# Patient Record
Sex: Female | Born: 1945 | Race: Black or African American | Hispanic: No | Smoking: Current every day smoker
Health system: Southern US, Community
[De-identification: ages and names within clinical notes are randomized; demographics above are authoritative.]

## PROBLEM LIST (undated history)

## (undated) DIAGNOSIS — I10 Essential (primary) hypertension: Secondary | ICD-10-CM

## (undated) DIAGNOSIS — E119 Type 2 diabetes mellitus without complications: Secondary | ICD-10-CM

## (undated) DIAGNOSIS — I358 Other nonrheumatic aortic valve disorders: Secondary | ICD-10-CM

## (undated) DIAGNOSIS — I517 Cardiomegaly: Secondary | ICD-10-CM

## (undated) DIAGNOSIS — N184 Chronic kidney disease, stage 4 (severe): Secondary | ICD-10-CM

## (undated) DIAGNOSIS — I5189 Other ill-defined heart diseases: Secondary | ICD-10-CM

## (undated) DIAGNOSIS — N189 Chronic kidney disease, unspecified: Secondary | ICD-10-CM

## (undated) HISTORY — DX: Other ill-defined heart diseases: I51.89

## (undated) HISTORY — DX: Chronic kidney disease, unspecified: N18.9

## (undated) HISTORY — PX: BREAST SURGERY: SHX581

## (undated) HISTORY — PX: APPENDECTOMY: SHX54

## (undated) HISTORY — DX: Essential (primary) hypertension: I10

## (undated) HISTORY — DX: Type 2 diabetes mellitus without complications: E11.9

## (undated) HISTORY — DX: Chronic kidney disease, stage 4 (severe): N18.4

## (undated) HISTORY — DX: Other nonrheumatic aortic valve disorders: I35.8

## (undated) HISTORY — DX: Cardiomegaly: I51.7

## (undated) HISTORY — PX: THYROID SURGERY: SHX805

## (undated) HISTORY — PX: CARPAL TUNNEL RELEASE: SHX101

## (undated) HISTORY — PX: REPLACEMENT TOTAL KNEE: SUR1224

---

## 2021-05-15 ENCOUNTER — Observation Stay (HOSPITAL_COMMUNITY)
Admission: EM | Admit: 2021-05-15 | Discharge: 2021-05-16 | Disposition: A | Discharge status: Home / Self Care | Payer: Medicare HMO | Attending: Emergency Medicine | Admitting: Emergency Medicine

## 2021-05-15 ENCOUNTER — Ambulatory Visit (INDEPENDENT_AMBULATORY_CARE_PROVIDER_SITE_OTHER): Payer: Medicare HMO | Admitting: Nurse Practitioner

## 2021-05-15 ENCOUNTER — Other Ambulatory Visit: Payer: Self-pay

## 2021-05-15 ENCOUNTER — Encounter: Payer: Self-pay | Admitting: Nurse Practitioner

## 2021-05-15 ENCOUNTER — Encounter (HOSPITAL_COMMUNITY): Payer: Self-pay | Admitting: *Deleted

## 2021-05-15 ENCOUNTER — Emergency Department (HOSPITAL_COMMUNITY): Payer: Medicare HMO

## 2021-05-15 DIAGNOSIS — R7989 Other specified abnormal findings of blood chemistry: Secondary | ICD-10-CM | POA: Diagnosis not present

## 2021-05-15 DIAGNOSIS — I10 Essential (primary) hypertension: Secondary | ICD-10-CM

## 2021-05-15 DIAGNOSIS — R2681 Unsteadiness on feet: Secondary | ICD-10-CM | POA: Insufficient documentation

## 2021-05-15 DIAGNOSIS — R519 Headache, unspecified: Secondary | ICD-10-CM | POA: Diagnosis not present

## 2021-05-15 DIAGNOSIS — E119 Type 2 diabetes mellitus without complications: Secondary | ICD-10-CM

## 2021-05-15 DIAGNOSIS — I169 Hypertensive crisis, unspecified: Secondary | ICD-10-CM | POA: Diagnosis not present

## 2021-05-15 DIAGNOSIS — I129 Hypertensive chronic kidney disease with stage 1 through stage 4 chronic kidney disease, or unspecified chronic kidney disease: Secondary | ICD-10-CM | POA: Insufficient documentation

## 2021-05-15 DIAGNOSIS — Z20822 Contact with and (suspected) exposure to covid-19: Secondary | ICD-10-CM | POA: Diagnosis not present

## 2021-05-15 DIAGNOSIS — Z720 Tobacco use: Secondary | ICD-10-CM | POA: Diagnosis present

## 2021-05-15 DIAGNOSIS — F17210 Nicotine dependence, cigarettes, uncomplicated: Secondary | ICD-10-CM | POA: Insufficient documentation

## 2021-05-15 DIAGNOSIS — E1122 Type 2 diabetes mellitus with diabetic chronic kidney disease: Secondary | ICD-10-CM | POA: Insufficient documentation

## 2021-05-15 DIAGNOSIS — F17200 Nicotine dependence, unspecified, uncomplicated: Secondary | ICD-10-CM | POA: Diagnosis present

## 2021-05-15 DIAGNOSIS — G9341 Metabolic encephalopathy: Secondary | ICD-10-CM | POA: Diagnosis present

## 2021-05-15 DIAGNOSIS — N1832 Chronic kidney disease, stage 3b: Secondary | ICD-10-CM | POA: Diagnosis present

## 2021-05-15 DIAGNOSIS — E010 Iodine-deficiency related diffuse (endemic) goiter: Secondary | ICD-10-CM | POA: Diagnosis present

## 2021-05-15 LAB — CBC WITH DIFFERENTIAL/PLATELET
Abs Immature Granulocytes: 0.01 10*3/uL (ref 0.00–0.07)
Basophils Absolute: 0 10*3/uL (ref 0.0–0.1)
Basophils Relative: 1 %
Eosinophils Absolute: 0.1 10*3/uL (ref 0.0–0.5)
Eosinophils Relative: 2 %
HCT: 44 % (ref 36.0–46.0)
Hemoglobin: 13 g/dL (ref 12.0–15.0)
Immature Granulocytes: 0 %
Lymphocytes Relative: 28 %
Lymphs Abs: 1.7 10*3/uL (ref 0.7–4.0)
MCH: 27.4 pg (ref 26.0–34.0)
MCHC: 29.5 g/dL — ABNORMAL LOW (ref 30.0–36.0)
MCV: 92.6 fL (ref 80.0–100.0)
Monocytes Absolute: 0.5 10*3/uL (ref 0.1–1.0)
Monocytes Relative: 8 %
Neutro Abs: 3.7 10*3/uL (ref 1.7–7.7)
Neutrophils Relative %: 61 %
Platelets: 215 10*3/uL (ref 150–400)
RBC: 4.75 MIL/uL (ref 3.87–5.11)
RDW: 13.8 % (ref 11.5–15.5)
WBC: 6 10*3/uL (ref 4.0–10.5)
nRBC: 0 % (ref 0.0–0.2)

## 2021-05-15 LAB — COMPREHENSIVE METABOLIC PANEL
ALT: 17 U/L (ref 0–44)
AST: 20 U/L (ref 15–41)
Albumin: 3.5 g/dL (ref 3.5–5.0)
Alkaline Phosphatase: 85 U/L (ref 38–126)
Anion gap: 6 (ref 5–15)
BUN: 20 mg/dL (ref 8–23)
CO2: 29 mmol/L (ref 22–32)
Calcium: 8.9 mg/dL (ref 8.9–10.3)
Chloride: 108 mmol/L (ref 98–111)
Creatinine, Ser: 1.65 mg/dL — ABNORMAL HIGH (ref 0.44–1.00)
GFR, Estimated: 32 mL/min — ABNORMAL LOW (ref >60)
Glucose, Bld: 112 mg/dL — ABNORMAL HIGH (ref 70–99)
Potassium: 4.3 mmol/L (ref 3.5–5.1)
Sodium: 143 mmol/L (ref 135–145)
Total Bilirubin: 0.1 mg/dL — ABNORMAL LOW (ref 0.3–1.2)
Total Protein: 6.7 g/dL (ref 6.5–8.1)

## 2021-05-15 MED ORDER — NICARDIPINE HCL IN NACL 20-0.86 MG/200ML-% IV SOLN
3.0000 mg/h | INTRAVENOUS | Status: DC
  Administered 2021-05-16: 5 mg/h via INTRAVENOUS
  Filled 2021-05-15: qty 200

## 2021-05-15 MED ORDER — HYDRALAZINE HCL 25 MG PO TABS
25.0000 mg | ORAL_TABLET | Freq: Once | ORAL | Status: AC
  Administered 2021-05-15: 25 mg via ORAL
  Filled 2021-05-15: qty 1

## 2021-05-15 MED ORDER — AMLODIPINE BESYLATE 5 MG PO TABS
5.0000 mg | ORAL_TABLET | Freq: Once | ORAL | Status: AC
  Administered 2021-05-15: 5 mg via ORAL
  Filled 2021-05-15: qty 1

## 2021-05-15 MED ORDER — CARVEDILOL 12.5 MG PO TABS: 12.5000 mg | ORAL_TABLET | Freq: Two times a day (BID) | ORAL | Status: DC

## 2021-05-15 MED ORDER — AMLODIPINE BESYLATE 5 MG PO TABS
10.0000 mg | ORAL_TABLET | Freq: Every day | ORAL | Status: DC
  Filled 2021-05-15: qty 2

## 2021-05-15 MED ORDER — LOSARTAN POTASSIUM 25 MG PO TABS: 50.0000 mg | ORAL_TABLET | Freq: Every day | ORAL | Status: DC

## 2021-05-15 MED ORDER — HYDRALAZINE HCL 25 MG PO TABS
50.0000 mg | ORAL_TABLET | Freq: Three times a day (TID) | ORAL | Status: DC
  Administered 2021-05-16: 50 mg via ORAL
  Filled 2021-05-15: qty 2

## 2021-05-15 MED ORDER — DIPHENHYDRAMINE HCL 50 MG/ML IJ SOLN
25.0000 mg | Freq: Once | INTRAMUSCULAR | Status: AC
  Administered 2021-05-16: 25 mg via INTRAVENOUS
  Filled 2021-05-15: qty 1

## 2021-05-15 MED ORDER — ACETAMINOPHEN 325 MG PO TABS
650.0000 mg | ORAL_TABLET | Freq: Four times a day (QID) | ORAL | Status: DC | PRN
  Administered 2021-05-15: 650 mg via ORAL
  Filled 2021-05-15: qty 2

## 2021-05-15 MED ORDER — METOCLOPRAMIDE HCL 5 MG/ML IJ SOLN
10.0000 mg | Freq: Once | INTRAMUSCULAR | Status: AC
  Administered 2021-05-16: 10 mg via INTRAVENOUS
  Filled 2021-05-15: qty 2

## 2021-05-15 MED ORDER — HYDRALAZINE HCL 20 MG/ML IJ SOLN
10.0000 mg | Freq: Once | INTRAMUSCULAR | Status: AC
  Administered 2021-05-15: 10 mg via INTRAVENOUS
  Filled 2021-05-15: qty 1

## 2021-05-15 NOTE — ED Triage Notes (Signed)
C/o headache , sent from PCP for evaluation of blood pressure. Patient has not ben taking medication and has just changed doctors

## 2021-05-15 NOTE — Progress Notes (Signed)
   New Patient Office Visit  Subjective:  Patient ID: Deborah Gomez, female    DOB: 30-Sep-1946  Age: 75 y.o. MRN: [id number]  CC:  Chief Complaint  Patient presents with   New Patient (Initial Visit)    Here to establish care, no complaints today, just needs refills.    HPI Deborah Gomez presents for new patient visit. Pt is having headaches and BP is significantly elevated.   Will complete new patient visit after her BP is stabilized. Recommend treatment at the Emergency Department today  No charge for the visit.    No past medical history on file.    No family history on file.  Social History   Socioeconomic History   Marital status: Widowed    Spouse name: Not on file   Number of children: Not on file   Years of education: Not on file   Highest education level: Not on file  Occupational History   Not on file  Tobacco Use   Smoking status: Every Day    Pack years: 0.00    Types: Cigarettes   Smokeless tobacco: Never  Substance and Sexual Activity   Alcohol use: Not on file   Drug use: Not on file   Sexual activity: Not on file  Other Topics Concern   Not on file  Social History Narrative   Not on file   Social Determinants of Health   Financial Resource Strain: Not on file  Food Insecurity: Not on file  Transportation Needs: Not on file  Physical Activity: Not on file  Stress: Not on file  Social Connections: Not on file  Intimate Partner Violence: Not on file    ROS Review of Systems  Objective:   Today's Vitals: BP (!) 232/92 (BP Location: Right Arm, Patient Position: Sitting, Cuff Size: Normal)   Pulse 85   Temp (!) 97.4 F (36.3 C) (Temporal)   Ht '5\' 4"'$  (1.626 m)   Wt 169 lb (76.7 kg)   SpO2 98%   BMI 29.01 kg/m   Physical Exam  Assessment & Plan:   Problem List Items Addressed This Visit   None   No outpatient encounter medications on file as of 05/15/2021.   No facility-administered encounter medications on file as of  05/15/2021.    Follow-up: No follow-ups on file.   Noreene Larsson, NP

## 2021-05-15 NOTE — ED Provider Notes (Signed)
[facility] Provider Note   CSN: [id number] Arrival date & time: 05/15/21  1437     History No chief complaint on file.   Deborah Gomez is a 75 y.o. female.  Pt reports she is not taking her blood pressure medication.  Pt reports she went to see an MD today.  Pt sent here for evaluation by primary care.  Pt complains of a headache.  Pt denies chest pain    Hypertension This is a new problem. The current episode started more than 1 week ago. The problem occurs constantly. Nothing aggravates the symptoms. Nothing relieves the symptoms. She has tried nothing for the symptoms. The treatment provided no relief.      Past Medical History:  Diagnosis Date  . Chronic kidney disease   . Diabetes mellitus without complication ([place])   . Hypertension     Patient Active Problem List   Diagnosis Date Noted  . HTN (hypertension), malignant 05/15/2021    Past Surgical History:  Procedure Laterality Date  . APPENDECTOMY    . REPLACEMENT TOTAL KNEE    . THYROID SURGERY       OB History   No obstetric history on file.     No family history on file.  Social History   Tobacco Use  . Smoking status: Every Day    Pack years: 0.00    Types: Cigarettes  . Smokeless tobacco: Never    Home Medications Prior to Admission medications   Not on File    Allergies    Patient has no allergy information on record.  Review of Systems   Review of Systems  All other systems reviewed and are negative.  Physical Exam Updated Vital Signs BP (!) 264/87 (BP Location: Right Arm)   Pulse 74   Temp 98.1 F (36.7 C)   Resp 16   Ht '5\' 4"'$  (1.626 m)   Wt 76.7 kg   SpO2 100%   BMI 29.01 kg/m   Physical Exam Vitals and nursing note reviewed.  Constitutional:      Appearance: She is well-developed.  HENT:     Head: Normocephalic.  Cardiovascular:     Rate and Rhythm: Normal rate and regular rhythm.  Pulmonary:     Effort: Pulmonary effort is normal.      Breath sounds: Normal breath sounds.  Abdominal:     General: There is no distension.  Musculoskeletal:        General: Normal range of motion.     Cervical back: Normal range of motion.  Neurological:     Mental Status: She is alert and oriented to person, place, and time.  Psychiatric:        Mood and Affect: Mood normal.    ED Results / Procedures / Treatments   Labs (all labs ordered are listed, but only abnormal results are displayed) Labs Reviewed  CBC WITH DIFFERENTIAL/PLATELET - Abnormal; Notable for the following components:      Result Value   MCHC 29.5 (*)    All other components within normal limits  COMPREHENSIVE METABOLIC PANEL - Abnormal; Notable for the following components:   Glucose, Bld 112 (*)    Creatinine, Ser 1.65 (*)    Total Bilirubin 0.1 (*)    GFR, Estimated 32 (*)    All other components within normal limits    EKG EKG Interpretation  Date/Time:  Monday May 15 2021 15:31:43 EDT Ventricular Rate:  62 PR Interval:  144 QRS Duration: 70 QT  Interval:  402 QTC Calculation: 408 R Axis:   -3 Text Interpretation: Normal sinus rhythm Cannot rule out Anterior infarct , age undetermined Abnormal ECG No previous ECGs available Confirmed by Fredia Sorrow [phone]) on 05/15/2021 5:46:20 PM  Radiology No results found.  Procedures Procedures   Medications Ordered in ED Medications  hydrALAZINE (APRESOLINE) tablet 25 mg (25 mg Oral Given 05/15/21 1710)    ED Course  I have reviewed the triage vital signs and the nursing notes.  Pertinent labs & imaging results that were available during my care of the patient were reviewed by me and considered in my medical decision making (see chart for details).    MDM Rules/Calculators/A&P                          MDM:   Final Clinical Impression(s) / ED Diagnoses Final diagnoses:  Hypertension, unspecified type  Acute nonintractable headache, unspecified headache type  Systolic essential hypertension     Rx / DC Orders ED Discharge Orders     None        Fransico Meadow, [place] 05/16/21 [id number]    Fredia Sorrow, MD 05/18/21 1600

## 2021-05-15 NOTE — Discharge Instructions (Addendum)
IMPORTANT INFORMATION: PAY CLOSE ATTENTION   PHYSICIAN DISCHARGE INSTRUCTIONS  Follow with Primary care provider  Noreene Larsson, NP  and other consultants as instructed by your Hospitalist Physician  [place] IF SYMPTOMS COME BACK, WORSEN OR NEW PROBLEM DEVELOPS   Please note: You were cared for by a hospitalist during your hospital stay. Every effort will be made to forward records to your primary care provider.  You can request that your primary care provider send for your hospital records if they have not received them.  Once you are discharged, your primary care physician will handle any further medical issues. Please note that NO REFILLS for any discharge medications will be authorized once you are discharged, as it is imperative that you return to your primary care physician (or establish a relationship with a primary care physician if you do not have one) for your post hospital discharge needs so that they can reassess your need for medications and monitor your lab values.  Please get a complete blood count and chemistry panel checked by your Primary MD at your next visit, and again as instructed by your Primary MD.  Get Medicines reviewed and adjusted: Please take all your medications with you for your next visit with your Primary MD  Laboratory/radiological data: Please request your Primary MD to go over all hospital tests and procedure/radiological results at the follow up, please ask your primary care provider to get all Hospital records sent to his/her office.  In some cases, they will be blood work, cultures and biopsy results pending at the time of your discharge. Please request that your primary care provider follow up on these results.  If you are diabetic, please bring your blood sugar readings with you to your follow up appointment with primary care.    Please call and make your follow up appointments as soon as possible.    Also Note  the following: If you experience worsening of your admission symptoms, develop shortness of breath, life threatening emergency, suicidal or homicidal thoughts you must seek medical attention immediately by calling 911 or calling your MD immediately  if symptoms less severe.  You must read complete instructions/literature along with all the possible adverse reactions/side effects for all the Medicines you take and that have been prescribed to you. Take any new Medicines after you have completely understood and accpet all the possible adverse reactions/side effects.   Do not drive when taking Pain medications or sleeping medications (Benzodiazepines)  Do not take more than prescribed Pain, Sleep and Anxiety Medications. It is not advisable to combine anxiety,sleep and pain medications without talking with your primary care practitioner  Special Instructions: If you have smoked or chewed Tobacco  in the last 2 yrs please stop smoking, stop any regular Alcohol  and or any Recreational drug use.  Wear Seat belts while driving.  Do not drive if taking any narcotic, mind altering or controlled substances or recreational drugs or alcohol.

## 2021-05-15 NOTE — ED Provider Notes (Signed)
Emergency Medicine Provider Triage Evaluation Note  Deborah Gomez , a 75 y.o. female  was evaluated in triage.  Pt complains of high blood pressure.  Pt has not take blood pressure medications recently.  Pt went to [facility] primary and was sent here due to high blood pressure.  Pt reports she moved here and did not have an MD to fill her medications   Review of Systems  Positive: headahe Negative: Chest pain  Physical Exam  BP (!) 257/84 (BP Location: Right Arm)   Pulse 67   Temp 98.1 F (36.7 C) (Oral)   Resp 20   Ht '5\' 4"'$  (1.626 m)   Wt 76.7 kg   SpO2 96%   BMI 29.01 kg/m  Gen:   Awake, no distress   Resp:  Normal effort  MSK:   Moves extremities without difficulty  Other:    Medical Decision Making  Medically screening exam initiated at 4:10 PM.  Appropriate orders placed.  Deborah Gomez was informed that the remainder of the evaluation will be completed by another provider, this initial triage assessment does not replace that evaluation, and the importance of remaining in the ED until their evaluation is complete.     Fransico Meadow, [place] 05/15/21 1612    Fredia Sorrow, MD 05/18/21 [phone]

## 2021-05-15 NOTE — Assessment & Plan Note (Signed)
BP Readings from Last 1 Encounters:  05/15/21 (!) 232/92   -recommend getting treatment at ED today for extreme HTN

## 2021-05-16 DIAGNOSIS — E010 Iodine-deficiency related diffuse (endemic) goiter: Secondary | ICD-10-CM | POA: Diagnosis present

## 2021-05-16 DIAGNOSIS — N1832 Chronic kidney disease, stage 3b: Secondary | ICD-10-CM | POA: Diagnosis present

## 2021-05-16 DIAGNOSIS — I169 Hypertensive crisis, unspecified: Principal | ICD-10-CM

## 2021-05-16 DIAGNOSIS — R7989 Other specified abnormal findings of blood chemistry: Secondary | ICD-10-CM | POA: Diagnosis present

## 2021-05-16 DIAGNOSIS — G9341 Metabolic encephalopathy: Secondary | ICD-10-CM | POA: Diagnosis present

## 2021-05-16 DIAGNOSIS — Z720 Tobacco use: Secondary | ICD-10-CM | POA: Diagnosis present

## 2021-05-16 DIAGNOSIS — F17200 Nicotine dependence, unspecified, uncomplicated: Secondary | ICD-10-CM | POA: Diagnosis present

## 2021-05-16 DIAGNOSIS — E119 Type 2 diabetes mellitus without complications: Secondary | ICD-10-CM

## 2021-05-16 LAB — TROPONIN I (HIGH SENSITIVITY): Troponin I (High Sensitivity): 11 ng/L (ref <18)

## 2021-05-16 LAB — SARS CORONAVIRUS 2 (TAT 6-24 HRS): SARS Coronavirus 2: NEGATIVE

## 2021-05-16 LAB — TSH: TSH: 1.895 u[IU]/mL (ref 0.350–4.500)

## 2021-05-16 MED ORDER — HYDRALAZINE HCL 20 MG/ML IJ SOLN: 5.0000 mg | Freq: Four times a day (QID) | INTRAMUSCULAR | Status: DC | PRN

## 2021-05-16 MED ORDER — AMLODIPINE BESYLATE 5 MG PO TABS: 5.0000 mg | ORAL_TABLET | Freq: Every day | ORAL | 1 refills | Status: DC

## 2021-05-16 MED ORDER — CARVEDILOL 3.125 MG PO TABS: 3.1250 mg | ORAL_TABLET | Freq: Two times a day (BID) | ORAL | 1 refills | Status: DC

## 2021-05-16 MED ORDER — ACETAMINOPHEN 650 MG RE SUPP: 650.0000 mg | Freq: Four times a day (QID) | RECTAL | Status: DC | PRN

## 2021-05-16 MED ORDER — CARVEDILOL 3.125 MG PO TABS
6.2500 mg | ORAL_TABLET | Freq: Two times a day (BID) | ORAL | Status: DC
  Administered 2021-05-16: 6.25 mg via ORAL
  Filled 2021-05-16: qty 2

## 2021-05-16 MED ORDER — ONDANSETRON HCL 4 MG/2ML IJ SOLN: 4.0000 mg | Freq: Four times a day (QID) | INTRAMUSCULAR | Status: DC | PRN

## 2021-05-16 MED ORDER — ENOXAPARIN SODIUM 30 MG/0.3ML IJ SOSY
30.0000 mg | PREFILLED_SYRINGE | INTRAMUSCULAR | Status: DC
  Administered 2021-05-16: 30 mg via SUBCUTANEOUS
  Filled 2021-05-16: qty 0.3

## 2021-05-16 MED ORDER — NICOTINE 14 MG/24HR TD PT24
14.0000 mg | MEDICATED_PATCH | Freq: Every day | TRANSDERMAL | Status: DC
  Filled 2021-05-16: qty 1

## 2021-05-16 MED ORDER — LOSARTAN POTASSIUM 50 MG PO TABS
50.0000 mg | ORAL_TABLET | Freq: Every day | ORAL | Status: DC
  Administered 2021-05-16: 50 mg via ORAL
  Filled 2021-05-16: qty 2

## 2021-05-16 MED ORDER — AMLODIPINE BESYLATE 5 MG PO TABS
5.0000 mg | ORAL_TABLET | Freq: Every day | ORAL | Status: DC
  Administered 2021-05-16: 5 mg via ORAL

## 2021-05-16 MED ORDER — HYDRALAZINE HCL 10 MG PO TABS
10.0000 mg | ORAL_TABLET | Freq: Three times a day (TID) | ORAL | 1 refills | Status: DC
Start: 2021-05-16 — End: 2021-07-20

## 2021-05-16 MED ORDER — HYDRALAZINE HCL 25 MG PO TABS: 25.0000 mg | ORAL_TABLET | Freq: Three times a day (TID) | ORAL | Status: DC

## 2021-05-16 MED ORDER — ONDANSETRON HCL 4 MG PO TABS: 4.0000 mg | ORAL_TABLET | Freq: Four times a day (QID) | ORAL | Status: DC | PRN

## 2021-05-16 MED ORDER — HYDROCODONE-ACETAMINOPHEN 5-325 MG PO TABS: 1.0000 | ORAL_TABLET | Freq: Four times a day (QID) | ORAL | Status: DC | PRN

## 2021-05-16 MED ORDER — ACETAMINOPHEN 325 MG PO TABS: 650.0000 mg | ORAL_TABLET | Freq: Four times a day (QID) | ORAL | Status: DC | PRN

## 2021-05-16 MED ORDER — HYDRALAZINE HCL 20 MG/ML IJ SOLN
10.0000 mg | Freq: Once | INTRAMUSCULAR | Status: AC
  Administered 2021-05-16: 10 mg via INTRAVENOUS
  Filled 2021-05-16: qty 1

## 2021-05-16 NOTE — H&P (Signed)
[facility] H&P    Patient Demographics:    Deborah Gomez, is a 75 y.o. female  MRN: [id number]  DOB - [birth year]  Admit Date - 05/15/2021  Referring MD/NP/PA: Threasa Alpha  Outpatient Primary MD for the patient is Noreene Larsson, NP  Patient coming from: Home  Chief complaint- Hypertension   HPI:    Deborah Gomez  is a 75 y.o. female, with history of diet-controlled diabetes mellitus type 2, hypertension, CKD with unknown baseline, partial thyroidectomy following goiter per patient, tobacco use disorder, presents to the ED with a chief complaint of hypertension.  Patient had moved to this area sometime between 6 months and 1 year ago.  She has not had her blood pressure medication since she moved to the area as she did not have a physician to prescribe them.  She went to establish with a PCP today, and when they took her blood pressure they told her she needed to come to the ED.  Patient reports associated headache, blurry vision, dizziness, and nausea.  She denies any chest pain or shortness of breath.  She denies any fevers.  She reports that the symptoms started today.  She is otherwise been in her normal state of health.  Patient has no other complaints at this time.  Patient does report that she has had a headache today as above.  The headache is on the left temporal parietal region.  She reports its constant achy, pressure it was gradual in onset.  Tylenol was given in the ED and she reports that that did not help.  She reports moderate severity of this headache.  She does not normally get headaches so she cannot say if this 1 is like her normal.  Patient currently smokes quarter of a pack a day, does not drink alcohol, does not use illicit drugs, is not vaccinated for COVID.  Patient is full code.  In the ED Afebrile, heart rate 57, respiratory rate 14-20, maintaining O2 sats on room air, blood pressure as high as  264/87 No leukocytosis, hemoglobin 13.0 Chemistry panel is mostly unremarkable aside from a elevated creatinine of 1.  6 5 EKG shows a heart rate of 62, sinus rhythm, QTC 408 Patient was given p.o. amlodipine and hydralazine, blood pressure improved to 192/71 She was then given IV hydralazine, and blood pressure was then systolic [id number] Cardene drip started at admission with gradual increases, blood pressure normalized, patient admitted to telemetry CT head was done that showed no acute intracranial abnormality  At admission Reglan and Benadryl were given for headache and headache was resolved    Review of systems:    In addition to the HPI above,  No Fever-chills, Admits to headache and blurry vision No problems swallowing food or Liquids, No Chest pain, Cough or Shortness of Breath, No Abdominal pain, admits to nausea but no vomiting, bowel movements are regular, No Blood in stool or Urine, No dysuria, No new skin rashes or bruises, No new joints pains-aches,  No new weakness, tingling, numbness in any extremity, No recent weight gain  or loss, No polyuria, polydypsia or polyphagia, No significant Mental Stressors.  All other systems reviewed and are negative.    Past History of the following :    Past Medical History:  Diagnosis Date   Chronic kidney disease    Diabetes mellitus without complication ([place])    Hypertension       Past Surgical History:  Procedure Laterality Date   APPENDECTOMY     REPLACEMENT TOTAL KNEE     THYROID SURGERY        Social History:      Social History   Tobacco Use   Smoking status: Every Day    Pack years: 0.00    Types: Cigarettes   Smokeless tobacco: Never  Substance Use Topics   Alcohol use: Not on file       Family History :     Family history hypertension   Home Medications:   Prior to Admission medications   Not on File     Allergies:    Not on File   Physical Exam:   Vitals  Blood pressure (!)  152/54, pulse 65, temperature 98.3 F (36.8 C), temperature source Oral, resp. rate 16, height '5\' 4"'$  (1.626 m), weight 76.7 kg, SpO2 98 %.  1.  General: Patient lying supine in bed,  no acute distress   2. Psychiatric: Alert and oriented x 3, mood and behavior normal for situation, pleasant and cooperative with exam, difficulty remembering events prior to presentation in the ED   3. Neurologic: Speech and language are normal, face is symmetric, moves all 4 extremities voluntarily, some confusion but oriented x3, no focal deficits  4. HEENMT:  Head is atraumatic, normocephalic, pupils reactive to light, neck is supple, trachea is midline, mucous membranes are moist   5. Respiratory : Lungs are clear to auscultation bilaterally without wheezing, rhonchi, rales, no cyanosis, no increase in work of breathing or accessory muscle use   6. Cardiovascular : Heart rate normal, rhythm is regular, systolic murmur, no rubs or gallops, no peripheral edema, peripheral pulses palpated   7. Gastrointestinal:  Abdomen is soft, nondistended, nontender to palpation bowel sounds active, no masses or organomegaly palpated   8. Skin:  Skin is warm, dry and intact without rashes, acute lesions, or ulcers on limited exam   9.Musculoskeletal:  No acute deformities or trauma, no asymmetry in tone, no peripheral edema, peripheral pulses palpated, no tenderness to palpation in the extremities    Data Review:    CBC Recent Labs  Lab 05/15/21 1622  WBC 6.0  HGB 13.0  HCT 44.0  PLT 215  MCV 92.6  MCH 27.4  MCHC 29.5*  RDW 13.8  LYMPHSABS 1.7  MONOABS 0.5  EOSABS 0.1  BASOSABS 0.0   ------------------------------------------------------------------------------------------------------------------  Results for orders placed or performed during the hospital encounter of 05/15/21 (from the past 48 hour(s))  CBC with Differential/Platelet     Status: Abnormal   Collection Time: 05/15/21  4:22 PM   Result Value Ref Range   WBC 6.0 4.0 - 10.5 K/uL   RBC 4.75 3.87 - 5.11 MIL/uL   Hemoglobin 13.0 12.0 - 15.0 g/dL   HCT 44.0 36.0 - 46.0 %   MCV 92.6 80.0 - 100.0 fL   MCH 27.4 26.0 - 34.0 pg   MCHC 29.5 (L) 30.0 - 36.0 g/dL   RDW 13.8 11.5 - 15.5 %   Platelets 215 150 - 400 K/uL   nRBC 0.0 0.0 - 0.2 %   Neutrophils  Relative % 61 %   Neutro Abs 3.7 1.7 - 7.7 K/uL   Lymphocytes Relative 28 %   Lymphs Abs 1.7 0.7 - 4.0 K/uL   Monocytes Relative 8 %   Monocytes Absolute 0.5 0.1 - 1.0 K/uL   Eosinophils Relative 2 %   Eosinophils Absolute 0.1 0.0 - 0.5 K/uL   Basophils Relative 1 %   Basophils Absolute 0.0 0.0 - 0.1 K/uL   Immature Granulocytes 0 %   Abs Immature Granulocytes 0.01 0.00 - 0.07 K/uL    Comment: Performed at [facility], [address] [place], [place] [address]  Comprehensive metabolic panel     Status: Abnormal   Collection Time: 05/15/21  4:22 PM  Result Value Ref Range   Sodium 143 135 - 145 mmol/L   Potassium 4.3 3.5 - 5.1 mmol/L   Chloride 108 98 - 111 mmol/L   CO2 29 22 - 32 mmol/L   Glucose, Bld 112 (H) 70 - 99 mg/dL    Comment: Glucose reference range applies only to samples taken after fasting for at least 8 hours.   BUN 20 8 - 23 mg/dL   Creatinine, Ser 1.65 (H) 0.44 - 1.00 mg/dL   Calcium 8.9 8.9 - 10.3 mg/dL   Total Protein 6.7 6.5 - 8.1 g/dL   Albumin 3.5 3.5 - 5.0 g/dL   AST 20 15 - 41 U/L   ALT 17 0 - 44 U/L   Alkaline Phosphatase 85 38 - 126 U/L   Total Bilirubin 0.1 (L) 0.3 - 1.2 mg/dL   GFR, Estimated 32 (L) >60 mL/min    Comment: (NOTE) Calculated using the CKD-EPI Creatinine Equation (2021)    Anion gap 6 5 - 15    Comment: Performed at [facility], [address], [place], [place] [address]    Chemistries  Recent Labs  Lab 05/15/21 1622  NA 143  K 4.3  CL 108  CO2 29  GLUCOSE 112*  BUN 20  CREATININE 1.65*  CALCIUM 8.9  AST 20  ALT 17  ALKPHOS 85  BILITOT 0.1*    ------------------------------------------------------------------------------------------------------------------  ------------------------------------------------------------------------------------------------------------------ GFR: Estimated Creatinine Clearance: 29.5 mL/min (A) (by C-G formula based on SCr of 1.65 mg/dL (H)). Liver Function Tests: Recent Labs  Lab 05/15/21 1622  AST 20  ALT 17  ALKPHOS 85  BILITOT 0.1*  PROT 6.7  ALBUMIN 3.5   No results for input(s): LIPASE, AMYLASE in the last 168 hours. No results for input(s): AMMONIA in the last 168 hours. Coagulation Profile: No results for input(s): INR, PROTIME in the last 168 hours. Cardiac Enzymes: No results for input(s): CKTOTAL, CKMB, CKMBINDEX, TROPONINI in the last 168 hours. BNP (last 3 results) No results for input(s): PROBNP in the last 8760 hours. HbA1C: No results for input(s): HGBA1C in the last 72 hours. CBG: No results for input(s): GLUCAP in the last 168 hours. Lipid Profile: No results for input(s): CHOL, HDL, LDLCALC, TRIG, CHOLHDL, LDLDIRECT in the last 72 hours. Thyroid Function Tests: No results for input(s): TSH, T4TOTAL, FREET4, T3FREE, THYROIDAB in the last 72 hours. Anemia Panel: No results for input(s): VITAMINB12, FOLATE, FERRITIN, TIBC, IRON, RETICCTPCT in the last 72 hours.  --------------------------------------------------------------------------------------------------------------- Urine analysis: No results found for: COLORURINE, APPEARANCEUR, LABSPEC, PHURINE, GLUCOSEU, HGBUR, BILIRUBINUR, KETONESUR, PROTEINUR, UROBILINOGEN, NITRITE, LEUKOCYTESUR    Imaging Results:    CT Head Wo Contrast  Result Date: 05/15/2021 CLINICAL DATA:  Headache EXAM: CT HEAD WITHOUT CONTRAST TECHNIQUE: Contiguous axial images were obtained from the base of  the skull through the vertex without intravenous contrast. COMPARISON:  None. FINDINGS: Brain: There is atrophy and chronic small vessel  disease changes. No acute intracranial abnormality. Specifically, no hemorrhage, hydrocephalus, mass lesion, acute infarction, or significant intracranial injury. Vascular: No hyperdense vessel or unexpected calcification. Skull: No acute calvarial abnormality. Sinuses/Orbits: Visualized paranasal sinuses and mastoids clear. Orbital soft tissues unremarkable. Other: None IMPRESSION: Atrophy, chronic microvascular disease. No acute intracranial abnormality. Electronically Signed   By: Rolm Baptise M.D.   On: 05/15/2021 21:56        Assessment & Plan:    Principal Problem:   Hypertensive crisis Active Problems:   Acute metabolic encephalopathy   DMII (diabetes mellitus, type 2) (HCC)   Elevated serum creatinine   Thyromegaly   Tobacco use disorder   Hypertensive crisis BP as high as 264/87 Medication non compliance Associated headache and confusion Amlodipine and hydralazine given PO - with only very temporary improvement of BP Cardene drip started temporarily in ED and improved BP to 150s Trop pending EKG without ischemic changes Cr elevated - but may be baseline Patient does not know home medications - she does know that she is on multiple BP meds Beta blocker, hydralazine, CCB, and ARB sent Continue to monitor Acute metabolic encephalopathy Patient is alert and oriented x 3 She is having difficulty talking events prior to presentation at ED repeating " I don't know why I am confused" Most likely 2/2 HTN CT head is without acute intracranial abnormality Continue to monitor DMII Not on medications currently Glucose controlled at 112 Trend on AM chemistry Elevated serum cr Cr elevated to 1.65 Patient knows she has CKD, but does not know her baseline Cr Will control BP and trend in the AM Continue to monitor 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

## 2021-05-16 NOTE — Evaluation (Signed)
Physical Therapy Evaluation Patient Details Name: Deborah Gomez MRN: [id number] DOB: 01-22-1946 Today's Date: 05/16/2021   History of Present Illness  Deborah Gomez  is a 75 y.o. female, with history of diet-controlled diabetes mellitus type 2, hypertension, CKD with unknown baseline, partial thyroidectomy following goiter per patient, tobacco use disorder, presents to the ED with a chief complaint of hypertension.  Patient had moved to this area sometime between 6 months and 1 year ago.  She has not had her blood pressure medication since she moved to the area as she did not have a physician to prescribe them.  She went to establish with a PCP today, and when they took her blood pressure they told her she needed to come to the ED.  Patient reports associated headache, blurry vision, dizziness, and nausea.  She denies any chest pain or shortness of breath.  She denies any fevers.  She reports that the symptoms started today.  She is otherwise been in her normal state of health.  Patient has no other complaints at this time.   Clinical Impression  Patient functioning near baseline for functional mobility and gait. Patient able to complete bed mobility and transfer independently without AD. She ambulates with slightly slow, unsteady cadence without use of AD but does not have loss of balance. Patient returned to bed at end of session. Patient discharged to care of nursing for ambulation daily as tolerated for length of stay.     Follow Up Recommendations No PT follow up    Equipment Recommendations  None recommended by PT    Recommendations for Other Services       Precautions / Restrictions Precautions Precautions: Fall Restrictions Weight Bearing Restrictions: No      Mobility  Bed Mobility Overal bed mobility: Independent                  Transfers Overall transfer level: Independent Equipment used: None             General transfer comment: slightly unsteady  upon initial standing  Ambulation/Gait Ambulation/Gait assistance: Min guard Gait Distance (Feet): 120 Feet Assistive device: None Gait Pattern/deviations: Step-through pattern;Decreased stride length     General Gait Details: slightly slow, unsteady cadence without AD; no loss of balance  Stairs            Wheelchair Mobility    Modified Rankin (Stroke Patients Only)       Balance Overall balance assessment: Needs assistance Sitting-balance support: No upper extremity supported Sitting balance-Leahy Scale: Normal Sitting balance - Comments: seated EOB   Standing balance support: No upper extremity supported Standing balance-Leahy Scale: Fair Standing balance comment: without AD                             Pertinent Vitals/Pain Pain Assessment: No/denies pain    Home Living Family/patient expects to be discharged to:: Private residence Living Arrangements: Alone Available Help at Discharge: Family Type of Home: Apartment Home Access: Level entry     Home Layout: One level Home Equipment: Environmental consultant - 2 wheels;Cane - single point      Prior Function Level of Independence: Independent         Comments: Patient states community ambulation, independent with ADL     Hand Dominance        Extremity/Trunk Assessment   Upper Extremity Assessment Upper Extremity Assessment: Overall WFL for tasks assessed    Lower Extremity Assessment Lower Extremity  Assessment: Overall WFL for tasks assessed    Cervical / Trunk Assessment Cervical / Trunk Assessment: Normal  Communication   Communication: No difficulties  Cognition Arousal/Alertness: Awake/alert Behavior During Therapy: WFL for tasks assessed/performed Overall Cognitive Status: Within Functional Limits for tasks assessed                                        General Comments      Exercises     Assessment/Plan    PT Assessment Patent does not need any further  PT services  PT Problem List         PT Treatment Interventions      PT Goals (Current goals can be found in the Care Plan section)  Acute Rehab PT Goals Patient Stated Goal: Return home PT Goal Formulation: With patient Time For Goal Achievement: 05/16/21 Potential to Achieve Goals: Good    Frequency     Barriers to discharge        Co-evaluation               AM-PAC PT "6 Clicks" Mobility  Outcome Measure Help needed turning from your back to your side while in a flat bed without using bedrails?: None Help needed moving from lying on your back to sitting on the side of a flat bed without using bedrails?: None Help needed moving to and from a bed to a chair (including a wheelchair)?: None Help needed standing up from a chair using your arms (e.g., wheelchair or bedside chair)?: None Help needed to walk in hospital room?: A Little Help needed climbing 3-5 steps with a railing? : A Little 6 Click Score: 22    End of Session Equipment Utilized During Treatment: Gait belt Activity Tolerance: Patient tolerated treatment well Patient left: in bed;with call bell/phone within reach Nurse Communication: Mobility status PT Visit Diagnosis: Other abnormalities of gait and mobility (R26.89);Unsteadiness on feet (R26.81)    Time: [id number] PT Time Calculation (min) (ACUTE ONLY): 11 min   Charges:   PT Evaluation $PT Eval Low Complexity: 1 Low          8:36 AM, 05/16/21 Mearl Latin PT, DPT Physical Therapist at [facility]

## 2021-05-16 NOTE — Discharge Summary (Addendum)
Physician Discharge Summary  Deborah Gomez [id number] DOB: 1946-09-16 DOA: 05/15/2021  PCP: Noreene Larsson, NP  Admit date: 05/15/2021 Discharge date: 05/16/2021  Admitted From:  Home  Disposition: Home   Recommendations for Outpatient Follow-up:  Follow up with PCP in 3-5 days to check BP Please adjust and titrated BP meds as needed  Please establish care with Dr. Theador Hawthorne regarding CKD in next 1 month Please obtain BMP in 1-2 weeks to follow renal function and electrolytes  Discharge Condition: STABLE   CODE STATUS: FULL DIET: heart healthy/carb modified recommended   Brief Hospitalization Summary: Please see all hospital notes, images, labs for full details of the hospitalization. ADMISSION HPI:  Deborah Gomez  is a 75 y.o. female, with history of diet-controlled diabetes mellitus type 2, hypertension, CKD with unknown baseline, partial thyroidectomy following goiter per patient, tobacco use disorder, presents to the ED with a chief complaint of hypertension.  Patient had moved to this area sometime between 6 months and 1 year ago.  She has not had her blood pressure medication since she moved to the area as she did not have a physician to prescribe them.  She went to establish with a PCP today, and when they took her blood pressure they told her she needed to come to the ED.  Patient reports associated headache, blurry vision, dizziness, and nausea.  She denies any chest pain or shortness of breath.  She denies any fevers.  She reports that the symptoms started today.  She is otherwise been in her normal state of health.  Patient has no other complaints at this time.   Patient does report that she has had a headache today as above.  The headache is on the left temporal parietal region.  She reports its constant achy, pressure it was gradual in onset.  Tylenol was given in the ED and she reports that that did not help.  She reports moderate severity of this headache.  She does not  normally get headaches so she cannot say if this 1 is like her normal.   Patient currently smokes quarter of a pack a day, does not drink alcohol, does not use illicit drugs, is not vaccinated for COVID.  Patient is full code.   In the ED Afebrile, heart rate 57, respiratory rate 14-20, maintaining O2 sats on room air, blood pressure as high as 264/87 No leukocytosis, hemoglobin 13.0 Chemistry panel is mostly unremarkable aside from a elevated creatinine of 1.  6 5 EKG shows a heart rate of 62, sinus rhythm, QTC 408 Patient was given p.o. amlodipine and hydralazine, blood pressure improved to 192/71 She was then given IV hydralazine, and blood pressure was then systolic [id number] Cardene drip started at admission with gradual increases, blood pressure normalized, patient admitted to telemetry CT head was done that showed no acute intracranial abnormality   At admission Reglan and Benadryl were given for headache and headache was resolved.    HOSPITAL COURSE  Pt was admitted with hypertensive urgency and was briefly on Cardene infusion.  She was subsequently transitioned over to oral blood pressure lowering medications with good results and blood pressures much better controlled now.  She has also noted to have what looks like a chronic kidney disease likely exacerbated by years of poorly controlled diabetes mellitus and hypertension.  She reports that her diabetes has been diet controlled.  She has not taken blood pressure medications and approximately 6 months or longer.  She recently establish care with a primary  care clinic.  She is stable to discharge home today with outpatient follow-up with her PCP.  I have also recommended that she establish care with Dr. Theador Hawthorne for chronic kidney disease.  In addition she was strongly advised to stop smoking cigarettes and using all tobacco products.  The patient was seen by physical therapy and ambulated very well and no PT follow-up recommendations were given.   Patient is stable to discharge home and have outpatient follow-up as recommended.  Of note, she had a normal TSH screening test.    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   carvedilol 3.125 MG tablet Commonly known as: COREG Take 1 tablet (3.125 mg total) by mouth 2 (two) times daily with a meal.   hydrALAZINE 10 MG tablet Commonly known as: APRESOLINE Take 1 tablet (10 mg total) by mouth 3 (three) times daily.        Follow-up Information     Noreene Larsson, NP. Schedule an appointment as soon as possible for a visit in 3 day(s).   Specialty: Nurse Practitioner Why: Hospital Follow Up and BP check up Contact information: [place] [address] [phone]         Liana Gerold, MD. Schedule an appointment as soon as possible for a visit in 2 week(s).   Specialty: Nephrology Why: Establish care regarding chronic kidney disease, Hospital Follow Up Contact information: [address] [address] [phone]                Not on File Allergies as of 05/16/2021   Not on File      Medication List     TAKE these medications    amLODipine 5 MG tablet Commonly known as: NORVASC Take 1 tablet (5 mg total) by mouth daily.   carvedilol 3.125 MG tablet Commonly known as: COREG Take 1 tablet (3.125 mg total) by mouth 2 (two) times daily with a meal.   hydrALAZINE 10 MG tablet Commonly known as: APRESOLINE Take 1 tablet (10  mg total) by mouth 3 (three) times daily.        Procedures/Studies: CT Head Wo Contrast  Result Date: 05/15/2021 CLINICAL DATA:  Headache EXAM: CT HEAD WITHOUT CONTRAST TECHNIQUE: Contiguous axial images were obtained from the base of the skull through the vertex without intravenous contrast. COMPARISON:  None. FINDINGS: Brain: There is atrophy and chronic small vessel disease changes. No acute intracranial abnormality. Specifically, no hemorrhage, hydrocephalus, mass lesion, acute infarction, or significant intracranial injury. Vascular: No hyperdense vessel or unexpected calcification. Skull: No acute calvarial abnormality. Sinuses/Orbits: Visualized paranasal sinuses and mastoids clear. Orbital soft tissues unremarkable. Other: None IMPRESSION: Atrophy, chronic microvascular disease. No acute intracranial abnormality. Electronically Signed   By: Rolm Baptise M.D.   On: 05/15/2021 21:56     Subjective: Pt says she is feeling better, she ambulated with PT, she says she has insurance coverage to obtain medications.  Discharge Exam: Vitals:   05/16/21 0330 05/16/21 0409  BP: (!) 161/65 127/65  Pulse: 76 68  Resp: 18 18  Temp:  97.8 F (36.6 C)  SpO2: 99% 100%   Vitals:   05/16/21 0145 05/16/21 0300 05/16/21 0330 05/16/21 0409  BP: (!) 152/54 (!) 192/69 (!) 161/65 127/65  Pulse: 65 66 76 68  Resp: '16 18 18 18  '$ Temp:    97.8 F (36.6 C)  TempSrc:    Oral  SpO2: 98% 99% 99% 100%  Weight:      Height:       General: Pt is alert, awake, not in acute distress Cardiovascular: RRR, S1/S2 +, no rubs, no gallops Respiratory: CTA bilaterally, no wheezing, no rhonchi Abdominal: Soft, NT, ND, bowel sounds + Extremities: no edema, no cyanosis Neurological:nonfocal exam. Oriented x 3.    The results of significant diagnostics from this hospitalization (including imaging, microbiology, ancillary and laboratory) are listed below for reference.     Microbiology: No results found for  this or any previous visit (from the past 240 hour(s)).   Labs: BNP (last 3 results) No results for input(s): BNP in the last 8760 hours. Basic Metabolic Panel: Recent Labs  Lab 05/15/21 1622  NA 143  K 4.3  CL 108  CO2 29  GLUCOSE 112*  BUN 20  CREATININE 1.65*  CALCIUM 8.9   Liver Function Tests: Recent Labs  Lab 05/15/21 1622  AST 20  ALT 17  ALKPHOS 85  BILITOT 0.1*  PROT 6.7  ALBUMIN 3.5   No results for input(s): LIPASE, AMYLASE in the last 168 hours. No results for input(s): AMMONIA in the last 168 hours. CBC: Recent Labs  Lab 05/15/21 1622  WBC 6.0  NEUTROABS 3.7  HGB 13.0  HCT 44.0  MCV 92.6  PLT 215   Cardiac Enzymes: No results for input(s): CKTOTAL, CKMB, CKMBINDEX, TROPONINI in the last 168 hours. BNP: Invalid input(s): POCBNP CBG: No results for input(s): GLUCAP in the last 168 hours. D-Dimer No results for input(s): DDIMER in the last 72 hours. Hgb A1c No results for input(s): HGBA1C in the last 72 hours. Lipid Profile No results for input(s): CHOL, HDL, LDLCALC, TRIG, CHOLHDL, LDLDIRECT in the last 72 hours. Thyroid function studies Recent Labs    05/16/21 0216  TSH 1.895   Anemia work up No results for input(s): VITAMINB12, FOLATE, FERRITIN, TIBC, IRON, RETICCTPCT in the last 72 hours. Urinalysis No results found for: COLORURINE, APPEARANCEUR, LABSPEC, [place], GLUCOSEU, HGBUR, BILIRUBINUR, KETONESUR, PROTEINUR, UROBILINOGEN, NITRITE, LEUKOCYTESUR Sepsis Labs Invalid input(s): PROCALCITONIN,  WBC,  LACTICIDVEN Microbiology No results found for this or any previous visit (from the past 240 hour(s)).  Time coordinating discharge:   SIGNED:  Irwin Brakeman, MD  [facility] 05/16/2021, 8:53 AM How to contact the [facility] Attending or Consulting provider [place] or covering provider during after hours [place], for this patient?  Check the care team in [facility] and look for a) attending/consulting [facility] provider listed and b) the [facility]  team listed Log into [email or web address] and use [place]'s universal password to access. If you do not have the password, please contact the hospital operator. Locate the [facility] provider you are looking for under [facility] and page to a number that you can be directly reached. If you still have difficulty reaching the provider, please page the [facility] (Director on Call) for the Hospitalists listed on amion for assistance.

## 2021-05-16 NOTE — Care Management CC44 (Signed)
Condition Code 44 Documentation Completed  Patient Details  Name: Deborah Gomez MRN: [id number] Date of Birth: 03/17/1946   Condition Code 44 given:  Yes Patient signature on Condition Code 44 notice:  Yes Documentation of 2 MD's agreement:  Yes Code 44 added to claim:  Yes    Shade Flood, LCSW 05/16/2021, 9:01 AM

## 2021-05-16 NOTE — Care Management Obs Status (Signed)
[place] NOTIFICATION   Patient Details  Name: Deborah Gomez MRN: [id number] Date of Birth: 1946-05-06   Medicare Observation Status Notification Given:  Yes    Shade Flood, LCSW 05/16/2021, 9:01 AM

## 2021-05-17 LAB — HEMOGLOBIN A1C
Hgb A1c MFr Bld: 6.4 % — ABNORMAL HIGH (ref 4.8–5.6)
Mean Plasma Glucose: 137 mg/dL

## 2021-06-06 ENCOUNTER — Ambulatory Visit (INDEPENDENT_AMBULATORY_CARE_PROVIDER_SITE_OTHER): Payer: Medicare HMO | Admitting: Nurse Practitioner

## 2021-06-06 ENCOUNTER — Ambulatory Visit (INDEPENDENT_AMBULATORY_CARE_PROVIDER_SITE_OTHER): Payer: Medicare HMO | Admitting: *Deleted

## 2021-06-06 ENCOUNTER — Encounter: Payer: Self-pay | Admitting: Nurse Practitioner

## 2021-06-06 ENCOUNTER — Other Ambulatory Visit: Payer: Self-pay

## 2021-06-06 VITALS — BP 180/73 | HR 73 | Temp 97.7°F | Ht 64.0 in | Wt 167.0 lb

## 2021-06-06 DIAGNOSIS — E559 Vitamin D deficiency, unspecified: Secondary | ICD-10-CM | POA: Diagnosis not present

## 2021-06-06 DIAGNOSIS — E611 Iron deficiency: Secondary | ICD-10-CM

## 2021-06-06 DIAGNOSIS — E010 Iodine-deficiency related diffuse (endemic) goiter: Secondary | ICD-10-CM

## 2021-06-06 DIAGNOSIS — I10 Essential (primary) hypertension: Secondary | ICD-10-CM | POA: Diagnosis not present

## 2021-06-06 DIAGNOSIS — N184 Chronic kidney disease, stage 4 (severe): Secondary | ICD-10-CM

## 2021-06-06 DIAGNOSIS — N1832 Chronic kidney disease, stage 3b: Secondary | ICD-10-CM | POA: Diagnosis not present

## 2021-06-06 DIAGNOSIS — E1122 Type 2 diabetes mellitus with diabetic chronic kidney disease: Secondary | ICD-10-CM

## 2021-06-06 DIAGNOSIS — E785 Hyperlipidemia, unspecified: Secondary | ICD-10-CM | POA: Diagnosis not present

## 2021-06-06 DIAGNOSIS — Z0000 Encounter for general adult medical examination without abnormal findings: Secondary | ICD-10-CM

## 2021-06-06 MED ORDER — AMLODIPINE BESYLATE 10 MG PO TABS: 10.0000 mg | ORAL_TABLET | Freq: Every day | ORAL | 3 refills | Status: DC

## 2021-06-06 NOTE — Assessment & Plan Note (Signed)
-  has been taking weekly vit D supplement

## 2021-06-06 NOTE — Assessment & Plan Note (Signed)
-  will check iron panel with labs

## 2021-06-06 NOTE — Assessment & Plan Note (Signed)
-  had previous thyroid surgery -requesting records

## 2021-06-06 NOTE — Assessment & Plan Note (Signed)
-  has upcoming appointment with nephrology

## 2021-06-06 NOTE — Assessment & Plan Note (Addendum)
-  takes metformin 500 mg BID for glycemic control -has bilateral neuropathy -takes gabapentin TID and tramadol PRN for neuropathy -on pravastatin and losartan -goal A1c < 7

## 2021-06-06 NOTE — Patient Instructions (Signed)
Deborah Gomez , Thank you for taking time to come for your Medicare Wellness Visit. I appreciate your ongoing commitment to your health goals. Please review the following plan we discussed and let me know if I can assist you in the future.   Screening recommendations/referrals:  Mammogram: Due Now Patient Declined Bone Density: Due Now patient will think on this  Recommended yearly ophthalmology/optometry visit for glaucoma screening and checkup Recommended yearly dental visit for hygiene and checkup  Vaccinations: Influenza vaccine: Due now  Pneumococcal vaccine: Due now Tdap vaccine: Due now  Shingles vaccine: Due now     Advanced directives: Information Provided  Conditions/risks identified: Hypertension, Diabetes  Next appointment: 1 year    Preventive Care 75 Years and Older, Female Preventive care refers to lifestyle choices and visits with your health care provider that can promote health and wellness. What does preventive care include? A yearly physical exam. This is also called an annual well check. Dental exams once or twice a year. Routine eye exams. Ask your health care provider how often you should have your eyes checked. Personal lifestyle choices, including: Daily care of your teeth and gums. Regular physical activity. Eating a healthy diet. Avoiding tobacco and drug use. Limiting alcohol use. Practicing safe sex. Taking low-dose aspirin every day. Taking vitamin and mineral supplements as recommended by your health care provider. What happens during an annual well check? The services and screenings done by your health care provider during your annual well check will depend on your age, overall health, lifestyle risk factors, and family history of disease. Counseling  Your health care provider may ask you questions about your: Alcohol use. Tobacco use. Drug use. Emotional well-being. Home and relationship well-being. Sexual activity. Eating  habits. History of falls. Memory and ability to understand (cognition). Work and work Statistician. Reproductive health. Screening  You may have the following tests or measurements: Height, weight, and BMI. Blood pressure. Lipid and cholesterol levels. These may be checked every 5 years, or more frequently if you are over 49 years old. Skin check. Lung cancer screening. You may have this screening every year starting at age 39 if you have a 30-pack-year history of smoking and currently smoke or have quit within the past 15 years. Fecal occult blood test (FOBT) of the stool. You may have this test every year starting at age 23. Flexible sigmoidoscopy or colonoscopy. You may have a sigmoidoscopy every 5 years or a colonoscopy every 10 years starting at age 12. Hepatitis C blood test. Hepatitis B blood test. Sexually transmitted disease (STD) testing. Diabetes screening. This is done by checking your blood sugar (glucose) after you have not eaten for a while (fasting). You may have this done every 1-3 years. Bone density scan. This is done to screen for osteoporosis. You may have this done starting at age 25. Mammogram. This may be done every 1-2 years. Talk to your health care provider about how often you should have regular mammograms. Talk with your health care provider about your test results, treatment options, and if necessary, the need for more tests. Vaccines  Your health care provider may recommend certain vaccines, such as: Influenza vaccine. This is recommended every year. Tetanus, diphtheria, and acellular pertussis (Tdap, Td) vaccine. You may need a Td booster every 10 years. Zoster vaccine. You may need this after age 67. Pneumococcal 13-valent conjugate (PCV13) vaccine. One dose is recommended after age 75. Pneumococcal polysaccharide (PPSV23) vaccine. One dose is recommended after age 75. Talk to your  health care provider about which screenings and vaccines you need and how  often you need them. This information is not intended to replace advice given to you by your health care provider. Make sure you discuss any questions you have with your health care provider. Document Released: 12/02/2015 Document Revised: 07/25/2016 Document Reviewed: 09/06/2015 Elsevier Interactive Patient Education  2017 [place] Prevention in the Home Falls can cause injuries. They can happen to people of all ages. There are many things you can do to make your home safe and to help prevent falls. What can I do on the outside of my home? Regularly fix the edges of walkways and driveways and fix any cracks. Remove anything that might make you trip as you walk through a door, such as a raised step or threshold. Trim any bushes or trees on the path to your home. Use bright outdoor lighting. Clear any walking paths of anything that might make someone trip, such as rocks or tools. Regularly check to see if handrails are loose or broken. Make sure that both sides of any steps have handrails. Any raised decks and porches should have guardrails on the edges. Have any leaves, snow, or ice cleared regularly. Use sand or salt on walking paths during winter. Clean up any spills in your garage right away. This includes oil or grease spills. What can I do in the bathroom? Use night lights. Install grab bars by the toilet and in the tub and shower. Do not use towel bars as grab bars. Use non-skid mats or decals in the tub or shower. If you need to sit down in the shower, use a plastic, non-slip stool. Keep the floor dry. Clean up any water that spills on the floor as soon as it happens. Remove soap buildup in the tub or shower regularly. Attach bath mats securely with double-sided non-slip rug tape. Do not have throw rugs and other things on the floor that can make you trip. What can I do in the bedroom? Use night lights. Make sure that you have a light by your bed that is easy to  reach. Do not use any sheets or blankets that are too big for your bed. They should not hang down onto the floor. Have a firm chair that has side arms. You can use this for support while you get dressed. Do not have throw rugs and other things on the floor that can make you trip. What can I do in the kitchen? Clean up any spills right away. Avoid walking on wet floors. Keep items that you use a lot in easy-to-reach places. If you need to reach something above you, use a strong step stool that has a grab bar. Keep electrical cords out of the way. Do not use floor polish or wax that makes floors slippery. If you must use wax, use non-skid floor wax. Do not have throw rugs and other things on the floor that can make you trip. What can I do with my stairs? Do not leave any items on the stairs. Make sure that there are handrails on both sides of the stairs and use them. Fix handrails that are broken or loose. Make sure that handrails are as long as the stairways. Check any carpeting to make sure that it is firmly attached to the stairs. Fix any carpet that is loose or worn. Avoid having throw rugs at the top or bottom of the stairs. If you do have throw rugs, attach them  to the floor with carpet tape. Make sure that you have a light switch at the top of the stairs and the bottom of the stairs. If you do not have them, ask someone to add them for you. What else can I do to help prevent falls? Wear shoes that: Do not have high heels. Have rubber bottoms. Are comfortable and fit you well. Are closed at the toe. Do not wear sandals. If you use a stepladder: Make sure that it is fully opened. Do not climb a closed stepladder. Make sure that both sides of the stepladder are locked into place. Ask someone to hold it for you, if possible. Clearly mark and make sure that you can see: Any grab bars or handrails. First and last steps. Where the edge of each step is. Use tools that help you move  around (mobility aids) if they are needed. These include: Canes. Walkers. Scooters. Crutches. Turn on the lights when you go into a dark area. Replace any light bulbs as soon as they burn out. Set up your furniture so you have a clear path. Avoid moving your furniture around. If any of your floors are uneven, fix them. If there are any pets around you, be aware of where they are. Review your medicines with your doctor. Some medicines can make you feel dizzy. This can increase your chance of falling. Ask your doctor what other things that you can do to help prevent falls. This information is not intended to replace advice given to you by your health care provider. Make sure you discuss any questions you have with your health care provider. Document Released: 09/01/2009 Document Revised: 04/12/2016 Document Reviewed: 12/10/2014 Elsevier Interactive Patient Education  2017 [employer].

## 2021-06-06 NOTE — Assessment & Plan Note (Addendum)
BP Readings from Last 3 Encounters:  06/06/21 (!) 180/73  05/16/21 127/65  05/15/21 (!) 232/92  -her BP is elevated today, but is much better than the 232/92 that she had at her previous appointment -taking amlodipine 10 mg daily -taking hydralazine 10 mg BID; we discussed this, and she should take the hydralazine TID -taking metoprolol-XL 100 mg daily

## 2021-06-06 NOTE — Progress Notes (Signed)
Subjective:   Deborah Gomez is a 75 y.o. female who presents for Medicare Annual (Subsequent) preventive examination.  Review of Systems           Objective:    There were no vitals filed for this visit. There is no height or weight on file to calculate BMI.  Advanced Directives 05/16/2021 05/15/2021  Does Patient Have a Medical Advance Directive? No No  Would patient like information on creating a medical advance directive? No - Patient declined No - Patient declined    Current Medications (verified) Outpatient Encounter Medications as of 06/06/2021  Medication Sig   amLODipine (NORVASC) 10 MG tablet Take 1 tablet (10 mg total) by mouth daily.   aspirin EC 81 MG tablet Take 81 mg by mouth daily. Swallow whole.   ergocalciferol (VITAMIN D2) 1.25 MG (50000 UT) capsule Take 50,000 Units by mouth once a week.   ferrous sulfate 325 (65 FE) MG tablet Take 325 mg by mouth 2 (two) times daily with a meal.   gabapentin (NEURONTIN) 100 MG capsule Take 100 mg by mouth 3 (three) times daily.   hydrALAZINE (APRESOLINE) 10 MG tablet Take 1 tablet (10 mg total) by mouth 3 (three) times daily. (Patient taking differently: Take 50 mg by mouth 2 (two) times daily.)   losartan (COZAAR) 100 MG tablet Take 100 mg by mouth daily.   meclizine (ANTIVERT) 25 MG tablet Take 25 mg by mouth 2 (two) times daily as needed for dizziness.   metFORMIN (GLUCOPHAGE) 500 MG tablet Take 500 mg by mouth 2 (two) times daily with a meal.   metoprolol succinate (TOPROL-XL) 100 MG 24 hr tablet Take 50 mg by mouth 2 (two) times daily. Take with or immediately following a meal.   pravastatin (PRAVACHOL) 40 MG tablet Take 40 mg by mouth daily.   traMADol (ULTRAM) 50 MG tablet Take 50 mg by mouth 2 (two) times daily as needed.   No facility-administered encounter medications on file as of 06/06/2021.    Allergies (verified) Patient has no known allergies.   History: Past Medical History:  Diagnosis Date   Chronic  kidney disease    Diabetes mellitus without complication ([place])    Hypertension    Past Surgical History:  Procedure Laterality Date   APPENDECTOMY     BREAST SURGERY Left    CARPAL TUNNEL RELEASE Right    REPLACEMENT TOTAL KNEE     THYROID SURGERY     No family history on file. 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  Tobacco Use   Smoking status: Every Day    Packs/day: 0.50    Years: 52.00    Pack years: 26.00    Types: Cigarettes   Smokeless tobacco: Never  Vaping Use   Vaping Use: Never used  Substance and Sexual Activity   Alcohol use: Not Currently   Drug use: Never   Sexual activity: Yes    Birth control/protection: Post-menopausal  Other Topics Concern   Not on file  Social History Narrative   1 child in [place], the other is not nearby   Social Determinants of Radio broadcast assistant Strain: Not on file  Food Insecurity: Not on file  Transportation Needs: Not on file  Physical Activity: Not on file  Stress: Not on file  Social Connections: Not on file    Tobacco Counseling Ready to quit: Not Answered Counseling given: Not Answered   Clinical Intake:                 Diabetic?Yes         Activities of Daily Living In your present state of health, do you have any difficulty performing the following activities: 05/16/2021  Hearing? N  Vision? N  Difficulty concentrating or making decisions? Y  Walking or climbing stairs? Y  Dressing or bathing? N  Doing errands, shopping? N  Some recent data might be hidden    Patient Care Team: Noreene Larsson, NP as PCP - General (Nurse Practitioner)  Indicate any recent Medical Services you may have received from other than [facility]  providers in the past year (date may be approximate).     Assessment:   This is a routine wellness examination for [place].  Hearing/Vision screen No results found.  Dietary issues and exercise activities discussed:     Goals Addressed   None   Depression Screen PHQ 2/9 Scores 06/06/2021 05/15/2021  PHQ - 2 Score 1 0    Fall Risk Fall Risk  06/06/2021 05/15/2021  Falls in the past year? 0 0  Number falls in past yr: 0 0  Injury with Fall? 0 0  Risk for fall due to : No Fall Risks No Fall Risks  Follow up Falls evaluation completed Falls evaluation completed    [place]:  Any stairs in or around the home? No  If so, are there any without handrails? No  Home free of loose throw rugs in walkways, pet beds, electrical cords, etc? Yes  Adequate lighting in your home to reduce risk of falls? Yes   ASSISTIVE DEVICES UTILIZED TO PREVENT FALLS:  Life alert? No  Use of a cane, walker or w/c? Yes  Grab bars in the bathroom? No  Shower chair or bench in shower? Yes  Elevated toilet seat or a handicapped toilet? Yes   TIMED UP AND GO:  Was the test performed? No .  Length of time to ambulate 10 feet: NA sec.     Cognitive Function:        Immunizations  There is no immunization history on file for this patient.  TDap: Due Now  Flu Vaccine status: Declined, Education has been provided regarding the importance of this vaccine but patient still declined. Advised may receive this vaccine at local pharmacy or Health Dept. Aware to provide a copy of the vaccination record if obtained from local pharmacy or Health Dept. Verbalized acceptance and understanding.  Pneumococcal vaccine status: Due, Education has been provided regarding the importance of this vaccine. Advised may receive this vaccine at local pharmacy or Health Dept. Aware to provide a copy of the vaccination record if obtained from local pharmacy or Health Dept. Verbalized  acceptance and understanding.  Covid-19 vaccine status: Declined, Education has been provided regarding the importance of this vaccine but patient still declined. Advised may receive this vaccine at local pharmacy or Health Dept.or vaccine clinic. Aware to provide a copy of the vaccination record if obtained from local pharmacy or Health Dept. Verbalized acceptance and understanding.  Qualifies for Shingles Vaccine? Yes   Zostavax completed No   Shingrix Completed?: No.    Education has been provided regarding the importance of this  vaccine. Patient has been advised to call insurance company to determine out of pocket expense if they have not yet received this vaccine. Advised may also receive vaccine at local pharmacy or Health Dept. Verbalized acceptance and understanding.  Screening Tests Health Maintenance  Topic Date Due   FOOT EXAM  Never done   OPHTHALMOLOGY EXAM  Never done   Hepatitis C Screening  Never done   COLONOSCOPY (Pts 45-81yr Insurance coverage will need to be confirmed)  Never done   DEXA SCAN  Never done   COVID-19 Vaccine (1) 06/22/2021 (Originally 12/09/1950)   Zoster Vaccines- Shingrix (1 of 2) 09/06/2021 (Originally 12/10/1995)   TETANUS/TDAP  06/06/2022 (Originally 12/09/1964)   PNA vac Low Risk Adult (1 of 2 - PCV13) 06/06/2022 (Originally 12/09/2010)   INFLUENZA VACCINE  06/19/2021   HEMOGLOBIN A1C  11/14/2021   HPV VACCINES  Aged Out    Health Maintenance  Health Maintenance Due  Topic Date Due   FOOT EXAM  Never done   OPHTHALMOLOGY EXAM  Never done   Hepatitis C Screening  Never done   COLONOSCOPY (Pts 45-413yrInsurance coverage will need to be confirmed)  Never done   DEXA SCAN  Never done    Colorectal cancer screening: No longer required.   Mammogram status: No longer required due to Patient does not want to do this.  Bone Density: Patient does not want at this time   Lung Cancer Screening: (Low Dose CT Chest recommended if Age 75-80ears, 30  pack-year currently smoking OR have quit w/in 15years.) does qualify.   Lung Cancer Screening Referral: Patient does not want at this time   Additional Screening:  Hepatitis C Screening: does qualify; will discuss at upcoming appointment   Vision Screening: Recommended annual ophthalmology exams for early detection of glaucoma and other disorders of the eye. Is the patient up to date with their annual eye exam?  No  Who is the provider or what is the name of the office in which the patient attends annual eye exams? [facility] Re[facility]na HoffIf pt is not established with a provider, would they like to be referred to a provider to establish care? Yes .   Dental Screening: Recommended annual dental exams for proper oral hygiene  Community Resource Referral / Chronic Care Management: CRR required this visit?  No   CCM required this visit?  No      Plan:     I have personally reviewed and noted the following in the patient's chart:   Medical and social history Use of alcohol, tobacco or illicit drugs  Current medications and supplements including opioid prescriptions.  Functional ability and status Nutritional status Physical activity Advanced directives List of other physicians Hospitalizations, surgeries, and ER visits in previous 12 months Vitals Screenings to include cognitive, depression, and falls Referrals and appointments  In addition, I have reviewed and discussed with patient certain preventive protocols, quality metrics, and best practice recommendations. A written personalized care plan for preventive services as well as general preventive health recommendations were provided to patient.     ShShelda AltesCMA   06/06/2021   Nurse Notes:

## 2021-06-06 NOTE — Patient Instructions (Addendum)
Please have fasting labs drawn 2-3 days prior to your appointment so we can discuss the results during your office visit.  Please make sure you are taking amlodipine 10 mg daily, and take your hydralazine 10 mg tablets 3 times per day. Call us if your BP runs < 170/110 on consecutive days. Check your BP in the AM and in the PM, and bring logs of your home BPs to your next appointment.

## 2021-06-06 NOTE — Progress Notes (Signed)
New Patient Office Visit  Subjective:  Patient ID: Deborah Gomez, female    DOB: 01-Nov-1946  Age: 75 y.o. MRN: [id number]  CC:  Chief Complaint  Patient presents with   New Patient (Initial Visit)    Here to establish care, no complaints today.    HPI [name] presents for new patient visit. Transferring care from Dr. Darrick Grinder Puller, [place]. Last physical was over a year ago. Last labs with PCP were drawn over a year ago.  She states her SBP has been in the 150s-160s at home.  No acute concerns, and no headache today.  She has upcoming appointment with nephrology on 06/14/21.  Past Medical History:  Diagnosis Date   Chronic kidney disease    Diabetes mellitus without complication ([place])    Hypertension     Past Surgical History:  Procedure Laterality Date   APPENDECTOMY     BREAST SURGERY Left    CARPAL TUNNEL RELEASE Right    REPLACEMENT TOTAL KNEE     THYROID SURGERY      History reviewed. No pertinent family history.  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  Tobacco Use   Smoking status: Every Day    Packs/day: 0.50    Years: 52.00    Pack years: 26.00    Types: Cigarettes   Smokeless tobacco: Never  Vaping Use   Vaping Use: Never used  Substance and Sexual Activity   Alcohol use: Not Currently   Drug use: Never   Sexual activity: Yes    Birth control/protection: Post-menopausal  Other Topics Concern   Not on file  Social History Narrative   1 child in [place], the other is not nearby   Social Determinants of Radio broadcast assistant Strain: Not on file  Food Insecurity: Not on file  Transportation Needs: Not on file  Physical Activity: Not on file  Stress: Not on file  Social  Connections: Not on file  Intimate Partner Violence: Not on file    ROS Review of Systems  Constitutional: Negative.   Respiratory: Negative.    Cardiovascular: Negative.   Musculoskeletal: Negative.   Neurological:  Positive for numbness.       Has bilateral diabetic neuropathy  Psychiatric/Behavioral: Negative.     Objective:   Today's Vitals: BP (!) 180/73 (BP Location: Left Arm, Patient Position: Sitting, Cuff Size: Large)   Pulse 73   Temp 97.7 F (36.5 C) (Temporal)   Ht _0  (1.626 m)   Wt 167 lb (75.8 kg)   SpO2 97%   BMI 28.67 kg/m   Physical Exam Constitutional:      Appearance: Normal appearance.  Cardiovascular:     Rate and Rhythm: Normal rate and regular rhythm.     Pulses: Normal pulses.     Heart sounds: Normal heart sounds.  Pulmonary:     Effort: Pulmonary effort is normal.     Breath sounds: Normal breath sounds.  Neurological:     General: No focal deficit present.     Mental Status: She is alert and oriented to person, place, and time. Mental status is at baseline.  Psychiatric:        Mood and Affect:  Mood normal.        Behavior: Behavior normal.        Thought Content: Thought content normal.        Judgment: Judgment normal.    Assessment & Plan:   Problem List Items Addressed This Visit       Cardiovascular and Mediastinum   HTN (hypertension), malignant    BP Readings from Last 3 Encounters:  06/06/21 (!) 180/73  05/16/21 127/65  05/15/21 (!) 232/92  -her BP is elevated today, but is much better than the 232/92 that she had at her previous appointment -taking amlodipine 10 mg daily -taking hydralazine 10 mg BID; we discussed this, and she should take the hydralazine TID -taking metoprolol-XL 100 mg daily       Relevant Medications   losartan (COZAAR) 100 MG tablet   metoprolol succinate (TOPROL-XL) 100 MG 24 hr tablet   pravastatin (PRAVACHOL) 40 MG tablet   aspirin EC 81 MG tablet   amLODipine (NORVASC) 10 MG tablet    Other Relevant Orders   CBC with Differential/Platelet   CMP14+EGFR   Lipid Panel With LDL/HDL Ratio     Endocrine   DMII (diabetes mellitus, type 2) (HCC)    -takes metformin 500 mg BID for glycemic control -has bilateral neuropathy -takes gabapentin TID and tramadol PRN for neuropathy -on pravastatin and losartan -goal A1c < 7       Relevant Medications   losartan (COZAAR) 100 MG tablet   pravastatin (PRAVACHOL) 40 MG tablet   metFORMIN (GLUCOPHAGE) 500 MG tablet   aspirin EC 81 MG tablet   Other Relevant Orders   CBC with Differential/Platelet   CMP14+EGFR   Lipid Panel With LDL/HDL Ratio   Thyromegaly    -had previous thyroid surgery -requesting records       Relevant Medications   metoprolol succinate (TOPROL-XL) 100 MG 24 hr tablet   Other Relevant Orders   TSH + free T4     Genitourinary   CKD stage G3b/A2, 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 mg/g (HCC) (Chronic)    -has upcoming appointment with nephrology       Relevant Orders   CMP14+EGFR     Other   Vitamin D deficiency    -has been taking weekly vit D supplement       Relevant Orders   VITAMIN D 25 Hydroxy (Vit-D Deficiency, Fractures)   Iron deficiency    -will check iron panel with labs       Relevant Orders   Iron, TIBC and Ferritin Panel   Other Visit Diagnoses     Hyperlipidemia, unspecified hyperlipidemia type    -  Primary   Relevant Medications   losartan (COZAAR) 100 MG tablet   metoprolol succinate (TOPROL-XL) 100 MG 24 hr tablet   pravastatin (PRAVACHOL) 40 MG tablet   aspirin EC 81 MG tablet   amLODipine (NORVASC) 10 MG tablet   Other Relevant Orders   Lipid Panel With LDL/HDL Ratio       Outpatient Encounter Medications as of 06/06/2021  Medication Sig   amLODipine (NORVASC) 10 MG tablet Take 1 tablet (10 mg total) by mouth daily.   aspirin EC 81 MG tablet Take 81 mg by mouth daily. Swallow whole.   ergocalciferol (VITAMIN D2) 1.25 MG (50000 UT) capsule  Take 50,000 Units by mouth once a week.   ferrous sulfate 325 (65 FE) MG tablet Take 325 mg by mouth 2 (two) times daily with a meal.   gabapentin (  NEURONTIN) 100 MG capsule Take 100 mg by mouth 3 (three) times daily.   hydrALAZINE (APRESOLINE) 10 MG tablet Take 1 tablet (10 mg total) by mouth 3 (three) times daily. (Patient taking differently: Take 50 mg by mouth 2 (two) times daily.)   losartan (COZAAR) 100 MG tablet Take 100 mg by mouth daily.   meclizine (ANTIVERT) 25 MG tablet Take 25 mg by mouth 2 (two) times daily as needed for dizziness.   metFORMIN (GLUCOPHAGE) 500 MG tablet Take 500 mg by mouth 2 (two) times daily with a meal.   metoprolol succinate (TOPROL-XL) 100 MG 24 hr tablet Take 50 mg by mouth 2 (two) times daily. Take with or immediately following a meal.   pravastatin (PRAVACHOL) 40 MG tablet Take 40 mg by mouth daily.   traMADol (ULTRAM) 50 MG tablet Take 50 mg by mouth 2 (two) times daily as needed.   [DISCONTINUED] amLODipine (NORVASC) 5 MG tablet Take 1 tablet (5 mg total) by mouth daily. (Patient taking differently: Take 10 mg by mouth daily.)   [DISCONTINUED] carvedilol (COREG) 3.125 MG tablet Take 1 tablet (3.125 mg total) by mouth 2 (two) times daily with a meal. (Patient not taking: Reported on 06/06/2021)   No facility-administered encounter medications on file as of 06/06/2021.    Follow-up: Return in about 1 month (around 07/07/2021) for Physical Exam.   Noreene Larsson, NP

## 2021-06-14 ENCOUNTER — Other Ambulatory Visit (HOSPITAL_COMMUNITY): Payer: Self-pay | Admitting: Nephrology

## 2021-06-14 DIAGNOSIS — R809 Proteinuria, unspecified: Secondary | ICD-10-CM

## 2021-06-14 DIAGNOSIS — E1122 Type 2 diabetes mellitus with diabetic chronic kidney disease: Secondary | ICD-10-CM | POA: Diagnosis not present

## 2021-06-14 DIAGNOSIS — Z7189 Other specified counseling: Secondary | ICD-10-CM | POA: Diagnosis not present

## 2021-06-14 DIAGNOSIS — I160 Hypertensive urgency: Secondary | ICD-10-CM | POA: Diagnosis not present

## 2021-06-14 DIAGNOSIS — E1129 Type 2 diabetes mellitus with other diabetic kidney complication: Secondary | ICD-10-CM | POA: Diagnosis not present

## 2021-06-14 DIAGNOSIS — N189 Chronic kidney disease, unspecified: Secondary | ICD-10-CM | POA: Diagnosis not present

## 2021-06-14 DIAGNOSIS — Z716 Tobacco abuse counseling: Secondary | ICD-10-CM | POA: Diagnosis not present

## 2021-06-14 DIAGNOSIS — E559 Vitamin D deficiency, unspecified: Secondary | ICD-10-CM | POA: Diagnosis not present

## 2021-06-16 DIAGNOSIS — Z7189 Other specified counseling: Secondary | ICD-10-CM | POA: Diagnosis not present

## 2021-06-16 DIAGNOSIS — E1122 Type 2 diabetes mellitus with diabetic chronic kidney disease: Secondary | ICD-10-CM | POA: Diagnosis not present

## 2021-06-16 DIAGNOSIS — R809 Proteinuria, unspecified: Secondary | ICD-10-CM | POA: Diagnosis not present

## 2021-06-16 DIAGNOSIS — N189 Chronic kidney disease, unspecified: Secondary | ICD-10-CM | POA: Diagnosis not present

## 2021-06-16 DIAGNOSIS — E1129 Type 2 diabetes mellitus with other diabetic kidney complication: Secondary | ICD-10-CM | POA: Diagnosis not present

## 2021-06-16 DIAGNOSIS — I160 Hypertensive urgency: Secondary | ICD-10-CM | POA: Diagnosis not present

## 2021-06-16 DIAGNOSIS — E559 Vitamin D deficiency, unspecified: Secondary | ICD-10-CM | POA: Diagnosis not present

## 2021-06-16 DIAGNOSIS — D511 Vitamin B12 deficiency anemia due to selective vitamin B12 malabsorption with proteinuria: Secondary | ICD-10-CM | POA: Diagnosis not present

## 2021-06-23 ENCOUNTER — Other Ambulatory Visit (HOSPITAL_COMMUNITY): Payer: Self-pay | Admitting: Nephrology

## 2021-06-23 DIAGNOSIS — I10 Essential (primary) hypertension: Secondary | ICD-10-CM

## 2021-06-27 ENCOUNTER — Ambulatory Visit (HOSPITAL_COMMUNITY)
Admission: RE | Admit: 2021-06-27 | Discharge: 2021-06-27 | Disposition: A | Discharge status: Home / Self Care | Payer: Medicare HMO | Source: Ambulatory Visit | Attending: Cardiology | Admitting: Cardiology

## 2021-06-27 ENCOUNTER — Other Ambulatory Visit: Payer: Self-pay

## 2021-06-27 DIAGNOSIS — I10 Essential (primary) hypertension: Secondary | ICD-10-CM | POA: Insufficient documentation

## 2021-07-12 ENCOUNTER — Other Ambulatory Visit (HOSPITAL_COMMUNITY): Payer: Self-pay | Admitting: Nephrology

## 2021-07-12 ENCOUNTER — Encounter: Payer: Self-pay | Admitting: Nurse Practitioner

## 2021-07-12 ENCOUNTER — Ambulatory Visit (INDEPENDENT_AMBULATORY_CARE_PROVIDER_SITE_OTHER): Payer: Medicare HMO | Admitting: Nurse Practitioner

## 2021-07-12 ENCOUNTER — Other Ambulatory Visit: Payer: Self-pay

## 2021-07-12 VITALS — BP 159/78 | HR 97 | Temp 98.8°F | Resp 18 | Ht 64.0 in | Wt 165.0 lb

## 2021-07-12 DIAGNOSIS — E1122 Type 2 diabetes mellitus with diabetic chronic kidney disease: Secondary | ICD-10-CM

## 2021-07-12 DIAGNOSIS — E010 Iodine-deficiency related diffuse (endemic) goiter: Secondary | ICD-10-CM | POA: Diagnosis not present

## 2021-07-12 DIAGNOSIS — E611 Iron deficiency: Secondary | ICD-10-CM | POA: Diagnosis not present

## 2021-07-12 DIAGNOSIS — Z1211 Encounter for screening for malignant neoplasm of colon: Secondary | ICD-10-CM | POA: Diagnosis not present

## 2021-07-12 DIAGNOSIS — I10 Essential (primary) hypertension: Secondary | ICD-10-CM

## 2021-07-12 DIAGNOSIS — Z13820 Encounter for screening for osteoporosis: Secondary | ICD-10-CM | POA: Diagnosis not present

## 2021-07-12 DIAGNOSIS — E785 Hyperlipidemia, unspecified: Secondary | ICD-10-CM | POA: Diagnosis not present

## 2021-07-12 DIAGNOSIS — N184 Chronic kidney disease, stage 4 (severe): Secondary | ICD-10-CM

## 2021-07-12 DIAGNOSIS — N1832 Chronic kidney disease, stage 3b: Secondary | ICD-10-CM | POA: Diagnosis not present

## 2021-07-12 DIAGNOSIS — Z0001 Encounter for general adult medical examination with abnormal findings: Secondary | ICD-10-CM | POA: Diagnosis not present

## 2021-07-12 DIAGNOSIS — E559 Vitamin D deficiency, unspecified: Secondary | ICD-10-CM | POA: Diagnosis not present

## 2021-07-12 NOTE — Patient Instructions (Signed)
We will meet up for med check in 1 month.  We will check labs in 3 months. Please have fasting labs drawn 2-3 days prior to your appointment so we can discuss the results during your office visit.

## 2021-07-12 NOTE — Progress Notes (Signed)
Established Patient Office Visit  Subjective:  Patient ID: Deborah Gomez, female    DOB: Mar 03, 1946  Age: 75 y.o. MRN: [id number]  CC:  Chief Complaint  Patient presents with   Annual Exam     HPI Deborah Gomez presents for physical exam. She didn't have labs drawn prior to her appointment, as encouraged.  She states she only has been taking medication that Dr. Theador Hawthorne prescribed, and she is unsure what medicines she is taking.  No acute concerns today except for elevated BP.  Past Medical History:  Diagnosis Date   Chronic kidney disease    Diabetes mellitus without complication ([place])    Hypertension     Past Surgical History:  Procedure Laterality Date   APPENDECTOMY     BREAST SURGERY Left    CARPAL TUNNEL RELEASE Right    REPLACEMENT TOTAL KNEE     THYROID SURGERY      History reviewed. No pertinent family history.  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  Tobacco Use   Smoking status: Every Day    Packs/day: 0.50    Years: 52.00    Pack years: 26.00    Types: Cigarettes   Smokeless tobacco: Never  Vaping Use   Vaping Use: Never used  Substance and Sexual Activity   Alcohol use: Not Currently   Drug use: Never   Sexual activity: Yes    Birth control/protection: Post-menopausal  Other Topics Concern   Not on file  Social History Narrative   1 child in [place], the other is not nearby   Social Determinants of Radio broadcast assistant Strain: Low Risk    Difficulty of Paying Living Expenses: Not hard at all  Food Insecurity: No Food Insecurity   Worried About Charity fundraiser in the Last Year: Never true   Arboriculturist in the Last Year: Never true  Transportation Needs: No  Transportation Needs   Lack of Transportation (Medical): No   Lack of Transportation (Non-Medical): No  Physical Activity: Insufficiently Active   Days of Exercise per Week: 3 days   Minutes of Exercise per Session: 40 min  Stress: No Stress Concern Present   Feeling of Stress : Not at all  Social Connections: Moderately Isolated   Frequency of Communication with Friends and Family: More than three times a week   Frequency of Social Gatherings with Friends and Family: More than three times a week   Attends Religious Services: 1 to 4 times per year   Active Member of [employer] or Organizations: No   Attends Archivist Meetings: Never   Marital Status: Widowed  Human resources officer Violence: Not At Risk   Fear of Current or Ex-Partner: No   Emotionally Abused: No   Physically Abused: No   Sexually Abused: No    Outpatient Medications Prior to Visit  Medication Sig Dispense Refill   amLODipine (NORVASC) 10 MG tablet Take 1 tablet (10 mg total) by mouth daily. 90 tablet 3   aspirin EC 81 MG tablet Take 81 mg by mouth daily. Swallow whole.     gabapentin (NEURONTIN) 100 MG capsule Take 100 mg by mouth 3 (three) times daily. (Patient  not taking: Reported on 07/12/2021)     hydrALAZINE (APRESOLINE) 10 MG tablet Take 1 tablet (10 mg total) by mouth 3 (three) times daily. (Patient taking differently: Take 50 mg by mouth 2 (two) times daily.) 90 tablet 1   losartan (COZAAR) 100 MG tablet Take 100 mg by mouth daily.     meclizine (ANTIVERT) 25 MG tablet Take 25 mg by mouth 2 (two) times daily as needed for dizziness.     metFORMIN (GLUCOPHAGE) 500 MG tablet Take 500 mg by mouth 2 (two) times daily with a meal.     metoprolol succinate (TOPROL-XL) 100 MG 24 hr tablet Take 50 mg by mouth 2 (two) times daily. Take with or immediately following a meal.     pravastatin (PRAVACHOL) 40 MG tablet Take 40 mg by mouth daily.     traMADol (ULTRAM) 50 MG tablet Take 50 mg by mouth 2 (two) times daily as  needed.     ergocalciferol (VITAMIN D2) 1.25 MG (50000 UT) capsule Take 50,000 Units by mouth once a week.     ferrous sulfate 325 (65 FE) MG tablet Take 325 mg by mouth 2 (two) times daily with a meal.     No facility-administered medications prior to visit.    No Known Allergies  ROS Review of Systems  Constitutional: Negative.   HENT: Negative.    Eyes: Negative.   Respiratory: Negative.    Cardiovascular: Negative.   Gastrointestinal: Negative.   Endocrine: Negative.   Genitourinary: Negative.   Musculoskeletal: Negative.   Skin: Negative.   Allergic/Immunologic: Negative.   Neurological: Negative.   Hematological: Negative.   Psychiatric/Behavioral: Negative.       Objective:    Physical Exam Constitutional:      Appearance: Normal appearance.  HENT:     Head: Normocephalic and atraumatic.     Right Ear: Tympanic membrane, ear canal and external ear normal.     Left Ear: Tympanic membrane, ear canal and external ear normal.     Nose: Nose normal.     Mouth/Throat:     Mouth: Mucous membranes are moist.     Pharynx: Oropharynx is clear.  Eyes:     Extraocular Movements: Extraocular movements intact.     Conjunctiva/sclera: Conjunctivae normal.     Pupils: Pupils are equal, round, and reactive to light.  Neck:     Comments: -surgical scars present, mass to left side of her neck (when questioned, she states that she has had this since her surgery and there are no concerns or changes) Cardiovascular:     Rate and Rhythm: Normal rate and regular rhythm.     Pulses: Normal pulses.     Heart sounds: Normal heart sounds.  Pulmonary:     Effort: Pulmonary effort is normal.     Breath sounds: Normal breath sounds.  Abdominal:     General: Abdomen is flat. Bowel sounds are normal. There is no distension.     Palpations: Abdomen is soft. There is no mass.     Tenderness: There is no abdominal tenderness. There is no guarding or rebound.     Hernia: No hernia is  present.  Musculoskeletal:        General: Normal range of motion.     Cervical back: Normal range of motion and neck supple.  Skin:    General: Skin is warm and dry.     Capillary Refill: Capillary refill takes less than 2 seconds.  Neurological:     General:  No focal deficit present.     Mental Status: She is alert and oriented to person, place, and time.     Cranial Nerves: No cranial nerve deficit.     Sensory: No sensory deficit.     Motor: No weakness.     Coordination: Coordination normal.     Gait: Gait normal.  Psychiatric:        Mood and Affect: Mood normal.        Behavior: Behavior normal.        Thought Content: Thought content normal.        Judgment: Judgment normal.    BP (!) 159/78 (BP Location: Right Arm)   Pulse 97   Temp 98.8 F (37.1 C)   Resp 18   Ht _0  (1.626 m)   Wt 165 lb (74.8 kg)   SpO2 97%   BMI 28.32 kg/m  Wt Readings from Last 3 Encounters:  07/12/21 165 lb (74.8 kg)  06/06/21 167 lb (75.8 kg)  05/15/21 169 lb (76.7 kg)     Health Maintenance Due  Topic Date Due   FOOT EXAM  Never done   OPHTHALMOLOGY EXAM  Never done   Hepatitis C Screening  Never done   COLONOSCOPY (Pts 45-64yr Insurance coverage will need to be confirmed)  Never done   DEXA SCAN  Never done   INFLUENZA VACCINE  06/19/2021    There are no preventive care reminders to display for this patient.  Lab Results  Component Value Date   TSH 1.895 05/16/2021   Lab Results  Component Value Date   WBC 6.0 05/15/2021   HGB 13.0 05/15/2021   HCT 44.0 05/15/2021   MCV 92.6 05/15/2021   PLT 215 05/15/2021   Lab Results  Component Value Date   NA 143 05/15/2021   K 4.3 05/15/2021   CO2 29 05/15/2021   GLUCOSE 112 (H) 05/15/2021   BUN 20 05/15/2021   CREATININE 1.65 (H) 05/15/2021   BILITOT 0.1 (L) 05/15/2021   ALKPHOS 85 05/15/2021   AST 20 05/15/2021   ALT 17 05/15/2021   PROT 6.7 05/15/2021   ALBUMIN 3.5 05/15/2021   CALCIUM 8.9 05/15/2021   ANIONGAP  6 05/15/2021   No results found for: CHOL No results found for: HDL No results found for: LDLCALC No results found for: TRIG No results found for: CHOLHDL Lab Results  Component Value Date   HGBA1C 6.4 (H) 05/15/2021      Assessment & Plan:   Problem List Items Addressed This Visit       Cardiovascular and Mediastinum   HTN (hypertension), malignant    BP Readings from Last 3 Encounters:  07/12/21 (!) 159/78  06/06/21 (!) 180/73  05/16/21 127/65  -she states she is only taking amlodipine from Dr. BTheador Hawthorne-she also has nifedipine and hydralazine -she is unsure what she is taking      Relevant Orders   CBC with Differential/Platelet   CMP14+EGFR   Lipid Panel With LDL/HDL Ratio   AMB Referral to [facility]    Endocrine   DMII (diabetes mellitus, type 2) ([place]    -checking A1c with labs -has bilateral neuropathy -takes metformin -reported taking losartan previously, but today she is unsure if she takes this -no statin currently- will check lipids -goal LDL < 70      Relevant Orders   Lipid Panel With LDL/HDL Ratio   Hemoglobin A1c   AMB Referral to [facility]  Genitourinary   CKD stage G3b/A2, 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   Follow-up: Return in about 1 month (around 08/12/2021) for Med check (HTN, DM).    Noreene Larsson, NP

## 2021-07-12 NOTE — Assessment & Plan Note (Addendum)
BP Readings from Last 3 Encounters:  07/12/21 (!) 159/78  06/06/21 (!) 180/73  05/16/21 127/65   -she states she is only taking amlodipine from Dr. Theador Hawthorne -she also has nifedipine and hydralazine -she is unsure what she is taking

## 2021-07-12 NOTE — Assessment & Plan Note (Signed)
-  checking A1c with labs -has bilateral neuropathy -takes metformin -reported taking losartan previously, but today she is unsure if she takes this -no statin currently- will check lipids -goal LDL < 70

## 2021-07-12 NOTE — Assessment & Plan Note (Signed)
-  followed by nephrology -will check renal fcn with labs today

## 2021-07-13 ENCOUNTER — Other Ambulatory Visit: Payer: Self-pay | Admitting: Nurse Practitioner

## 2021-07-13 ENCOUNTER — Telehealth: Payer: Self-pay | Admitting: *Deleted

## 2021-07-13 MED ORDER — PRAVASTATIN SODIUM 80 MG PO TABS: 80.0000 mg | ORAL_TABLET | Freq: Every day | ORAL | 3 refills | Status: DC

## 2021-07-13 MED ORDER — VITAMIN D (ERGOCALCIFEROL) 1.25 MG (50000 UNIT) PO CAPS: 50000.0000 [IU] | ORAL_CAPSULE | ORAL | 0 refills | Status: DC

## 2021-07-13 NOTE — Progress Notes (Signed)
LDL is elevated, and vit D is low. Kidney function is stable.  I sent in a higher dose of pravastatin and vitamin D to her mail order pharmacy.

## 2021-07-13 NOTE — Chronic Care Management (AMB) (Signed)
  Chronic Care Management   Note  07/13/2021 Name: Deborah Gomez MRN: [id number] DOB: 1946-06-10  Deborah Gomez is a 75 y.o. year old female who is a primary care patient of Noreene Larsson, NP. I reached out to [facility] by phone today in response to a referral sent by Deborah Gomez's PCP, Noreene Larsson, NP.      Deborah Gomez was given information about Chronic Care Management services today including:  CCM service includes personalized support from designated clinical staff supervised by her physician, including individualized plan of care and coordination with other care providers 24/7 contact phone numbers for assistance for urgent and routine care needs. Service will only be billed when office clinical staff spend 20 minutes or more in a month to coordinate care. Only one practitioner may furnish and bill the service in a calendar month. The patient may stop CCM services at any time (effective at the end of the month) by phone call to the office staff. The patient will be responsible for cost sharing (co-pay) of up to 20% of the service fee (after annual deductible is met).  Patient agreed to services and verbal consent obtained.   Follow up plan: Telephone appointment with care management team member scheduled for:07/20/21  [facility]  Direct Dial: [phone]

## 2021-07-14 LAB — CMP14+EGFR
ALT: 15 IU/L (ref 0–32)
AST: 19 IU/L (ref 0–40)
Albumin/Globulin Ratio: 1.6 (ref 1.2–2.2)
Albumin: 4 g/dL (ref 3.7–4.7)
Alkaline Phosphatase: 108 IU/L (ref 44–121)
BUN/Creatinine Ratio: 12 (ref 12–28)
BUN: 20 mg/dL (ref 8–27)
Bilirubin Total: <0.2 mg/dL (ref 0.0–1.2)
CO2: 24 mmol/L (ref 20–29)
Calcium: 9.5 mg/dL (ref 8.7–10.3)
Chloride: 105 mmol/L (ref 96–106)
Creatinine, Ser: 1.67 mg/dL — ABNORMAL HIGH (ref 0.57–1.00)
Globulin, Total: 2.5 g/dL (ref 1.5–4.5)
Glucose: 95 mg/dL (ref 65–99)
Potassium: 5.1 mmol/L (ref 3.5–5.2)
Sodium: 142 mmol/L (ref 134–144)
Total Protein: 6.5 g/dL (ref 6.0–8.5)
eGFR: 32 mL/min/{1.73_m2} — ABNORMAL LOW (ref >59)

## 2021-07-14 LAB — CBC WITH DIFFERENTIAL/PLATELET
Basophils Absolute: 0.1 10*3/uL (ref 0.0–0.2)
Basos: 1 %
EOS (ABSOLUTE): 0.1 10*3/uL (ref 0.0–0.4)
Eos: 1 %
Hematocrit: 36.7 % (ref 34.0–46.6)
Hemoglobin: 11.7 g/dL (ref 11.1–15.9)
Immature Grans (Abs): 0 10*3/uL (ref 0.0–0.1)
Immature Granulocytes: 0 %
Lymphocytes Absolute: 2.5 10*3/uL (ref 0.7–3.1)
Lymphs: 34 %
MCH: 27.3 pg (ref 26.6–33.0)
MCHC: 31.9 g/dL (ref 31.5–35.7)
MCV: 86 fL (ref 79–97)
Monocytes Absolute: 0.7 10*3/uL (ref 0.1–0.9)
Monocytes: 9 %
Neutrophils Absolute: 4 10*3/uL (ref 1.4–7.0)
Neutrophils: 55 %
Platelets: 287 10*3/uL (ref 150–450)
RBC: 4.28 x10E6/uL (ref 3.77–5.28)
RDW: 13 % (ref 11.7–15.4)
WBC: 7.4 10*3/uL (ref 3.4–10.8)

## 2021-07-14 LAB — IRON,TIBC AND FERRITIN PANEL
Ferritin: 337 ng/mL — ABNORMAL HIGH (ref 15–150)
Iron Saturation: 36 % (ref 15–55)
Iron: 86 ug/dL (ref 27–139)
Total Iron Binding Capacity: 236 ug/dL — ABNORMAL LOW (ref 250–450)
UIBC: 150 ug/dL (ref 118–369)

## 2021-07-14 LAB — LIPID PANEL WITH LDL/HDL RATIO
Cholesterol, Total: 254 mg/dL — ABNORMAL HIGH (ref 100–199)
HDL: 79 mg/dL (ref >39)
LDL Chol Calc (NIH): 154 mg/dL — ABNORMAL HIGH (ref 0–99)
LDL/HDL Ratio: 1.9 ratio (ref 0.0–3.2)
Triglycerides: 122 mg/dL (ref 0–149)
VLDL Cholesterol Cal: 21 mg/dL (ref 5–40)

## 2021-07-14 LAB — VITAMIN D 25 HYDROXY (VIT D DEFICIENCY, FRACTURES): Vit D, 25-Hydroxy: 14.7 ng/mL — ABNORMAL LOW (ref 30.0–100.0)

## 2021-07-14 LAB — TSH+FREE T4
Free T4: 1.33 ng/dL (ref 0.82–1.77)
TSH: 0.984 u[IU]/mL (ref 0.450–4.500)

## 2021-07-19 ENCOUNTER — Ambulatory Visit (HOSPITAL_COMMUNITY)
Admission: RE | Admit: 2021-07-19 | Discharge: 2021-07-19 | Disposition: A | Discharge status: Home / Self Care | Payer: Medicare HMO | Source: Ambulatory Visit | Attending: Nephrology | Admitting: Nephrology

## 2021-07-19 ENCOUNTER — Other Ambulatory Visit: Payer: Self-pay

## 2021-07-19 DIAGNOSIS — E1122 Type 2 diabetes mellitus with diabetic chronic kidney disease: Secondary | ICD-10-CM | POA: Insufficient documentation

## 2021-07-19 DIAGNOSIS — R809 Proteinuria, unspecified: Secondary | ICD-10-CM | POA: Diagnosis not present

## 2021-07-19 DIAGNOSIS — N189 Chronic kidney disease, unspecified: Secondary | ICD-10-CM | POA: Diagnosis not present

## 2021-07-20 ENCOUNTER — Ambulatory Visit (INDEPENDENT_AMBULATORY_CARE_PROVIDER_SITE_OTHER): Payer: Medicare HMO | Admitting: Pharmacist

## 2021-07-20 DIAGNOSIS — N1832 Chronic kidney disease, stage 3b: Secondary | ICD-10-CM

## 2021-07-20 DIAGNOSIS — E1122 Type 2 diabetes mellitus with diabetic chronic kidney disease: Secondary | ICD-10-CM

## 2021-07-20 DIAGNOSIS — F17200 Nicotine dependence, unspecified, uncomplicated: Secondary | ICD-10-CM

## 2021-07-20 DIAGNOSIS — I10 Essential (primary) hypertension: Secondary | ICD-10-CM

## 2021-07-20 NOTE — Addendum Note (Signed)
Addended by: Beryle Lathe on: 07/20/2021 03:59 PM   Modules accepted: Orders

## 2021-07-20 NOTE — Chronic Care Management (AMB) (Signed)
Chronic Care Management Pharmacy Note  07/20/2021 Name:  Deborah Gomez MRN:  [id number] DOB:  December 04, 1945  Summary:  Difficult to determine what medications patient is actually taking. Patient did not bring medications in during face-to-face visit today but called me when she returned home and medication reconciliation was completed. Mail order pharmacy and local pharmacy were called to confirm what medications have been recently dispensed. Medication adherence was stressed to patient. It was discovered that the patient's mail order pharmacy did not have her updated phone number and address so that is why she has not been receiving her medications. Patient was made aware and was instructed to call [phone] to update this and request a re-shipment of her medicines to her new address.  Will discuss the importance of smoking cessation at next visit  Recommendations made from today's visit:  Discussed diabetes with PCP and will hold off on restarting metformin given borderline renal function (GFR 32 mL/min). Metformin use is contraindicated if GFR decreases below 30 mL/min. Discussed hypertension with PCP and requested refill of amlodipine 10 mg by mouth daily from mail order pharmacy. Will consider adding carvedilol if further blood pressure lowering needed at future visits.  Consider SGLT-2 inhibitor for diabetes and chronic kidney disease. Will discuss at future visits. Strongly consider increasing to high intensity statin such as rosuavstatin 20 or 40 mg by mouth daily +/- ezetimibe 10 mg by mouth daily to lower LDL and ASCVD risk  Subjective: Deborah Gomez is an 75 y.o. year old female who is a primary patient of Noreene Larsson, NP. The CCM team was consulted for assistance with disease management and care coordination needs.    Engaged with patient face to face for initial visit in response to provider referral for pharmacy case management and/or care coordination services.    Consent to Services:  The patient was given the following information about Chronic Care Management services today, agreed to services, and gave verbal consent: 1. CCM service includes personalized support from designated clinical staff supervised by the primary care provider, including individualized plan of care and coordination with other care providers 2. 24/7 contact phone numbers for assistance for urgent and routine care needs. 3. Service will only be billed when office clinical staff spend 20 minutes or more in a month to coordinate care. 4. Only one practitioner may furnish and bill the service in a calendar month. 5.The patient may stop CCM services at any time (effective at the end of the month) by phone call to the office staff. 6. The patient will be responsible for cost sharing (co-pay) of up to 20% of the service fee (after annual deductible is met). Patient agreed to services and consent obtained.  Patient Care Team: Noreene Larsson, NP as PCP - General (Nurse Practitioner) Beryle Lathe, [facility] (Pharmacist)  Objective:  Lab Results  Component Value Date   CREATININE 1.67 (H) 07/12/2021   CREATININE 1.65 (H) 05/15/2021    Lab Results  Component Value Date   HGBA1C 6.4 (H) 05/15/2021   Last diabetic Eye exam: No results found for: HMDIABEYEEXA  Last diabetic Foot exam: No results found for: HMDIABFOOTEX      Component Value Date/Time   CHOL 254 (H) 07/12/2021 1145   TRIG 122 07/12/2021 1145   HDL 79 07/12/2021 1145   LDLCALC 154 (H) 07/12/2021 1145    Hepatic Function Latest Ref Rng & Units 07/12/2021 05/15/2021  Total Protein 6.0 - 8.5 g/dL 6.5 6.7  Albumin 3.7 -  4.7 g/dL 4.0 3.5  AST 0 - 40 IU/L 19 20  ALT 0 - 32 IU/L 15 17  Alk Phosphatase 44 - 121 IU/L 108 85  Total Bilirubin 0.0 - 1.2 mg/dL <0.2 0.1(L)    Lab Results  Component Value Date/Time   TSH 0.984 07/12/2021 11:45 AM   TSH 1.895 05/16/2021 02:16 AM   FREET4 1.33 07/12/2021 11:45 AM     CBC Latest Ref Rng & Units 07/12/2021 05/15/2021  WBC 3.4 - 10.8 x10E3/uL 7.4 6.0  Hemoglobin 11.1 - 15.9 g/dL 11.7 13.0  Hematocrit 34.0 - 46.6 % 36.7 44.0  Platelets 150 - 450 x10E3/uL 287 215    Lab Results  Component Value Date/Time   VD25OH 14.7 (L) 07/12/2021 11:45 AM    Clinical ASCVD: No  The 10-year ASCVD risk score Mikey Bussing DC Jr., et al., 2013) is: 79.8%   Values used to calculate the score:     Age: 75 years     Sex: Female     Is Non-Hispanic African American: Yes     Diabetic: Yes     Tobacco smoker: Yes     Systolic Blood Pressure: 505 mmHg     Is BP treated: Yes     HDL Cholesterol: 79 mg/dL     Total Cholesterol: 254 mg/dL    Social History   Tobacco Use  Smoking Status Every Day   Packs/day: 0.50   Years: 52.00   Pack years: 26.00   Types: Cigarettes  Smokeless Tobacco Never   BP Readings from Last 3 Encounters:  07/12/21 (!) 159/78  06/06/21 (!) 180/73  05/16/21 127/65   Pulse Readings from Last 3 Encounters:  07/12/21 97  06/06/21 73  05/16/21 68   Wt Readings from Last 3 Encounters:  07/12/21 165 lb (74.8 kg)  06/06/21 167 lb (75.8 kg)  05/15/21 169 lb (76.7 kg)    Assessment: Review of patient past medical history, allergies, medications, health status, including review of consultants reports, laboratory and other test data, was performed as part of comprehensive evaluation and provision of chronic care management services.   SDOH:  (Social Determinants of Health) assessments and interventions performed:    CCM Care Plan  No Known Allergies  Medications Reviewed Today     Reviewed by Beryle Lathe, [facility] (Pharmacist) on 07/20/21 at 1455  Med List Status: <None>   Medication Order Taking? Sig Documenting Provider Last Dose Status Informant  amLODipine (NORVASC) 10 MG tablet [id number] No Take 1 tablet (10 mg total) by mouth daily.  Patient not taking: Reported on 07/20/2021   Noreene Larsson, NP Not Taking Active Self   aspirin EC 81 MG tablet [id number] Yes Take 81 mg by mouth daily. Swallow whole. [provider] Taking Active   carvedilol (COREG) 3.125 MG tablet [id number] No Take 3.125 mg by mouth 2 (two) times daily with a meal. [provider] Unknown Active   ferrous sulfate 325 (65 FE) MG tablet [id number] No Take 1 tablet by mouth 2 (two) times daily.  Patient not taking: Reported on 07/20/2021   [provider] Not Taking Active   gabapentin (NEURONTIN) 100 MG capsule [id number] No Take 100 mg by mouth 3 (three) times daily.  Patient not taking: No sig reported   [provider] Not Taking Active   hydrALAZINE (APRESOLINE) 25 MG tablet [id number] Yes Take 25 mg by mouth 3 (three) times daily. [provider] Taking Active Self  losartan (COZAAR) 100 MG tablet [id number] No  Take 100 mg by mouth daily.  Patient not taking: Reported on 07/20/2021   [provider] Not Taking Active   meclizine (ANTIVERT) 25 MG tablet [id number] No Take 25 mg by mouth 2 (two) times daily as needed for dizziness.  Patient not taking: Reported on 07/20/2021   [provider] Not Taking Active   metFORMIN (GLUCOPHAGE) 500 MG tablet [id number] No Take 500 mg by mouth 2 (two) times daily with a meal.  Patient not taking: Reported on 07/20/2021   [provider] Not Taking Active   metoprolol succinate (TOPROL-XL) 100 MG 24 hr tablet [id number] No Take 50 mg by mouth 2 (two) times daily. Take with or immediately following a meal.  Patient not taking: Reported on 07/20/2021   [provider] Not Taking Active   NIFEdipine (PROCARDIA XL/NIFEDICAL XL) 60 MG 24 hr tablet [id number] Yes Take 60 mg by mouth daily. [provider] Taking Active Self  pravastatin (PRAVACHOL) 80 MG tablet [id number] No Take 1 tablet (80 mg total) by mouth daily.  Patient not taking: Reported on 07/20/2021   Noreene Larsson, NP Not Taking Active            Med Note Jim Like Jul 20, 2021  1:49 PM) Has not received from mail order yet  traMADol (ULTRAM) 50 MG tablet [id number] No Take 50 mg by mouth 2 (two) times daily as needed.  Patient not taking: Reported on 07/20/2021   [provider] Not Taking Active   Vitamin D, Ergocalciferol, (DRISDOL) 1.25 MG (50000 UNIT) CAPS capsule [id number] No Take 1 capsule (50,000 Units total) by mouth every 7 (seven) days.  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  Conditions to be addressed/monitored: HTN, HLD, DMII, and CKD Stage 3b  Care Plan : Medication Management  Updates made by Beryle Lathe, [place] since 07/20/2021 12:00 AM     Problem: HTN, T2DM, HLD, CKD, Tobacco Abuse   Priority: High  Onset Date: 07/20/2021     Long-Range Goal: Disease Progression Prevention   Start Date: 07/20/2021  Expected End Date: 10/18/2021  This Visit's Progress: On track  Priority: High  Note:   Current Barriers:  Unable to achieve control of hypertension  and hyperlipidemia  Pharmacist Clinical Goal(s):  Over the next 90 days, patient will Achieve control of hypertension  and hyperlipidemia as evidenced by improved blood pressure control and improved LDL through collaboration with PharmD and provider.   Interventions: 1:1 collaboration with Noreene Larsson, NP regarding development and update of comprehensive plan of care as  evidenced by provider attestation and co-signature Inter-disciplinary care team collaboration (see longitudinal plan of care) Comprehensive medication review performed; medication list updated in electronic medical record  Type 2 Diabetes:  Current medications: none (patient has a history of taking metformin 500 mg by mouth twice daily with meals) Intolerances: none Taking medications as directed: no, is not taking metformin since she does not have any refills Side effects thought to be attributed to current medication regimen: no Denies hypoglycemic/hyperglycemic symptoms Hypoglycemia prevention: not indicated at this time Current meal patterns: not discussed today Current exercise:  not discussed today On a statin: '[x]'  Yes  '[]'  No    Last microalbumin: unknown; on an ACEi/ARB: '[]'  Yes  '[x]'  No    Last eye exam: unknown Last foot exam: unknown Pneumonia vaccine: unknown Current glucose readings: will discuss at future visits Controlled; Most recent A1c at goal of <7% per ADA guidelines Discussed with PCP and will hold off on restarting metformin given borderline renal function (GFR 32 mL/min). Metformin use is contraindicated if GFR decreases below 30 mL/min. Patient identified as a good candidate for SGLT-2 inhibitor given reduction in cardiovascular disease, slowed chronic kidney disease progression, low risk of hypoglycemia, and blood pressure lowering. Patient denies a history of significant genitourinary infections. No concern for hypotension/volume depletion. GFR at least >20 mL/minute/1.73 m2. Will discuss at future visits.  Hypertension: Current medications: amlodipine 10 mg by mouth once daily, carvedilol 3.125 mg by mouth twice daily, hydralazine 25 mg by mouth three times daily, and nifedipine ER 60 mg by mouth daily Intolerances: unknown; has a history of taking amlodipine-valsartan 10-320 mg by mouth daily, losartan 100 mg by mouth daily, spironolactone 50 mg by mouth daily, and  metoprolol  Taking medications as directed: no, of the above prescribed medications, is only taking hydralazine and nifedipine Side effects thought to be attributed to current medication regimen: no Home blood pressure readings: 140-150s/70s per verbal report from patient  Blood pressure under poor control. Blood pressure is above goal of <130/80 mmHg per 2017 AHA/ACC guidelines. Working up for secondary causes of hypertension (Renal US 07/19/21) Encourage dietary sodium restriction/DASH diet Recommend regular aerobic exercise Recommend home blood pressure monitoring to discuss at next visit Reviewed risks of hypertension, principles of treatment and consequences of untreated hypertension Continue hydralazine 25 mg by mouth three time daily and nifedipine ER 60 mg by mouth daily  Discussed with PCP and will refill amlodipine 10 mg by mouth daily If further blood pressure lowering necessary, can add back carvedilol 3.125 mg by mouth twice daily which she was prescribed when she was discharged from the hospital  Hyperlipidemia: Current medications: pravastatin 80 mg by mouth once daily (recently increased from 40 mg by mouth daily due to elevated LDL) Intolerances: none Taking medications as directed: no, has been out of pravastatin for a while and has not yet received the pravastatin 80 mg from her mail order pharmacy Side effects thought to be attributed to current medication regimen: no Uncontrolled; LDL above goal of <70 due to very high risk given diabetes + at least 1 additional major risk factor (elevated LDL cholesterol, hypertension, chronic kidney disease (CKD), and cigarette smoking) per 2020 AACE/ACE guidelines and TG at goal of <150 per 2020 AACE/ACE guidelines Continue pravastatin 80 mg by mouth once daily for now; however, given extremely elevated ASCVD risk, would strongly consider increasing to high intensity statin such as rosuavstatin 20 or 40 mg by mouth daily +/- ezetimibe 10 mg  by mouth daily Recommend regular aerobic exercise Reviewed risks of hyperlipidemia, principles of treatment and consequences of untreated hyperlipidemia Discussed need for medication compliance At future visits, will further discuss the importance of smoking  cessation Re-check lipid panel in 4-12 weeks  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 Will defer to nephrologist.  Discussed need for medication compliance Discussed need for improved control of blood pressure Discussed need for improved control of cholesterol  Patient Goals/Self-Care Activities Over the next 90 days, patient will:  Focus on medication adherence by keeping up with prescription refills and either using a pill box or reminders to take your medications at the prescribed times Check blood pressure at least once daily, document, and provide at future appointments  Follow Up Plan: Face to Face appointment with care management team member scheduled for: 08/01/21      Medication Assistance: None required.  Patient affirms current coverage meets needs.  Patient's preferred pharmacy is:  [facility] - [place], [address] [place] [address] Phone: [phone] Fax: [phone]  Follow Up:  Patient agrees to Care Plan and Follow-up.  Plan: Face to Face appointment with care management team member scheduled for: 08/01/21  Kennon Holter, PharmD Clinical Pharmacist [facility] [phone]

## 2021-07-20 NOTE — Patient Instructions (Signed)
Deborah Gomez,  It was great to talk to you today! Please call your mail order pharmacy and update your address and phone number. Ask them to re-ship you your amlodipine and to also send your vitamin D and pravastatin to your new address.   Please call me with any questions or concerns.   Visit Information   PATIENT GOALS:   Goals Addressed             This Visit's Progress    Medication Management       Patient Goals/Self-Care Activities Over the next 90 days, patient will:  Focus on medication adherence by keeping up with prescription refills and either using a pill box or reminders to take your medications at the prescribed times Check blood pressure at least once daily, document, and provide at future appointments        Consent to CCM Services: Deborah Gomez was given information about Chronic Care Management services including:  CCM service includes personalized support from designated clinical staff supervised by her physician, including individualized plan of care and coordination with other care providers 24/7 contact phone numbers for assistance for urgent and routine care needs. Service will only be billed when office clinical staff spend 20 minutes or more in a month to coordinate care. Only one practitioner may furnish and bill the service in a calendar month. The patient may stop CCM services at any time (effective at the end of the month) by phone call to the office staff. The patient will be responsible for cost sharing (co-pay) of up to 20% of the service fee (after annual deductible is met).  Patient agreed to services and verbal consent obtained.   The patient verbalized understanding of instructions, educational materials, and care plan provided today and agreed to receive a mailed copy of patient instructions, educational materials, and care plan.   Face to Face appointment with care management team member scheduled for: 08/01/21  Kennon Holter,  PharmD Clinical Pharmacist [facility] [phone]  CLINICAL CARE PLAN: Patient Care Plan: Medication Management     Problem Identified: HTN, T2DM, HLD, CKD, Tobacco Abuse   Priority: High  Onset Date: 07/20/2021     Long-Range Goal: Disease Progression Prevention   Start Date: 07/20/2021  Expected End Date: 10/18/2021  This Visit's Progress: On track  Priority: High  Note:   Current Barriers:  Unable to achieve control of hypertension  and hyperlipidemia  Pharmacist Clinical Goal(s):  Over the next 90 days, patient will Achieve control of hypertension  and hyperlipidemia as evidenced by improved blood pressure control and improved LDL through collaboration with PharmD and provider.   Interventions: 1:1 collaboration with Noreene Larsson, NP regarding development and update of comprehensive plan of care as evidenced by provider attestation and co-signature Inter-disciplinary care team collaboration (see longitudinal plan of care) Comprehensive medication review performed; medication list updated in electronic medical record  Type 2 Diabetes: Current medications: none (patient has a history of taking metformin 500 mg by mouth twice daily with meals) Intolerances: none Taking medications as directed: no, is not taking metformin since she does not have any refills Side effects thought to be attributed to current medication regimen: no Denies hypoglycemic/hyperglycemic symptoms Hypoglycemia prevention: not indicated at this time Current meal patterns: not discussed today Current exercise:  not discussed today On a statin: '[x]'  Yes  '[]'  No    Last microalbumin: unknown; on an ACEi/ARB: '[]'  Yes  '[x]'  No    Last eye exam: unknown Last  foot exam: unknown Pneumonia vaccine: unknown Current glucose readings: will discuss at future visits Controlled; Most recent A1c at goal of <7% per ADA guidelines Discussed with PCP and will hold off on restarting metformin given borderline  renal function (GFR 32 mL/min). Metformin use is contraindicated if GFR decreases below 30 mL/min. Patient identified as a good candidate for SGLT-2 inhibitor given reduction in cardiovascular disease, slowed chronic kidney disease progression, low risk of hypoglycemia, and blood pressure lowering. Patient denies a history of significant genitourinary infections. No concern for hypotension/volume depletion. GFR at least >20 mL/minute/1.73 m2. Will discuss at future visits.  Hypertension: Current medications: amlodipine 10 mg by mouth once daily, carvedilol 3.125 mg by mouth twice daily, hydralazine 25 mg by mouth three times daily, and nifedipine ER 60 mg by mouth daily Intolerances: unknown; has a history of taking amlodipine-valsartan 10-320 mg by mouth daily, losartan 100 mg by mouth daily, spironolactone 50 mg by mouth daily, and metoprolol  Taking medications as directed: no, of the above prescribed medications, is only taking hydralazine and nifedipine Side effects thought to be attributed to current medication regimen: no Home blood pressure readings: 140-150s/70s per verbal report from patient  Blood pressure under poor control. Blood pressure is above goal of <130/80 mmHg per 2017 AHA/ACC guidelines. Working up for secondary causes of hypertension (Renal US 07/19/21) Encourage dietary sodium restriction/DASH diet Recommend regular aerobic exercise Recommend home blood pressure monitoring to discuss at next visit Reviewed risks of hypertension, principles of treatment and consequences of untreated hypertension Continue hydralazine 25 mg by mouth three time daily and nifedipine ER 60 mg by mouth daily  Discussed with PCP and will refill amlodipine 10 mg by mouth daily If further blood pressure lowering necessary, can add back carvedilol 3.125 mg by mouth twice daily which she was prescribed when she was discharged from the hospital  Hyperlipidemia: Current medications: pravastatin 80 mg by  mouth once daily (recently increased from 40 mg by mouth daily due to elevated LDL) Intolerances: none Taking medications as directed: no, has been out of pravastatin for a while and has not yet received the pravastatin 80 mg from her mail order pharmacy Side effects thought to be attributed to current medication regimen: no Uncontrolled; LDL above goal of <70 due to very high risk given diabetes + at least 1 additional major risk factor (elevated LDL cholesterol, hypertension, chronic kidney disease (CKD), and cigarette smoking) per 2020 AACE/ACE guidelines and TG at goal of <150 per 2020 AACE/ACE guidelines Continue pravastatin 80 mg by mouth once daily for now; however, given extremely elevated ASCVD risk, would strongly consider increasing to high intensity statin such as rosuavstatin 20 or 40 mg by mouth daily +/- ezetimibe 10 mg by mouth daily Recommend regular aerobic exercise Reviewed risks of hyperlipidemia, principles of treatment and consequences of untreated hyperlipidemia Discussed need for medication compliance At future visits, will further discuss the importance of smoking cessation Re-check lipid panel in 4-12 weeks  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 Will defer to nephrologist.  Discussed need for medication compliance Discussed need for improved control of blood pressure Discussed need for improved control of cholesterol  Patient Goals/Self-Care Activities Over the next 90 days, patient will:  Focus on medication adherence by keeping  up with prescription refills and either using a pill box or reminders to take your medications at the prescribed times Check blood pressure at least once  daily, document, and provide at future appointments  Follow Up Plan: Face to Face appointment with care management team member scheduled for: 08/01/21

## 2021-07-29 DIAGNOSIS — Z1211 Encounter for screening for malignant neoplasm of colon: Secondary | ICD-10-CM | POA: Diagnosis not present

## 2021-08-01 ENCOUNTER — Other Ambulatory Visit: Payer: Self-pay

## 2021-08-01 ENCOUNTER — Ambulatory Visit: Payer: Medicare HMO | Admitting: Pharmacist

## 2021-08-01 VITALS — BP 167/70 | HR 93

## 2021-08-01 DIAGNOSIS — E1122 Type 2 diabetes mellitus with diabetic chronic kidney disease: Secondary | ICD-10-CM

## 2021-08-01 DIAGNOSIS — I10 Essential (primary) hypertension: Secondary | ICD-10-CM

## 2021-08-01 DIAGNOSIS — N184 Chronic kidney disease, stage 4 (severe): Secondary | ICD-10-CM

## 2021-08-01 DIAGNOSIS — F17200 Nicotine dependence, unspecified, uncomplicated: Secondary | ICD-10-CM

## 2021-08-01 DIAGNOSIS — N1832 Chronic kidney disease, stage 3b: Secondary | ICD-10-CM

## 2021-08-01 MED ORDER — FERROUS SULFATE 325 (65 FE) MG PO TABS: 325.0000 mg | ORAL_TABLET | Freq: Every day | ORAL | 2 refills | Status: DC

## 2021-08-01 MED ORDER — GABAPENTIN 100 MG PO CAPS: 100.0000 mg | ORAL_CAPSULE | Freq: Three times a day (TID) | ORAL | 2 refills | Status: DC

## 2021-08-01 MED ORDER — CARVEDILOL 3.125 MG PO TABS: 3.1250 mg | ORAL_TABLET | Freq: Two times a day (BID) | ORAL | 2 refills | Status: DC

## 2021-08-01 MED ORDER — VARENICLINE TARTRATE 0.5 MG X 11 & 1 MG X 42 PO MISC
ORAL | 0 refills | Status: DC
Start: 2021-08-01 — End: 2021-10-11

## 2021-08-01 NOTE — Chronic Care Management (AMB) (Signed)
Chronic Care Management Pharmacy Note  08/01/2021 Name:  Deborah Gomez MRN:  [id number] DOB:  11-02-1946  Summary:  Blood pressure remains uncontrolled Continue hydralazine 25 mg by mouth three time daily and nifedipine ER 60 mg by mouth daily  Discussed with PCP and will have patient restart carvedilol 3.125 mg by mouth twice daily Can continue to titrate hydralazine and/or carvedilol if additional blood pressure lowering necessary Will defer to nephrology whether or not to restart losartan given current renal function. Patient has not been taking this. Discussed the importance of smoking cessation. Patient has cut down from 6-7 cigarettes per day to 3-4. Mostly smokes after drinking coffee and after each meal now. Patient would like to start Chantix. Discussed with PCP and agreed with plan. Prescription sent in to local pharmacy. Patient instructed to take Chantix with food (breakfast and lunch) Patient requested refill of gabapentin and ferrous sulfate. Discussed with PCP and will refill.   Subjective: Deborah Gomez is an 75 y.o. year old female who is a primary patient of Noreene Larsson, NP.  The CCM team was consulted for assistance with disease management and care coordination needs.    Engaged with patient face to face for follow up visit in response to provider referral for pharmacy case management and/or care coordination services.   Consent to Services:  The patient was given information about Chronic Care Management services, agreed to services, and gave verbal consent prior to initiation of services.  Please see initial visit note for detailed documentation.   Patient Care Team: Noreene Larsson, NP as PCP - General (Nurse Practitioner) Beryle Lathe, [facility] (Pharmacist)  Objective:  Lab Results  Component Value Date   CREATININE 1.67 (H) 07/12/2021   CREATININE 1.65 (H) 05/15/2021    Lab Results  Component Value Date   HGBA1C 6.4 (H) 05/15/2021   Last  diabetic Eye exam: No results found for: HMDIABEYEEXA  Last diabetic Foot exam: No results found for: HMDIABFOOTEX      Component Value Date/Time   CHOL 254 (H) 07/12/2021 1145   TRIG 122 07/12/2021 1145   HDL 79 07/12/2021 1145   LDLCALC 154 (H) 07/12/2021 1145    Hepatic Function Latest Ref Rng & Units 07/12/2021 05/15/2021  Total Protein 6.0 - 8.5 g/dL 6.5 6.7  Albumin 3.7 - 4.7 g/dL 4.0 3.5  AST 0 - 40 IU/L 19 20  ALT 0 - 32 IU/L 15 17  Alk Phosphatase 44 - 121 IU/L 108 85  Total Bilirubin 0.0 - 1.2 mg/dL <0.2 0.1(L)    Lab Results  Component Value Date/Time   TSH 0.984 07/12/2021 11:45 AM   TSH 1.895 05/16/2021 02:16 AM   FREET4 1.33 07/12/2021 11:45 AM    CBC Latest Ref Rng & Units 07/12/2021 05/15/2021  WBC 3.4 - 10.8 x10E3/uL 7.4 6.0  Hemoglobin 11.1 - 15.9 g/dL 11.7 13.0  Hematocrit 34.0 - 46.6 % 36.7 44.0  Platelets 150 - 450 x10E3/uL 287 215    Lab Results  Component Value Date/Time   VD25OH 14.7 (L) 07/12/2021 11:45 AM    Clinical ASCVD: No  The 10-year ASCVD risk score (Arnett DK, et al., 2019) is: 82.2%   Values used to calculate the score:     Age: 75 years     Sex: Female     Is Non-Hispanic African American: Yes     Diabetic: Yes     Tobacco smoker: Yes     Systolic Blood Pressure: 532 mmHg  Is BP treated: Yes     HDL Cholesterol: 79 mg/dL     Total Cholesterol: 254 mg/dL    Social History   Tobacco Use  Smoking Status Every Day   Packs/day: 0.50   Years: 52.00   Pack years: 26.00   Types: Cigarettes  Smokeless Tobacco Never   BP Readings from Last 3 Encounters:  08/01/21 (!) 167/70  07/12/21 (!) 159/78  06/06/21 (!) 180/73   Pulse Readings from Last 3 Encounters:  08/01/21 93  07/12/21 97  06/06/21 73   Wt Readings from Last 3 Encounters:  07/12/21 165 lb (74.8 kg)  06/06/21 167 lb (75.8 kg)  05/15/21 169 lb (76.7 kg)    Assessment: Review of patient past medical history, allergies, medications, health status, including  review of consultants reports, laboratory and other test data, was performed as part of comprehensive evaluation and provision of chronic care management services.   SDOH:  (Social Determinants of Health) assessments and interventions performed:    CCM Care Plan  No Known Allergies  Medications Reviewed Today     Reviewed by Beryle Lathe, [facility] (Pharmacist) on 08/01/21 at 1340  Med List Status: <None>   Medication Order Taking? Sig Documenting Provider Last Dose Status Informant  amLODipine (NORVASC) 10 MG tablet [id number] No Take 1 tablet (10 mg total) by mouth daily.  Patient not taking: No sig reported   Noreene Larsson, NP Not Taking Active   aspirin EC 81 MG tablet [id number] Yes Take 81 mg by mouth daily. Swallow whole. [provider] Taking Active   carvedilol (COREG) 3.125 MG tablet [id number] No Take 3.125 mg by mouth 2 (two) times daily with a meal.  Patient not taking: No sig reported   [provider] Not Taking Active            Med Note Beryle Lathe   Tue Aug 01, 2021  1:40 PM) Per patient, this was discontinued by Dr. Theador Hawthorne  ferrous sulfate 325 (65 FE) MG tablet [id number] No Take 1 tablet by mouth 2 (two) times daily.  Patient not taking: No sig reported   [provider] Not Taking Active   gabapentin (NEURONTIN) 100 MG capsule [id number] No Take 100 mg by mouth 3 (three) times daily.  Patient not taking: No sig reported   [provider] Not Taking Active   hydrALAZINE (APRESOLINE) 25 MG tablet [id number] Yes Take 25 mg by mouth 3 (three) times daily. [provider] Taking Active Self  losartan (COZAAR) 100 MG tablet [id number] No Take 100 mg by mouth daily.  Patient not taking: No sig reported   [provider] Not Taking Active   meclizine (ANTIVERT) 25 MG tablet [id number] Yes Take 25 mg by mouth 2 (two) times daily as needed for dizziness. [provider] Taking Active   NIFEdipine  (PROCARDIA XL/NIFEDICAL XL) 60 MG 24 hr tablet [id number] Yes Take 60 mg by mouth daily. [provider] Taking Active Self  pravastatin (PRAVACHOL) 80 MG tablet [id number] Yes Take 1 tablet (80 mg total) by mouth daily. Noreene Larsson, NP Taking Active            Med Note Beryle Lathe   Tue Aug 01, 2021  1:28 PM)    traMADol Veatrice Bourbon) 50 MG tablet [id number] Yes Take 50 mg by mouth 2 (two) times daily as needed. [provider] Taking Active   Vitamin D, Ergocalciferol, (DRISDOL) 1.25 MG (50000 UNIT) CAPS capsule [id number] Yes  Take 1 capsule (50,000 Units total) by mouth every 7 (seven) days. 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  Conditions to be addressed/monitored: HTN, HLD, DMII, CKD Stage 3b, and tobacco abuse  Care Plan : Medication Management  Updates made by Beryle Lathe, [place] since 08/01/2021 12:00 AM     Problem: HTN, T2DM, HLD, CKD, Tobacco Abuse   Priority: High  Onset Date: 07/20/2021     Long-Range Goal: Disease Progression Prevention   Start Date: 07/20/2021  Expected End Date: 10/18/2021  Recent Progress: On track  Priority: High  Note:   Current Barriers:  Unable to achieve control of hypertension  and hyperlipidemia  Pharmacist Clinical Goal(s):  Over the next 90 days, patient will Achieve control of hypertension  and hyperlipidemia as evidenced by improved blood pressure control and improved LDL through collaboration with  PharmD and provider.   Interventions: 1:1 collaboration with Noreene Larsson, NP regarding development and update of comprehensive plan of care as evidenced by provider attestation and co-signature Inter-disciplinary care team collaboration (see longitudinal plan of care) Comprehensive medication review performed; medication list updated in electronic medical record  Type 2 Diabetes: Current medications: none (patient has a history of taking metformin 500 mg by mouth twice daily with meals but will avoid use given current glomerular filtration rate) Intolerances: none Taking medications as directed: n/a Side effects thought to be attributed to current medication regimen: n/a Denies hypoglycemic/hyperglycemic symptoms Hypoglycemia prevention: not indicated at this time Current meal patterns: not discussed today Current exercise:  not discussed today On a statin: '[x]'  Yes  '[]'  No    Last microalbumin: unknown; on an ACEi/ARB: '[]'  Yes  '[x]'  No    Last eye exam: unknown Last foot exam: unknown Pneumonia vaccine: unknown Current glucose readings: will discuss at future visits Controlled; Most recent A1c at goal of <7% per ADA guidelines Patient identified as a good candidate for SGLT-2 inhibitor given reduction in cardiovascular disease, slowed chronic kidney disease progression, low risk of hypoglycemia, and blood pressure lowering. Patient denies a history of significant genitourinary infections. No concern for hypotension/volume depletion. GFR at least >20 mL/minute/1.73 m2. Will discuss at future visits.  Hypertension: Current medications: nifedipine 60 mg by mouth once daily and hydralazine 25 mg by mouth three times daily Patient was prescribed carvedilol 3.125 mg by mouth twice daily after hospital admission for hypertensive urgency but did not continue therapy therafter Intolerances: unknown; has a history of taking amlodipine-valsartan 10-320 mg by mouth daily, losartan 100 mg by mouth  daily, spironolactone 50 mg by mouth daily, and metoprolol  Taking medications as directed: no, patient is not taking losartan 100 mg anymore Side effects thought to be attributed to current medication regimen: no Home blood pressure readings: 140-150s/70s per verbal report from patient  Blood pressure under poor control. Blood pressure is above goal of <  130/80 mmHg per 2017 AHA/ACC guidelines. Working up for secondary causes of hypertension (Renal US 07/19/21) Encourage dietary sodium restriction/DASH diet Recommend regular aerobic exercise Recommend home blood pressure monitoring to discuss at next visit Reviewed risks of hypertension, principles of treatment and consequences of untreated hypertension Continue hydralazine 25 mg by mouth three time daily and nifedipine ER 60 mg by mouth daily  Discussed with PCP and will have patient restart carvedilol 3.125 mg by mouth twice daily Can continue to titrate hydralazine and/or carvedilol if additional blood pressure lowering necessary Will defer to nephrology whether or not to restart losartan  Hyperlipidemia: Current medications: pravastatin 80 mg by mouth once daily (recently increased from 40 mg by mouth daily due to elevated LDL) Intolerances: none Taking medications as directed: yes Side effects thought to be attributed to current medication regimen: no Uncontrolled; LDL above goal of <70 due to very high risk given diabetes + at least 1 additional major risk factor (elevated LDL cholesterol, hypertension, chronic kidney disease (CKD), and cigarette smoking) per 2020 AACE/ACE guidelines and TG at goal of <150 per 2020 AACE/ACE guidelines Continue pravastatin 80 mg by mouth once daily for now; however, given extremely elevated ASCVD risk, would strongly consider increasing to high intensity statin such as rosuavstatin 20 or 40 mg by mouth daily +/- ezetimibe 10 mg by mouth daily Recommend regular aerobic exercise Reviewed risks of  hyperlipidemia, principles of treatment and consequences of untreated hyperlipidemia Discussed need for medication compliance Discussed the importance of smoking cessation. Patient has cut down from 6-7 cigarettes per day to 3-4. Mostly smokes after drinking coffee and after each meal now. Patient would like to start Chantix. Discussed with PCP and agreed with plan. Prescription sent in to local pharmacy. Patient instructed to take Chantix with food (breakfast and lunch) Re-check lipid panel in 4-12 weeks  Chronic Kidney Disease Stage 3b - GFR 30-44 (Moderate to Severely Reduced Function): Patient reports that she sees Dr. Theador Hawthorne Current medications:  none Most recent GFR: 32 mL/min Most recent microalbumin: unknown Continue statin Consider addition of ARB, SGLT-2 inhibitor such as Jardiance or Farxiga, or spironolactone. Will defer to nephrologist.  Discussed need for medication compliance Discussed need for improved control of blood pressure Discussed need for improved control of cholesterol  Patient Goals/Self-Care Activities Over the next 90 days, patient will:  Focus on medication adherence by keeping up with prescription refills and either using a pill box or reminders to take your medications at the prescribed times Check blood pressure at least once daily, document, and provide at future appointments  Follow Up Plan: Face to Face appointment with care management team member scheduled for: 08/16/21      Medication Assistance: None required.  Patient affirms current coverage meets needs.  Patient's preferred pharmacy is:  [facility] ([facility] - [address], [address] [place] [address] Phone: [phone] Fax: [phone]  Follow Up:  Patient agrees to Care Plan and Follow-up.  Plan: Face to Face appointment with care management team member scheduled for: 08/16/21  Kennon Holter,  PharmD Clinical Pharmacist [facility] [phone]

## 2021-08-01 NOTE — Patient Instructions (Addendum)
Deborah Gomez,  It was great to talk to you today! We sent in refills for your gabapentin and ferrous sulfate to your mail order pharmacy. You should receive these within a week or two. We also sent in two medications to your local pharmacy. For blood pressure, we want you to restart taking carvedilol 3.125 mg by mouth twice daily with food. We may have to continue to increase your hydralazine and carvedilol until we get your blood pressure to a goal of <130/80. We also sent in an prescription for Chantix to help you stop smoking as we discussed.   Please ask your kidney doctor if you are supposed to be taking losartan 100 mg by mouth daily.  Please call me with any questions or concerns.   Visit Information  PATIENT GOALS:  Goals Addressed             This Visit's Progress    Medication Management       Patient Goals/Self-Care Activities Over the next 90 days, patient will:  Focus on medication adherence by keeping up with prescription refills and either using a pill box or reminders to take your medications at the prescribed times Check blood pressure at least once daily, document, and provide at future appointments         The patient verbalized understanding of instructions, educational materials, and care plan provided today and declined offer to receive copy of patient instructions, educational materials, and care plan.   Face to Face appointment with care management team member scheduled for: 08/16/21  Kennon Holter, PharmD Clinical Pharmacist [facility] [phone]

## 2021-08-03 ENCOUNTER — Ambulatory Visit (HOSPITAL_COMMUNITY)
Admission: RE | Admit: 2021-08-03 | Discharge: 2021-08-03 | Disposition: A | Discharge status: Home / Self Care | Payer: Medicare HMO | Source: Ambulatory Visit | Attending: Nephrology | Admitting: Nephrology

## 2021-08-03 ENCOUNTER — Other Ambulatory Visit: Payer: Self-pay | Admitting: Nurse Practitioner

## 2021-08-03 ENCOUNTER — Other Ambulatory Visit: Payer: Self-pay

## 2021-08-03 DIAGNOSIS — I10 Essential (primary) hypertension: Secondary | ICD-10-CM

## 2021-08-03 DIAGNOSIS — Z1211 Encounter for screening for malignant neoplasm of colon: Secondary | ICD-10-CM

## 2021-08-03 LAB — COLOGUARD: Cologuard: POSITIVE — AB

## 2021-08-03 NOTE — Progress Notes (Signed)
*  PRELIMINARY RESULTS* Echocardiogram 2D Echocardiogram has been performed.  Deborah Gomez 08/03/2021, 1:56 PM

## 2021-08-03 NOTE — Progress Notes (Signed)
Her cologuard was positive. That doesn't mean she has colon cancer, but it means she is high enough risk to warrant a colonoscopy. I sent a referral to the GI specialist.

## 2021-08-04 LAB — ECHOCARDIOGRAM COMPLETE
Area-P 1/2: 2.07 cm2
S' Lateral: 2.2 cm

## 2021-08-08 ENCOUNTER — Encounter: Payer: Self-pay | Admitting: Internal Medicine

## 2021-08-11 DIAGNOSIS — K5900 Constipation, unspecified: Secondary | ICD-10-CM | POA: Diagnosis not present

## 2021-08-11 DIAGNOSIS — R808 Other proteinuria: Secondary | ICD-10-CM | POA: Diagnosis not present

## 2021-08-11 DIAGNOSIS — N189 Chronic kidney disease, unspecified: Secondary | ICD-10-CM | POA: Diagnosis not present

## 2021-08-11 DIAGNOSIS — I350 Nonrheumatic aortic (valve) stenosis: Secondary | ICD-10-CM | POA: Diagnosis not present

## 2021-08-11 DIAGNOSIS — I129 Hypertensive chronic kidney disease with stage 1 through stage 4 chronic kidney disease, or unspecified chronic kidney disease: Secondary | ICD-10-CM | POA: Diagnosis not present

## 2021-08-11 DIAGNOSIS — E559 Vitamin D deficiency, unspecified: Secondary | ICD-10-CM | POA: Diagnosis not present

## 2021-08-11 DIAGNOSIS — Z716 Tobacco abuse counseling: Secondary | ICD-10-CM | POA: Diagnosis not present

## 2021-08-11 DIAGNOSIS — I5032 Chronic diastolic (congestive) heart failure: Secondary | ICD-10-CM | POA: Diagnosis not present

## 2021-08-11 DIAGNOSIS — E211 Secondary hyperparathyroidism, not elsewhere classified: Secondary | ICD-10-CM | POA: Diagnosis not present

## 2021-08-16 ENCOUNTER — Other Ambulatory Visit: Payer: Self-pay | Admitting: Nurse Practitioner

## 2021-08-16 ENCOUNTER — Other Ambulatory Visit: Payer: Self-pay

## 2021-08-16 ENCOUNTER — Encounter: Payer: Self-pay | Admitting: Nurse Practitioner

## 2021-08-16 ENCOUNTER — Ambulatory Visit: Payer: Medicare HMO | Admitting: Pharmacist

## 2021-08-16 ENCOUNTER — Ambulatory Visit (INDEPENDENT_AMBULATORY_CARE_PROVIDER_SITE_OTHER): Payer: Medicare HMO | Admitting: Nurse Practitioner

## 2021-08-16 VITALS — BP 162/82 | HR 77 | Temp 97.6°F | Ht 64.0 in | Wt 163.0 lb

## 2021-08-16 DIAGNOSIS — I10 Essential (primary) hypertension: Secondary | ICD-10-CM

## 2021-08-16 DIAGNOSIS — N1832 Chronic kidney disease, stage 3b: Secondary | ICD-10-CM

## 2021-08-16 DIAGNOSIS — N184 Chronic kidney disease, stage 4 (severe): Secondary | ICD-10-CM | POA: Diagnosis not present

## 2021-08-16 DIAGNOSIS — E782 Mixed hyperlipidemia: Secondary | ICD-10-CM

## 2021-08-16 DIAGNOSIS — E1122 Type 2 diabetes mellitus with diabetic chronic kidney disease: Secondary | ICD-10-CM

## 2021-08-16 DIAGNOSIS — E785 Hyperlipidemia, unspecified: Secondary | ICD-10-CM

## 2021-08-16 DIAGNOSIS — E611 Iron deficiency: Secondary | ICD-10-CM | POA: Diagnosis not present

## 2021-08-16 DIAGNOSIS — F17200 Nicotine dependence, unspecified, uncomplicated: Secondary | ICD-10-CM

## 2021-08-16 MED ORDER — EMPAGLIFLOZIN 10 MG PO TABS: 10.0000 mg | ORAL_TABLET | Freq: Every day | ORAL | 3 refills | Status: DC

## 2021-08-16 MED ORDER — ROSUVASTATIN CALCIUM 40 MG PO TABS
40.0000 mg | ORAL_TABLET | Freq: Every day | ORAL | 3 refills | Status: DC
Start: 2021-08-16 — End: 2021-11-01

## 2021-08-16 MED ORDER — LOSARTAN POTASSIUM 25 MG PO TABS: 25.0000 mg | ORAL_TABLET | Freq: Every day | ORAL | 3 refills | Status: DC

## 2021-08-16 NOTE — Patient Instructions (Addendum)
Follow-up with Gerald Stabs, our pharmacist, as scheduled.  We will recheck labs in 3 months. Please have fasting labs drawn 2-3 days prior to your appointment so we can discuss the results during your office visit.

## 2021-08-16 NOTE — Chronic Care Management (AMB) (Signed)
  Chronic Care Management   Note  08/16/2021 Name: Deborah Gomez MRN: [id number] DOB: Apr 24, 1946  Deborah Gomez is a 75 y.o. year old female who is a primary care patient of Noreene Larsson, NP. I reached out to [employer] by phone today.  Deborah Gomez was given information to apply for Medicare Extra Help [phone] for her Jardiance. Patient was also informed that losartan and rosuvastatin are $0 at [facility] per Kennon Holter Pharmacist and  Vania Rea price is high but if she gets medicare extra help it will be lower.    [facility]  Direct Dial: [phone]

## 2021-08-16 NOTE — Progress Notes (Signed)
Acute Office Visit  Subjective:    Patient ID: Deborah Gomez, female    DOB: 29-Oct-1946, 75 y.o.   MRN: [id number]  Chief Complaint  Patient presents with   Follow-up    1 month follow up    HPI Patient is in today for med check for HTN and DM. At her last OV, she stated that she was taking amlodipine and nifedipine, and she was supposed to bring in her current medications for medication reconciliation today. However, she completed a med rec with pharmacy on 07/20/21. She is taking hydralazine 25 mg TID, nifedipine ER 60 mg, and carvedilol 3.125 mg BID.  Nephrology restarted her losartan, but she states it was about $300 at the pharmacy.  She had borderline renal function, so metformin was stopped.  After getting her lab results, I called in Vit D and pravastatin 80 mg for her.  Past Medical History:  Diagnosis Date   Chronic kidney disease    Diabetes mellitus without complication ([place])    Hypertension     Past Surgical History:  Procedure Laterality Date   APPENDECTOMY     BREAST SURGERY Left    CARPAL TUNNEL RELEASE Right    REPLACEMENT TOTAL KNEE     THYROID SURGERY      History reviewed. No pertinent family history.  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  Tobacco Use   Smoking status: Every Day    Packs/day: 0.50    Years: 52.00    Pack years: 26.00    Types: Cigarettes   Smokeless tobacco: Never  Vaping Use   Vaping Use: Never used  Substance and Sexual Activity   Alcohol use: Not Currently   Drug use: Never   Sexual activity: Yes    Birth control/protection: Post-menopausal  Other Topics Concern   Not on file  Social History Narrative   1 child in [place], the other is not nearby   Social  Determinants of Radio broadcast assistant Strain: Low Risk    Difficulty of Paying Living Expenses: Not hard at all  Food Insecurity: No Food Insecurity   Worried About Charity fundraiser in the Last Year: Never true   Arboriculturist in the Last Year: Never true  Transportation Needs: No Transportation Needs   Lack of Transportation (Medical): No   Lack of Transportation (Non-Medical): No  Physical Activity: Insufficiently Active   Days of Exercise per Week: 3 days   Minutes of Exercise per Session: 40 min  Stress: No Stress Concern Present   Feeling of Stress : Not at all  Social Connections: Moderately Isolated   Frequency of Communication with Friends and Family: More than three times a week   Frequency of Social Gatherings with Friends and Family: More than three times a week   Attends Religious Services: 1 to 4 times per year   Active Member of [employer] or Organizations: No   Attends Archivist Meetings: Never   Marital Status: Widowed  Intimate Partner Violence: Not At Risk   Fear of Current or Ex-Partner: No   Emotionally Abused: No   Physically Abused: No   Sexually Abused: No    Outpatient  Medications Prior to Visit  Medication Sig Dispense Refill   aspirin EC 81 MG tablet Take 81 mg by mouth daily. Swallow whole.     carvedilol (COREG) 3.125 MG tablet Take 1 tablet (3.125 mg total) by mouth 2 (two) times daily with a meal. 60 tablet 2   gabapentin (NEURONTIN) 100 MG capsule Take 1 capsule (100 mg total) by mouth 3 (three) times daily. 90 capsule 2   hydrALAZINE (APRESOLINE) 25 MG tablet Take 25 mg by mouth 3 (three) times daily.     meclizine (ANTIVERT) 25 MG tablet Take 25 mg by mouth 2 (two) times daily as needed for dizziness.     NIFEdipine (PROCARDIA XL/NIFEDICAL XL) 60 MG 24 hr tablet Take 60 mg by mouth daily.     psyllium (REGULOID) 0.52 g capsule Take by mouth.     traMADol (ULTRAM) 50 MG tablet Take 50 mg by mouth 2 (two) times daily as needed.      varenicline (CHANTIX STARTING MONTH PAK) 0.5 MG X 11 & 1 MG X 42 tablet Take one 0.5 mg tablet by mouth once daily for 3 days, then increase to one 0.5 mg tablet twice daily for 4 days, then increase to one 1 mg tablet twice daily. Take with food. 1 each 0   Vitamin D, Ergocalciferol, (DRISDOL) 1.25 MG (50000 UNIT) CAPS capsule Take 1 capsule (50,000 Units total) by mouth every 7 (seven) days. 12 capsule 0   ferrous sulfate 325 (65 FE) MG tablet Take 1 tablet (325 mg total) by mouth daily with breakfast. 30 tablet 2   pravastatin (PRAVACHOL) 80 MG tablet Take 1 tablet (80 mg total) by mouth daily. 90 tablet 3   losartan (COZAAR) 100 MG tablet Take 100 mg by mouth daily. (Patient not taking: No sig reported)     No facility-administered medications prior to visit.    No Known Allergies  Review of Systems  Constitutional: Negative.   Respiratory: Negative.    Cardiovascular: Negative.   Musculoskeletal: Negative.   Psychiatric/Behavioral: Negative.        Objective:    Physical Exam Constitutional:      Appearance: Normal appearance.  Cardiovascular:     Rate and Rhythm: Normal rate and regular rhythm.     Pulses: Normal pulses.     Heart sounds: Normal heart sounds.  Pulmonary:     Effort: Pulmonary effort is normal.     Breath sounds: Normal breath sounds.  Musculoskeletal:        General: Normal range of motion.  Neurological:     Mental Status: She is alert.  Psychiatric:        Mood and Affect: Mood normal.        Behavior: Behavior normal.        Thought Content: Thought content normal.        Judgment: Judgment normal.    BP (!) 162/82 (BP Location: Left Arm, Patient Position: Sitting, Cuff Size: Normal)   Pulse 77   Temp 97.6 F (36.4 C) (Oral)   Ht 5' 4" (1.626 m)   Wt 163 lb 0.6 oz (74 kg)   SpO2 98%   BMI 27.99 kg/m  Wt Readings from Last 3 Encounters:  08/16/21 163 lb 0.6 oz (74 kg)  07/12/21 165 lb (74.8 kg)  06/06/21 167 lb (75.8 kg)    Health  Maintenance Due  Topic Date Due   FOOT EXAM  Never done   OPHTHALMOLOGY EXAM  Never done   Hepatitis  C Screening  Never done   COLONOSCOPY (Pts 45-8yr Insurance coverage will need to be confirmed)  Never done   DEXA SCAN  Never done    There are no preventive care reminders to display for this patient.   Lab Results  Component Value Date   TSH 0.984 07/12/2021   Lab Results  Component Value Date   WBC 7.4 07/12/2021   HGB 11.7 07/12/2021   HCT 36.7 07/12/2021   MCV 86 07/12/2021   PLT 287 07/12/2021   Lab Results  Component Value Date   NA 142 07/12/2021   K 5.1 07/12/2021   CO2 24 07/12/2021   GLUCOSE 95 07/12/2021   BUN 20 07/12/2021   CREATININE 1.67 (H) 07/12/2021   BILITOT <0.2 07/12/2021   ALKPHOS 108 07/12/2021   AST 19 07/12/2021   ALT 15 07/12/2021   PROT 6.5 07/12/2021   ALBUMIN 4.0 07/12/2021   CALCIUM 9.5 07/12/2021   ANIONGAP 6 05/15/2021   EGFR 32 (L) 07/12/2021   Lab Results  Component Value Date   CHOL 254 (H) 07/12/2021   Lab Results  Component Value Date   HDL 79 07/12/2021   Lab Results  Component Value Date   LDLCALC 154 (H) 07/12/2021   Lab Results  Component Value Date   TRIG 122 07/12/2021   No results found for: CHOLHDL Lab Results  Component Value Date   HGBA1C 6.4 (H) 05/15/2021       Assessment & Plan:   Problem List Items Addressed This Visit       Cardiovascular and Mediastinum   HTN (hypertension), malignant    -taking hydralazine, nifedipine, and carvedilol -Rx. Losartan; 25 mg per Dr. BToya Smothersnote      Relevant Medications   losartan (COZAAR) 25 MG tablet   rosuvastatin (CRESTOR) 40 MG tablet     Endocrine   DMII (diabetes mellitus, type 2) ([place] - Primary    -metformin stopped previously d/t poor renal fcn -Rx. 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 BTheador Hawthorne-he restarted her on losartan, but pt states meds were $300 -refilled losartan (per GoodRx, should be $24 for a 90-day supply)        Other   Iron deficiency    Lab Results  Component Value Date   IRON 86 07/12/2021   TIBC 236 (L) 07/12/2021   FERRITIN 337 (H) 07/12/2021  -STOP iron d/t high iron levels currently      HLD (hyperlipidemia)    Lab Results  Component Value Date   CHOL 254 (H) 07/12/2021   HDL 79 07/12/2021   LDLCALC 154 (H) 07/12/2021   TRIG 122 07/12/2021  -Rx. Rosuvastatin -STOP pravastatin d/t increasing intensity      Relevant Medications   losartan (COZAAR) 25 MG tablet   rosuvastatin (CRESTOR) 40 MG tablet     Meds ordered this encounter  Medications   losartan (COZAAR) 25 MG tablet    Sig: Take 1 tablet (25 mg total) by mouth daily.    Dispense:  90 tablet    Refill:  3   empagliflozin (JARDIANCE) 10 MG TABS tablet    Sig: Take 1 tablet (10 mg total) by mouth daily.    Dispense:  90 tablet    Refill:  3   rosuvastatin (CRESTOR) 40 MG tablet    Sig: Take 1 tablet (40 mg total) by mouth daily.    Dispense:  90 tablet    Refill:  [place], NP

## 2021-08-16 NOTE — Assessment & Plan Note (Signed)
Lab Results  Component Value Date   CHOL 254 (H) 07/12/2021   HDL 79 07/12/2021   LDLCALC 154 (H) 07/12/2021   TRIG 122 07/12/2021   -Rx. Rosuvastatin -STOP pravastatin d/t increasing intensity

## 2021-08-16 NOTE — Assessment & Plan Note (Signed)
-  metformin stopped previously d/t poor renal fcn -Rx. Jardiance

## 2021-08-16 NOTE — Patient Instructions (Signed)
Deborah Gomez,  It was great to talk to you today!  Please call me with any questions or concerns.   Visit Information  PATIENT GOALS:  Goals Addressed             This Visit's Progress    Medication Management       Patient Goals/Self-Care Activities Over the next 90 days, patient will:  Focus on medication adherence by keeping up with prescription refills and either using a pill box or reminders to take your medications at the prescribed times Check blood pressure at least once daily, document, and provide at future appointments            The patient verbalized understanding of instructions, educational materials, and care plan provided today and declined offer to receive copy of patient instructions, educational materials, and care plan.   Face to Face appointment with care management team member scheduled for: 08/31/21  Kennon Holter, PharmD Clinical Pharmacist [facility] [phone]

## 2021-08-16 NOTE — Assessment & Plan Note (Signed)
-  followed by Dr. Theador Hawthorne -he restarted her on losartan, but pt states meds were $300 -refilled losartan (per [facility]Rx, should be $24 for a 90-day supply)

## 2021-08-16 NOTE — Assessment & Plan Note (Signed)
-  taking hydralazine, nifedipine, and carvedilol -Rx. Losartan; 25 mg per Dr. Toya Smothers note

## 2021-08-16 NOTE — Chronic Care Management (AMB) (Signed)
Chronic Care Management Pharmacy Note  08/16/2021 Name:  Deborah Gomez MRN:  [id number] DOB:  [birth year]  Summary:  Deborah Gomez pharmacy and co-pay for Deborah Gomez will be $45 per month of $135 per 3 months. Given patient's reported financial situation. Will recommend that the patient apply for Medicare low income subsidy. If denied, then can apply for manufacturer assistance Patient was instructed to restart losartan per nephrology but has not started taking it yet since she was told the price was $300. [facility] was contacted to confirm pricing. Losartan co-pay is $0.  Check BMP 1-2 weeks after starting losartan per nephrology  Per encounter with PCP today, plan to switch from pravastatin to rosuvastatin 40 mg by mouth daily given extremely elevated ASCVD risk. Co-pay for rosuvastatin is $0 per [facility].  Discussed the importance of smoking cessation. Patient has cut down from 6-7 cigarettes per day to 2-3. Mostly smokes after drinking coffee and after each meal now. Patient tolerating Chantix twice daily and plans to continue therapy.  Re-check lipid panel in 4-12 weeks and consider adding ezetimibe 10 mg by mouth daily if LDL remains above goal  Subjective: Deborah Gomez is an 75 y.o. year old female who is a primary patient of Deborah Larsson, NP.  The CCM team was consulted for assistance with disease management and care coordination needs.    Engaged with patient face to face for follow up visit in response to provider referral for pharmacy case management and/or care coordination services.   Consent to Services:  The patient was given information about Chronic Care Management services, agreed to services, and gave verbal consent prior to initiation of services.  Please see initial visit note for detailed documentation.   Patient Care Team: Deborah Larsson, NP as PCP - General (Nurse Practitioner) Deborah Gomez, [facility] (Pharmacist)  Objective:  Lab Results   Component Value Date   CREATININE 1.67 (H) 07/12/2021   CREATININE 1.65 (H) 05/15/2021    Lab Results  Component Value Date   HGBA1C 6.4 (H) 05/15/2021   Last diabetic Eye exam: No results found for: HMDIABEYEEXA  Last diabetic Foot exam: No results found for: HMDIABFOOTEX      Component Value Date/Time   CHOL 254 (H) 07/12/2021 1145   TRIG 122 07/12/2021 1145   HDL 79 07/12/2021 1145   LDLCALC 154 (H) 07/12/2021 1145    Hepatic Function Latest Ref Rng & Units 07/12/2021 05/15/2021  Total Protein 6.0 - 8.5 g/dL 6.5 6.7  Albumin 3.7 - 4.7 g/dL 4.0 3.5  AST 0 - 40 IU/L 19 20  ALT 0 - 32 IU/L 15 17  Alk Phosphatase 44 - 121 IU/L 108 85  Total Bilirubin 0.0 - 1.2 mg/dL <0.2 0.1(L)    Lab Results  Component Value Date/Time   TSH 0.984 07/12/2021 11:45 AM   TSH 1.895 05/16/2021 02:16 AM   FREET4 1.33 07/12/2021 11:45 AM    CBC Latest Ref Rng & Units 07/12/2021 05/15/2021  WBC 3.4 - 10.8 x10E3/uL 7.4 6.0  Hemoglobin 11.1 - 15.9 g/dL 11.7 13.0  Hematocrit 34.0 - 46.6 % 36.7 44.0  Platelets 150 - 450 x10E3/uL 287 215    Lab Results  Component Value Date/Time   VD25OH 14.7 (L) 07/12/2021 11:45 AM    Clinical ASCVD: No  The 10-year ASCVD risk score (Arnett DK, et al., 2019) is: 80.7%   Values used to calculate the score:     Age: 15 years     Sex: Female  Is Non-Hispanic African American: Yes     Diabetic: Yes     Tobacco smoker: Yes     Systolic Blood Pressure: 728 mmHg     Is BP treated: Yes     HDL Cholesterol: 79 mg/dL     Total Cholesterol: 254 mg/dL    Social History   Tobacco Use  Smoking Status Every Day   Packs/day: 0.50   Years: 52.00   Pack years: 26.00   Types: Cigarettes  Smokeless Tobacco Never   BP Readings from Last 3 Encounters:  08/16/21 (!) 162/82  08/01/21 (!) 167/70  07/12/21 (!) 159/78   Pulse Readings from Last 3 Encounters:  08/16/21 77  08/01/21 93  07/12/21 97   Wt Readings from Last 3 Encounters:  08/16/21 163 lb 0.6  oz (74 kg)  07/12/21 165 lb (74.8 kg)  06/06/21 167 lb (75.8 kg)    Assessment: Review of patient past medical history, allergies, medications, health status, including review of consultants reports, laboratory and other test data, was performed as part of comprehensive evaluation and provision of chronic care management services.   SDOH:  (Social Determinants of Health) assessments and interventions performed:    CCM Care Plan  No Known Allergies  Medications Reviewed Today     Reviewed by Deborah Gomez, [facility] (Pharmacist) on 08/16/21 at 1523  Med List Status: <None>   Medication Order Taking? Sig Documenting Provider Last Dose Status Informant  aspirin EC 81 MG tablet [id number] Yes Take 81 mg by mouth daily. Swallow whole. [provider] Taking Active   carvedilol (COREG) 3.125 MG tablet [id number] Yes Take 1 tablet (3.125 mg total) by mouth 2 (two) times daily with a meal. Deborah Larsson, NP Taking Active   empagliflozin (JARDIANCE) 10 MG TABS tablet [id number]  Take 1 tablet (10 mg total) by mouth daily. Deborah Larsson, NP  Active   gabapentin (NEURONTIN) 100 MG capsule [id number] Yes Take 1 capsule (100 mg total) by mouth 3 (three) times daily. Deborah Larsson, NP Taking Active   hydrALAZINE (APRESOLINE) 25 MG tablet [id number] Yes Take 25 mg by mouth 3 (three) times daily. [provider] Taking Active Self  losartan (COZAAR) 25 MG tablet [id number] No Take 1 tablet (25 mg total) by mouth daily.  Patient not taking: Reported on 08/16/2021   Deborah Larsson, NP Not Taking Active   meclizine (ANTIVERT) 25 MG tablet [id number] Yes Take 25 mg by mouth 2 (two) times daily as needed for dizziness. [provider] Taking Active   NIFEdipine (PROCARDIA XL/NIFEDICAL XL) 60 MG 24 hr tablet [id number] Yes Take 60 mg by mouth daily. [provider] Taking Active Self  psyllium (REGULOID) 0.52 g capsule [id number] Yes Take by mouth. [provider]  Taking Active   rosuvastatin (CRESTOR) 40 MG tablet [id number]  Take 1 tablet (40 mg total) by mouth daily. Deborah Larsson, NP  Active   traMADol Veatrice Bourbon) 50 MG tablet [id number] Yes Take 50 mg by mouth 2 (two) times daily as needed. [provider] Taking Active   varenicline (CHANTIX STARTING MONTH PAK) 0.5 MG X 11 & 1 MG X 42 tablet [id number] Yes Take one 0.5 mg tablet by mouth once daily for 3 days, then increase to one 0.5 mg tablet twice daily for 4 days, then increase to one 1 mg tablet twice daily. Take with food. Deborah Larsson, NP Taking Active   Vitamin D, Ergocalciferol, (DRISDOL) 1.25 MG (50000 UNIT) CAPS capsule [id number]  Yes Take 1 capsule (50,000 Units total) by mouth every 7 (seven) days. 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  Conditions to be addressed/monitored: HTN, HLD, DMII, CKD Stage 3b, and tobacco abuse  Care Plan : Medication Management  Updates made by Deborah Gomez, [place] since 08/16/2021 12:00 AM     Problem: HTN, T2DM, HLD, CKD, Tobacco Abuse   Priority: High  Onset Date: 07/20/2021     Long-Range Goal: Disease Progression Prevention   Start Date: 07/20/2021  Expected End Date: 10/18/2021  Recent Progress: On track  Priority: High  Note:   Current Barriers:  Unable to achieve control of hypertension  and hyperlipidemia  Pharmacist Clinical Goal(s):  Over the next 90 days, patient will Achieve control of  hypertension  and hyperlipidemia as evidenced by improved blood pressure control and improved LDL through collaboration with PharmD and provider.   Interventions: 1:1 collaboration with Deborah Larsson, NP regarding development and update of comprehensive plan of care as evidenced by provider attestation and co-signature Inter-disciplinary care team collaboration (see longitudinal plan of care) Comprehensive medication review performed; medication list updated in electronic medical record  Type 2 Diabetes: Current medications: empagliflozin (Jardiance) 10 mg by mouth daily (prescribed by PCP on 08/16/21) Patient has a history of taking metformin 500 mg by mouth twice daily with meals but will avoid use given current glomerular filtration rate Intolerances: none Taking medications as directed: n/a Side effects thought to be attributed to current medication regimen: n/a Denies hypoglycemic/hyperglycemic symptoms Hypoglycemia prevention: not indicated at this time Current meal patterns: not discussed today Current exercise:  not discussed today On a statin: '[x]'  Yes  '[]'  No    Last microalbumin: unknown; on an ACEi/ARB: '[]'  Yes  '[x]'  No    Current glucose readings: will discuss at future visits Controlled; Most recent A1c at goal of <7% per ADA guidelines Called pharmacy and co-pay for Deborah Gomez will be $45 per month of $135 per 3 months. Given patient's reported financial situation. Will recommend that the patient apply for Medicare low income subsidy. If denied, then can apply for manufacturer assistance  Hypertension: Current medications: losartan 25 mg by mouth once daily, nifedipine 60 mg by mouth once daily, carvedilol 3.125 mg by mouth twice daily, and hydralazine 25 mg by mouth three times daily Intolerances: unknown; has a history of taking amlodipine-valsartan 10-320 mg by mouth daily, losartan 100 mg by mouth daily, spironolactone 50 mg by mouth daily, and metoprolol  Taking medications  as directed: no, patient was instructed to restart losartan per nephrology but has not started taking it yet since she was told the price was $300 Side effects thought to be attributed to current medication regimen: no Home blood pressure readings: 120-140s/70s per verbal report from patient  Blood pressure under poor control. Blood pressure is above  goal of <130/80 mmHg per 2017 AHA/ACC guidelines. Working up for secondary causes of hypertension (Renal US 07/19/21) Encourage dietary sodium restriction/DASH diet Recommend regular aerobic exercise Recommend home blood pressure monitoring to discuss at next visit Reviewed risks of hypertension, principles of treatment and consequences of untreated hypertension Continue current medications as above Check BMP 1-2 weeks after starting losartan per nephrology  [facility] pharmacy was contacted to confirm pricing. Losartan co-pay is $0.   Hyperlipidemia: Current medications: rosuvastatin 40 mg by mouth once daily PCP discontinued pravastatin 80 mg by mouth daily on 08/16/21 with plan to intensity statin regimen due to elevated LDL despite moderate intensity statin Intolerances: none Taking medications as directed: yes Side effects thought to be attributed to current medication regimen: no Uncontrolled; LDL above goal of <70 due to very high risk given diabetes + at least 1 additional major risk factor (elevated LDL cholesterol, hypertension, chronic kidney disease (CKD), and cigarette smoking) per 2020 AACE/ACE guidelines and TG at goal of <150 per 2020 AACE/ACE guidelines Per encounter with PCP today, plan to switch from pravastatin to rosuvastatin 40 mg by mouth daily given extremely elevated ASCVD risk. Co-pay for rosuvastatin is $0 per [place].  Recommend regular aerobic exercise Reviewed risks of hyperlipidemia, principles of treatment and consequences of untreated hyperlipidemia Discussed need for medication compliance Discussed the  importance of smoking cessation. Patient has cut down from 6-7 cigarettes per day to 2-3. Mostly smokes after drinking coffee and after each meal now. Patient tolerating Chantix twice daily and plans to continue therapy.  Re-check lipid panel in 4-12 weeks and consider adding ezetimibe 10 mg by mouth daily if LDL remains above goal  Chronic Kidney Disease Stage 3b - GFR 30-44 (Moderate to Severely Reduced Function): Followed by Dr. Theador Hawthorne Current medications: none but plan to start losartan 25 mg by mouth once daily and empagliflozin (Jardiance) 10 mg by mouth once daily Most recent GFR: 32 mL/min Most recent microalbumin: unknown Continue statin Plan to add ARB and SGLT2 inhibitor. Need to check BMP with nephrology 1-2 weeks after initiation Discussed need for medication compliance Discussed need for improved control of blood pressure Discussed need for improved control of cholesterol  Patient Goals/Self-Care Activities Over the next 90 days, patient will:  Focus on medication adherence by keeping up with prescription refills and either using a pill box or reminders to take your medications at the prescribed times Check blood pressure at least once daily, document, and provide at future appointments  Follow Up Plan: Face to Face appointment with care management team member scheduled for: 08/31/21      Medication Assistance:  to be determined  Patient's preferred pharmacy is:  [facility], [address] [place] [address] Phone: [phone] Fax: [phone]  Follow Up:  Patient agrees to Care Plan and Follow-up.  Plan: Face to Face appointment with care management team member scheduled for: 08/31/21  Kennon Holter, PharmD Clinical Pharmacist [facility] [phone]

## 2021-08-16 NOTE — Assessment & Plan Note (Signed)
Lab Results  Component Value Date   IRON 86 07/12/2021   TIBC 236 (L) 07/12/2021   FERRITIN 337 (H) 07/12/2021   -STOP iron d/t high iron levels currently

## 2021-08-18 DIAGNOSIS — N1832 Chronic kidney disease, stage 3b: Secondary | ICD-10-CM | POA: Diagnosis not present

## 2021-08-18 DIAGNOSIS — E1122 Type 2 diabetes mellitus with diabetic chronic kidney disease: Secondary | ICD-10-CM | POA: Diagnosis not present

## 2021-08-18 DIAGNOSIS — E785 Hyperlipidemia, unspecified: Secondary | ICD-10-CM | POA: Diagnosis not present

## 2021-08-18 DIAGNOSIS — N184 Chronic kidney disease, stage 4 (severe): Secondary | ICD-10-CM

## 2021-08-18 DIAGNOSIS — I10 Essential (primary) hypertension: Secondary | ICD-10-CM | POA: Diagnosis not present

## 2021-08-23 ENCOUNTER — Ambulatory Visit (HOSPITAL_COMMUNITY)
Admission: RE | Admit: 2021-08-23 | Discharge: 2021-08-23 | Disposition: A | Discharge status: Home / Self Care | Payer: Medicare HMO | Source: Ambulatory Visit | Attending: Nurse Practitioner | Admitting: Nurse Practitioner

## 2021-08-23 ENCOUNTER — Other Ambulatory Visit: Payer: Self-pay

## 2021-08-23 DIAGNOSIS — Z13820 Encounter for screening for osteoporosis: Secondary | ICD-10-CM

## 2021-08-23 DIAGNOSIS — Z780 Asymptomatic menopausal state: Secondary | ICD-10-CM | POA: Diagnosis not present

## 2021-08-23 DIAGNOSIS — E559 Vitamin D deficiency, unspecified: Secondary | ICD-10-CM | POA: Diagnosis not present

## 2021-08-24 NOTE — Progress Notes (Signed)
T-score was great at 0, so you aren't even close to having osteoporosis!

## 2021-08-31 ENCOUNTER — Other Ambulatory Visit: Payer: Self-pay

## 2021-08-31 ENCOUNTER — Ambulatory Visit (INDEPENDENT_AMBULATORY_CARE_PROVIDER_SITE_OTHER): Payer: Medicare HMO | Admitting: Pharmacist

## 2021-08-31 VITALS — BP 156/76 | HR 68

## 2021-08-31 DIAGNOSIS — F17200 Nicotine dependence, unspecified, uncomplicated: Secondary | ICD-10-CM

## 2021-08-31 DIAGNOSIS — E1122 Type 2 diabetes mellitus with diabetic chronic kidney disease: Secondary | ICD-10-CM

## 2021-08-31 DIAGNOSIS — N1832 Chronic kidney disease, stage 3b: Secondary | ICD-10-CM

## 2021-08-31 DIAGNOSIS — E785 Hyperlipidemia, unspecified: Secondary | ICD-10-CM

## 2021-08-31 DIAGNOSIS — I10 Essential (primary) hypertension: Secondary | ICD-10-CM

## 2021-08-31 NOTE — Chronic Care Management (AMB) (Signed)
Chronic Care Management Pharmacy Note  08/31/2021 Name:  Deborah Gomez MRN:  [id number] DOB:  [birth year]  Summary:  Type 2 Diabetes: Patient reports taking Jardiance but it is not showing in dispense history. Unsure if patient actually taking. Called pharmacy and co-pay for Vania Rea will be $45 per month of $135 per 3 months. Given patient's reported financial situation. Will recommend that the patient apply for Medicare low income subsidy. If denied, then can apply for manufacturer assistance.  Hypertension: Current medications: losartan 25 mg by mouth once daily, nifedipine 60 mg by mouth once daily, carvedilol 3.125 mg by mouth twice daily, and hydralazine 25 mg by mouth three times daily Home blood pressure readings: 120-130s/70s per verbal report from patient  Blood pressure control is unclear. High today, but pt reports normal blood pressures when monitors outside of office. Blood pressure goal is <130/80 mmHg per 2017 AHA/ACC guidelines. Continue current medications as above. Instructed patient to bring home blood pressure cuff to next visit to validate readings.   Hyperlipidemia: Continue rosuvastatin 40 mg by mouth daily given extremely elevated ASCVD risk Patient reports that she has stopped smoking with the use of Chantix. She plans to finish out current box of Chantix and then discontinue. Will let me know if she gets cravings to smoke and needs to restart therapy.  Re-check lipid panel at next PCP visit. Consider adding ezetimibe 10 mg by mouth daily if LDL remains above goal  Subjective: Deborah Gomez is an 75 y.o. year old female who is a primary patient of Noreene Larsson, NP.  The CCM team was consulted for assistance with disease management and care coordination needs.    Engaged with patient face to face for follow up visit in response to provider referral for pharmacy case management and/or care coordination services.   Consent to Services:  The patient was  given information about Chronic Care Management services, agreed to services, and gave verbal consent prior to initiation of services.  Please see initial visit note for detailed documentation.   Patient Care Team: Noreene Larsson, NP as PCP - General (Nurse Practitioner) Beryle Lathe, [facility] (Pharmacist)  Objective:  Lab Results  Component Value Date   CREATININE 1.67 (H) 07/12/2021   CREATININE 1.65 (H) 05/15/2021    Lab Results  Component Value Date   HGBA1C 6.4 (H) 05/15/2021   Last diabetic Eye exam: No results found for: HMDIABEYEEXA  Last diabetic Foot exam: No results found for: HMDIABFOOTEX      Component Value Date/Time   CHOL 254 (H) 07/12/2021 1145   TRIG 122 07/12/2021 1145   HDL 79 07/12/2021 1145   LDLCALC 154 (H) 07/12/2021 1145    Hepatic Function Latest Ref Rng & Units 07/12/2021 05/15/2021  Total Protein 6.0 - 8.5 g/dL 6.5 6.7  Albumin 3.7 - 4.7 g/dL 4.0 3.5  AST 0 - 40 IU/L 19 20  ALT 0 - 32 IU/L 15 17  Alk Phosphatase 44 - 121 IU/L 108 85  Total Bilirubin 0.0 - 1.2 mg/dL <0.2 0.1(L)    Lab Results  Component Value Date/Time   TSH 0.984 07/12/2021 11:45 AM   TSH 1.895 05/16/2021 02:16 AM   FREET4 1.33 07/12/2021 11:45 AM    CBC Latest Ref Rng & Units 07/12/2021 05/15/2021  WBC 3.4 - 10.8 x10E3/uL 7.4 6.0  Hemoglobin 11.1 - 15.9 g/dL 11.7 13.0  Hematocrit 34.0 - 46.6 % 36.7 44.0  Platelets 150 - 450 x10E3/uL 287 215    Lab  Results  Component Value Date/Time   VD25OH 14.7 (L) 07/12/2021 11:45 AM    Clinical ASCVD: No  The 10-year ASCVD risk score (Arnett DK, et al., 2019) is: 78.9%   Values used to calculate the score:     Age: 75 years     Sex: Female     Is Non-Hispanic African American: Yes     Diabetic: Yes     Tobacco smoker: Yes     Systolic Blood Pressure: 315 mmHg     Is BP treated: Yes     HDL Cholesterol: 79 mg/dL     Total Cholesterol: 254 mg/dL     Social History   Tobacco Use  Smoking Status Former    Packs/day: 0.50   Years: 52.00   Pack years: 26.00   Types: Cigarettes   Quit date: 08/21/2021   Years since quitting: 0.0  Smokeless Tobacco Never   BP Readings from Last 3 Encounters:  08/31/21 (!) 156/76  08/16/21 (!) 162/82  08/01/21 (!) 167/70   Pulse Readings from Last 3 Encounters:  08/31/21 68  08/16/21 77  08/01/21 93   Wt Readings from Last 3 Encounters:  08/16/21 163 lb 0.6 oz (74 kg)  07/12/21 165 lb (74.8 kg)  06/06/21 167 lb (75.8 kg)    Assessment: Review of patient past medical history, allergies, medications, health status, including review of consultants reports, laboratory and other test data, was performed as part of comprehensive evaluation and provision of chronic care management services.   SDOH:  (Social Determinants of Health) assessments and interventions performed:    CCM Care Plan  No Known Allergies  Medications Reviewed Today     Reviewed by Beryle Lathe, [facility] (Pharmacist) on 08/31/21 at 1342  Med List Status: <None>   Medication Order Taking? Sig Documenting Provider Last Dose Status Informant  aspirin EC 81 MG tablet [id number] Yes Take 81 mg by mouth daily. Swallow whole. [provider] Taking Active   carvedilol (COREG) 3.125 MG tablet [id number] Yes Take 1 tablet (3.125 mg total) by mouth 2 (two) times daily with a meal. Noreene Larsson, NP Taking Active   empagliflozin (JARDIANCE) 10 MG TABS tablet [id number] Yes Take 1 tablet (10 mg total) by mouth daily. Noreene Larsson, NP Taking Active   gabapentin (NEURONTIN) 100 MG capsule [id number] Yes Take 1 capsule (100 mg total) by mouth 3 (three) times daily. Noreene Larsson, NP Taking Active   hydrALAZINE (APRESOLINE) 25 MG tablet [id number] Yes Take 25 mg by mouth 3 (three) times daily. [provider] Taking Active Self  losartan (COZAAR) 25 MG tablet [id number] Yes Take 1 tablet (25 mg total) by mouth daily. Noreene Larsson, NP Taking Active   meclizine (ANTIVERT) 25 MG  tablet [id number] No Take 25 mg by mouth 2 (two) times daily as needed for dizziness.  Patient not taking: Reported on 08/31/2021   [provider] Not Taking Active   NIFEdipine (PROCARDIA XL/NIFEDICAL XL) 60 MG 24 hr tablet [id number] Yes Take 60 mg by mouth daily. [provider] Taking Active Self  psyllium (REGULOID) 0.52 g capsule [id number] Yes Take by mouth. [provider] Taking Active   rosuvastatin (CRESTOR) 40 MG tablet [id number] Yes Take 1 tablet (40 mg total) by mouth daily. Noreene Larsson, NP Taking Active   traMADol Veatrice Bourbon) 50 MG tablet [id number] No Take 50 mg by mouth 2 (two) times daily as needed.  Patient not taking: Reported on 08/31/2021   [provider] Not Taking Active   varenicline (CHANTIX STARTING MONTH PAK) 0.5 MG X 11 & 1 MG X 42 tablet [id number] Yes Take one 0.5 mg tablet by mouth once daily for 3 days, then increase to one 0.5 mg tablet twice daily for 4 days, then increase to one 1 mg tablet twice daily. Take with food. Noreene Larsson, NP Taking Active   Vitamin D, Ergocalciferol, (DRISDOL) 1.25 MG (50000 UNIT) CAPS capsule [id number] Yes Take 1 capsule (50,000 Units total) by mouth every 7 (seven) days. 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  Conditions to be addressed/monitored: HTN, HLD, DMII, CKD Stage 3b, and tobacco use  Care Plan : Medication Management  Updates made by Beryle Lathe, [place] since 08/31/2021 12:00 AM     Problem: HTN, T2DM, HLD, CKD, Tobacco Abuse   Priority: High  Onset Date: 07/20/2021     Long-Range Goal: Disease Progression Prevention   Start Date: 07/20/2021  Expected End Date: 10/18/2021  Recent Progress: On track  Priority: High  Note:   Current Barriers:  Unable to achieve control of hypertension  and hyperlipidemia  Pharmacist Clinical Goal(s):  Over the next 90 days, patient will Achieve control of hypertension  and hyperlipidemia as evidenced by improved blood pressure control and improved LDL through collaboration with PharmD and provider.   Interventions: 1:1 collaboration with Noreene Larsson, NP regarding development and update of comprehensive plan of care as evidenced by provider attestation and co-signature Inter-disciplinary care team collaboration (see longitudinal plan of care) Comprehensive medication review performed; medication list updated in electronic medical record  Type 2 Diabetes: Current medications: empagliflozin (Jardiance) 10 mg by mouth daily No metformin due to renal function Intolerances: none Taking medications as directed: n/a Side effects thought to be attributed to current medication regimen: n/a Denies hypoglycemic/hyperglycemic symptoms Hypoglycemia prevention: not indicated at this time Current meal patterns: not discussed today Current exercise:  not discussed today On a statin: _0  Yes  _1  No    Last microalbumin: unknown; on an ACEi/ARB: _2  Yes  _3  No    Current glucose readings: will discuss at future visits Controlled; Most recent A1c at goal of <7% per ADA guidelines Patient reports taking Jardiance but it is not showing in dispense history. Unsure if patient actually taking. Called pharmacy and co-pay for Vania Rea will be $45 per month of $135 per 3 months. Given patient's reported financial situation. Will recommend that the patient apply for Medicare low income subsidy. If denied,  then can apply for manufacturer assistance.  Hypertension: Current medications: losartan 25 mg by mouth once daily, nifedipine 60 mg by mouth once daily, carvedilol 3.125 mg by mouth twice daily, and hydralazine 25 mg by mouth three times daily Intolerances: unknown Taking medications as directed: yes Side effects thought to be attributed to current  medication regimen: no Home blood pressure readings: 120-130s/70s per verbal report from patient  Blood pressure control is unclear. High today, but pt reports normal blood pressures when monitors outside of office. Blood pressure goal is <130/80 mmHg per 2017 AHA/ACC guidelines. Working up for secondary causes of hypertension (Renal US 07/19/21) Encourage dietary sodium restriction/DASH diet Recommend regular aerobic exercise Recommend home blood pressure monitoring to discuss at next visit Reviewed risks of hypertension, principles of treatment and consequences of untreated hypertension Continue current medications as above. Instructed patient to bring home blood pressure cuff to next visit to validate readings.  Check renal function per nephrology   Hyperlipidemia: Current medications: rosuvastatin 40 mg by mouth once daily Intolerances: none Taking medications as directed: yes Side effects thought to be attributed to current medication regimen: no Uncontrolled; LDL above goal of <70 due to very high risk given diabetes + at least 1 additional major risk factor (elevated LDL cholesterol, hypertension, chronic kidney disease (CKD), and cigarette smoking) per 2020 AACE/ACE guidelines and TG at goal of <150 per 2020 AACE/ACE guidelines Continue rosuvastatin 40 mg by mouth daily given extremely elevated ASCVD risk Recommend regular aerobic exercise Reviewed risks of hyperlipidemia, principles of treatment and consequences of untreated hyperlipidemia Discussed need for medication compliance Patient reports that she has stopped smoking with the use  of Chantix. She plans to finish out current box of Chantix and then discontinue. Will let me know if she gets cravings to smoke and needs to restart therapy.  Re-check lipid panel at next PCP visit. 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 Theador Hawthorne Current medications: losartan 25 mg by mouth once daily and empagliflozin (Jardiance) 10 mg by mouth once daily Unsure if patient actually taking Jardiance  Most recent GFR: 32 mL/min Most recent microalbumin: unknown Continue statin Continue ARB and SGLT2 inhibitor Check BMP with nephrology Discussed need for medication compliance Discussed need for improved control of blood pressure Discussed need for improved control of cholesterol  Patient Goals/Self-Care Activities Over the next 90 days, patient will:  Focus on medication adherence by keeping up with prescription refills and either using a pill box or reminders to take your medications at the prescribed times Check blood pressure at least once daily, document, and provide at future appointments  Follow Up Plan: Face to Face appointment with care management team member scheduled for: 10/11/21      Medication Assistance: None required.  Patient affirms current coverage meets needs.  Patient's preferred pharmacy is:  [facility] [place], [address] [place] [address] Phone: [phone] Fax: [phone]  Follow Up:  Patient agrees to Care Plan and Follow-up.  Plan: Face to Face appointment with care management team member scheduled for: 10/11/21  Kennon Holter, PharmD Clinical Pharmacist [facility] [phone]

## 2021-08-31 NOTE — Patient Instructions (Signed)
Burnard Bunting,  It was great to talk to you today!  Please call me with any questions or concerns.   Visit Information  PATIENT GOALS:  Goals Addressed             This Visit's Progress    Medication Management       Patient Goals/Self-Care Activities Over the next 90 days, patient will:  Focus on medication adherence by keeping up with prescription refills and either using a pill box or reminders to take your medications at the prescribed times Check blood pressure at least once daily, document, and provide at future appointments          COMPLETED: Quit Smoking       Pt would like to quit smoking         The patient verbalized understanding of instructions, educational materials, and care plan provided today and declined offer to receive copy of patient instructions, educational materials, and care plan.   Face to Face appointment with care management team member scheduled for: 10/11/21  Kennon Holter, PharmD Clinical Pharmacist [facility] [phone]

## 2021-09-18 DIAGNOSIS — N1832 Chronic kidney disease, stage 3b: Secondary | ICD-10-CM | POA: Diagnosis not present

## 2021-09-18 DIAGNOSIS — I10 Essential (primary) hypertension: Secondary | ICD-10-CM | POA: Diagnosis not present

## 2021-09-18 DIAGNOSIS — E1122 Type 2 diabetes mellitus with diabetic chronic kidney disease: Secondary | ICD-10-CM

## 2021-09-18 DIAGNOSIS — N184 Chronic kidney disease, stage 4 (severe): Secondary | ICD-10-CM

## 2021-09-18 DIAGNOSIS — E785 Hyperlipidemia, unspecified: Secondary | ICD-10-CM

## 2021-10-09 DIAGNOSIS — E211 Secondary hyperparathyroidism, not elsewhere classified: Secondary | ICD-10-CM | POA: Diagnosis not present

## 2021-10-09 DIAGNOSIS — N189 Chronic kidney disease, unspecified: Secondary | ICD-10-CM | POA: Diagnosis not present

## 2021-10-09 DIAGNOSIS — I5032 Chronic diastolic (congestive) heart failure: Secondary | ICD-10-CM | POA: Diagnosis not present

## 2021-10-09 DIAGNOSIS — E559 Vitamin D deficiency, unspecified: Secondary | ICD-10-CM | POA: Diagnosis not present

## 2021-10-09 DIAGNOSIS — E1122 Type 2 diabetes mellitus with diabetic chronic kidney disease: Secondary | ICD-10-CM | POA: Diagnosis not present

## 2021-10-11 ENCOUNTER — Other Ambulatory Visit: Payer: Self-pay

## 2021-10-11 ENCOUNTER — Ambulatory Visit (INDEPENDENT_AMBULATORY_CARE_PROVIDER_SITE_OTHER): Payer: Medicare HMO | Admitting: Pharmacist

## 2021-10-11 VITALS — BP 157/72 | HR 66

## 2021-10-11 DIAGNOSIS — N1832 Chronic kidney disease, stage 3b: Secondary | ICD-10-CM

## 2021-10-11 DIAGNOSIS — E785 Hyperlipidemia, unspecified: Secondary | ICD-10-CM

## 2021-10-11 DIAGNOSIS — E1122 Type 2 diabetes mellitus with diabetic chronic kidney disease: Secondary | ICD-10-CM

## 2021-10-11 DIAGNOSIS — I10 Essential (primary) hypertension: Secondary | ICD-10-CM

## 2021-10-11 NOTE — Chronic Care Management (AMB) (Signed)
Chronic Care Management Pharmacy Note  10/11/2021 Name:  Deborah Gomez MRN:  [id number] DOB:  Feb 09, 1946  Summary:  Hypertension: Current medications: losartan 25 mg by mouth once daily, nifedipine 60 mg by mouth once daily, carvedilol 3.125 mg by mouth twice daily, and hydralazine 25 mg by mouth three times daily Blood pressure control is unclear. High today, but home blood pressure 120-140s/60-70s per log. Patient brought home blood pressure cuff today which reported blood pressure of 158/70 in office which is almost identical to validated blood pressure machine in office. Would consider patient's blood pressure cuff to be accurate and validated. Home blood pressure is at goal of <130/80 mmHg per 2017 AHA/ACC guidelines about 50% of the time. More blood pressure logs given today in clinic. Will have patient continue current regimen. Follow-up in 4 weeks to check blood pressure logs again. If blood pressure remains elevated then may consider increasing hydralazine to 50 mg by mouth three time daily.  Recommended that the patient focus on medication adherence  Hyperlipidemia: Continue rosuvastatin 40 mg by mouth daily given extremely elevated ASCVD risk Patient reports that she has stopped smoking with the use of Chantix x1 month. Denies cravings or relapse.  Re-check lipid panel at next PCP visit. 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 Theador Hawthorne. Next appointment next week. Check BMP with nephrology Continue losartan 25 mg by mouth once daily and empagliflozin (Jardiance) 10 mg by mouth once daily Continue statin  Subjective: Deborah Gomez is an 75 y.o. year old female who is a primary patient of Noreene Larsson, NP.  The CCM team was consulted for assistance with disease management and care coordination needs.    Engaged with patient face to face for  follow up visit in response to provider referral for pharmacy case management and/or care coordination services.   Consent to Services:  The patient was given information about Chronic Care Management services, agreed to services, and gave verbal consent prior to initiation of services.  Please see initial visit note for detailed documentation.   Patient Care Team: Noreene Larsson, NP as PCP - General (Nurse Practitioner) Beryle Lathe, [facility] (Pharmacist)  Objective:  Lab Results  Component Value Date   CREATININE 1.67 (H) 07/12/2021   CREATININE 1.65 (H) 05/15/2021    Lab Results  Component Value Date   HGBA1C 6.4 (H) 05/15/2021   Last diabetic Eye exam: No results found for: HMDIABEYEEXA  Last diabetic Foot exam: No results found for: HMDIABFOOTEX      Component Value Date/Time   CHOL 254 (H) 07/12/2021 1145   TRIG 122 07/12/2021 1145   HDL 79 07/12/2021 1145   LDLCALC 154 (H) 07/12/2021 1145    Hepatic Function Latest Ref Rng & Units 07/12/2021 05/15/2021  Total Protein 6.0 - 8.5 g/dL 6.5 6.7  Albumin 3.7 - 4.7 g/dL 4.0 3.5  AST 0 - 40 IU/L 19 20  ALT 0 - 32 IU/L 15 17  Alk Phosphatase 44 - 121 IU/L 108 85  Total Bilirubin 0.0 - 1.2 mg/dL <0.2 0.1(L)    Lab Results  Component Value Date/Time   TSH 0.984 07/12/2021 11:45 AM   TSH 1.895 05/16/2021 02:16 AM   FREET4 1.33 07/12/2021 11:45 AM    CBC Latest Ref Rng & Units 07/12/2021 05/15/2021  WBC 3.4 - 10.8 x10E3/uL 7.4 6.0  Hemoglobin 11.1 -  15.9 g/dL 11.7 13.0  Hematocrit 34.0 - 46.6 % 36.7 44.0  Platelets 150 - 450 x10E3/uL 287 215    Lab Results  Component Value Date/Time   VD25OH 14.7 (L) 07/12/2021 11:45 AM    Clinical ASCVD: No  The 10-year ASCVD risk score (Arnett DK, et al., 2019) is: 79.2%   Values used to calculate the score:     Age: 57 years     Sex: Female     Is Non-Hispanic African American: Yes     Diabetic: Yes     Tobacco smoker: Yes     Systolic Blood Pressure: 761 mmHg     Is  BP treated: Yes     HDL Cholesterol: 79 mg/dL     Total Cholesterol: 254 mg/dL    Social History   Tobacco Use  Smoking Status Former   Packs/day: 0.50   Years: 52.00   Pack years: 26.00   Types: Cigarettes   Quit date: 08/21/2021   Years since quitting: 0.1  Smokeless Tobacco Never   BP Readings from Last 3 Encounters:  10/11/21 (!) 157/72  08/31/21 (!) 156/76  08/16/21 (!) 162/82   Pulse Readings from Last 3 Encounters:  10/11/21 66  08/31/21 68  08/16/21 77   Wt Readings from Last 3 Encounters:  08/16/21 163 lb 0.6 oz (74 kg)  07/12/21 165 lb (74.8 kg)  06/06/21 167 lb (75.8 kg)    Assessment: Review of patient past medical history, allergies, medications, health status, including review of consultants reports, laboratory and other test data, was performed as part of comprehensive evaluation and provision of chronic care management services.   SDOH:  (Social Determinants of Health) assessments and interventions performed:    CCM Care Plan  No Known Allergies  Medications Reviewed Today     Reviewed by Beryle Lathe, [facility] (Pharmacist) on 10/11/21 at 1352  Med List Status: <None>   Medication Order Taking? Sig Documenting Provider Last Dose Status Informant  aspirin EC 81 MG tablet [id number] Yes Take 81 mg by mouth daily. Swallow whole. [provider] Taking Active   carvedilol (COREG) 3.125 MG tablet [id number] Yes Take 1 tablet (3.125 mg total) by mouth 2 (two) times daily with a meal. Noreene Larsson, NP Taking Active   empagliflozin (JARDIANCE) 10 MG TABS tablet [id number] Yes Take 1 tablet (10 mg total) by mouth daily. Noreene Larsson, NP Taking Active   gabapentin (NEURONTIN) 100 MG capsule [id number] Yes Take 1 capsule (100 mg total) by mouth 3 (three) times daily. Noreene Larsson, NP Taking Active   hydrALAZINE (APRESOLINE) 25 MG tablet [id number] Yes Take 25 mg by mouth 3 (three) times daily. [provider] Taking Active Self  losartan  (COZAAR) 25 MG tablet [id number] Yes Take 1 tablet (25 mg total) by mouth daily. Noreene Larsson, NP Taking Active   meclizine (ANTIVERT) 25 MG tablet [id number] No Take 25 mg by mouth 2 (two) times daily as needed for dizziness.  Patient not taking: Reported on 08/31/2021   [provider] Not Taking Active   NIFEdipine (PROCARDIA XL/NIFEDICAL XL) 60 MG 24 hr tablet [id number] Yes Take 60 mg by mouth daily. [provider] Taking Active Self  psyllium (REGULOID) 0.52 g capsule [id number] Yes Take by mouth. [provider] Taking Active   rosuvastatin (CRESTOR) 40 MG tablet [id number] Yes Take 1 tablet (40 mg total) by mouth daily. Noreene Larsson, NP Taking Active   traMADol Veatrice Bourbon) 50 MG tablet [id number]  No Take 50 mg by mouth 2 (two) times daily as needed.  Patient not taking: Reported on 08/31/2021   [provider] Not Taking Active   Vitamin D, Ergocalciferol, (DRISDOL) 1.25 MG (50000 UNIT) CAPS capsule [id number] Yes Take 1 capsule (50,000 Units total) by mouth every 7 (seven) days. 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  Conditions to be addressed/monitored: HTN, HLD, DMII, and CKD Stage 3b  Care Plan : Medication Management  Updates made by Beryle Lathe, [place] since 10/11/2021 12:00 AM     Problem: HTN, T2DM, HLD, CKD   Priority: High  Onset Date: 07/20/2021     Long-Range Goal: Disease Progression  Prevention   Start Date: 07/20/2021  Expected End Date: 10/18/2021  Recent Progress: On track  Priority: High  Note:   Current Barriers:  Unable to achieve control of hypertension  and hyperlipidemia  Pharmacist Clinical Goal(s):  Over the next 90 days, patient will Achieve control of hypertension  and hyperlipidemia as evidenced by improved blood pressure control and improved LDL through collaboration with PharmD and provider.   Interventions: 1:1 collaboration with Noreene Larsson, NP regarding development and update of comprehensive plan of care as evidenced by provider attestation and co-signature Inter-disciplinary care team collaboration (see longitudinal plan of care) Comprehensive medication review performed; medication list updated in electronic medical record  Type 2 Diabetes: Current medications: empagliflozin (Jardiance) 10 mg by mouth daily No metformin due to renal function Intolerances: none Taking medications as directed: yes Side effects thought to be attributed to current medication regimen: no Denies hypoglycemic/hyperglycemic symptoms Hypoglycemia prevention: not indicated at this time Current meal patterns: not discussed today Current exercise:  not discussed today On a statin: _0  Yes  _1  No    Last microalbumin: unknown; on an ACEi/ARB: _2  Yes  _3  No    Current glucose readings: will discuss at future visits Controlled; Most recent A1c at goal of <7% per ADA guidelines Patient reports taking Jardiance but it is not showing in dispense history. Unsure if patient actually taking. Called pharmacy and co-pay for Vania Rea will be $45 per month of $135 per 3 months. Given patient's reported financial situation. Will recommend that the patient apply for Medicare low income subsidy. If denied, then can apply for manufacturer assistance.  Hypertension: Current medications: losartan 25 mg by mouth once daily, nifedipine 60 mg by mouth once daily, carvedilol 3.125 mg by  mouth twice daily, and hydralazine 25 mg by mouth three times daily Intolerances: unknown Taking medications as directed: yes Side effects thought to be attributed to current medication regimen: no Blood pressure control is unclear. High today, but home blood pressure 120-140s/60-70s per log. Patient brought home blood pressure cuff today which reported blood pressure of 158/70 in office which is almost identical to validated blood pressure machine in  office. Would consider patient's blood pressure cuff to be accurate and validated. Home blood pressure is at goal of <130/80 mmHg per 2017 AHA/ACC guidelines about 50% of the time. More blood pressure logs given today in clinic. Will have patient continue current regimen. Follow-up in 4 weeks to check blood pressure logs again. If blood pressure remains elevated then may consider increasing hydralazine to 50 mg by mouth three time daily.  Working up for secondary causes of hypertension (Renal US 07/19/21) Encourage dietary sodium restriction/DASH diet Recommend regular aerobic exercise Recommend home blood pressure monitoring to discuss at next visit Reviewed risks of hypertension, principles of treatment and consequences of untreated hypertension Check renal function per nephrology. Patient reports she will see Dr. Theador Hawthorne next week.  Recommended that the patient focus on medication adherence.  Hyperlipidemia: Current medications: rosuvastatin 40 mg by mouth once daily Intolerances: none Taking medications as directed: yes Side effects thought to be attributed to current medication regimen: no Uncontrolled; LDL above goal of <70 due to very high risk given diabetes + at least 1 additional major risk factor (elevated LDL cholesterol, hypertension, chronic kidney disease (CKD), and cigarette smoking) per 2020 AACE/ACE guidelines and TG at goal of <150 per 2020 AACE/ACE guidelines Continue rosuvastatin 40 mg by mouth daily given extremely elevated  ASCVD risk Recommend regular aerobic exercise Reviewed risks of hyperlipidemia, principles of treatment and consequences of untreated hyperlipidemia Discussed need for medication compliance Patient reports that she has stopped smoking with the use of Chantix x1 month. Denies cravings or relapse.  Re-check lipid panel at next PCP visit. 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 Theador Hawthorne Current medications: losartan 25 mg by mouth once daily and empagliflozin (Jardiance) 10 mg by mouth once daily Unsure if patient actually taking Jardiance  Most recent GFR: 32 mL/min Most recent microalbumin: unknown Continue statin Continue ARB and SGLT2 inhibitor Check BMP with nephrology Discussed need for medication compliance Discussed need for improved control of blood pressure Discussed need for improved control of cholesterol  Patient Goals/Self-Care Activities Over the next 90 days, patient will:  Focus on medication adherence by keeping up with prescription refills and either using a pill box or reminders to take your medications at the prescribed times Check blood pressure at least once daily, document, and provide at future appointments  Follow Up Plan: Face to Face appointment with care management team member scheduled for: 11/14/21      Medication Assistance: None required.  Patient affirms current coverage meets needs.  Patient's preferred pharmacy is:  [facility], [place] [address] Phone: [phone] Fax: [phone]  Follow Up:  Patient agrees to Care Plan and Follow-up.  Plan: Face to Face appointment with care management team member scheduled for: 11/14/21  Kennon Holter, PharmD, [place] Pharmacist [facility] [phone]

## 2021-10-11 NOTE — Patient Instructions (Signed)
Deborah Gomez,  It was great to talk to you today! Please get some vitamin D 1,000 or 2,00 units and take 1 capsule by mouth daily. We can check your vitamin D elvel at your next PCP visit to make sure it has increased to the normal range. You can also buy some meclizine over the counter to take as needed for dizziness.   Please call me with any questions or concerns.   Visit Information  Following are the goals we discussed today:  Patient Goals/Self-Care Activities Over the next 90 days, patient will:  Focus on medication adherence by keeping up with prescription refills and either using a pill box or reminders to take your medications at the prescribed times Check blood pressure at least once daily, document, and provide at future appointments  Plan: Face to Face appointment with care management team member scheduled for: 11/14/21  Kennon Holter, PharmD, [facility] Clinical Pharmacist [place] [facility] [phone]   Please call the care guide team at [phone] if you need to cancel or reschedule your appointment.   Print copy of patient instructions, educational materials, and care plan provided in person.

## 2021-10-18 DIAGNOSIS — E785 Hyperlipidemia, unspecified: Secondary | ICD-10-CM

## 2021-10-18 DIAGNOSIS — I129 Hypertensive chronic kidney disease with stage 1 through stage 4 chronic kidney disease, or unspecified chronic kidney disease: Secondary | ICD-10-CM

## 2021-10-18 DIAGNOSIS — E1122 Type 2 diabetes mellitus with diabetic chronic kidney disease: Secondary | ICD-10-CM

## 2021-10-18 DIAGNOSIS — N1832 Chronic kidney disease, stage 3b: Secondary | ICD-10-CM | POA: Diagnosis not present

## 2021-10-18 DIAGNOSIS — Z7984 Long term (current) use of oral hypoglycemic drugs: Secondary | ICD-10-CM

## 2021-10-19 DIAGNOSIS — E559 Vitamin D deficiency, unspecified: Secondary | ICD-10-CM | POA: Diagnosis not present

## 2021-10-19 DIAGNOSIS — I5032 Chronic diastolic (congestive) heart failure: Secondary | ICD-10-CM | POA: Diagnosis not present

## 2021-10-19 DIAGNOSIS — N170 Acute kidney failure with tubular necrosis: Secondary | ICD-10-CM | POA: Diagnosis not present

## 2021-10-19 DIAGNOSIS — R808 Other proteinuria: Secondary | ICD-10-CM | POA: Diagnosis not present

## 2021-10-19 DIAGNOSIS — E211 Secondary hyperparathyroidism, not elsewhere classified: Secondary | ICD-10-CM | POA: Diagnosis not present

## 2021-10-19 DIAGNOSIS — N189 Chronic kidney disease, unspecified: Secondary | ICD-10-CM | POA: Diagnosis not present

## 2021-10-19 DIAGNOSIS — I160 Hypertensive urgency: Secondary | ICD-10-CM | POA: Diagnosis not present

## 2021-10-19 DIAGNOSIS — I350 Nonrheumatic aortic (valve) stenosis: Secondary | ICD-10-CM | POA: Diagnosis not present

## 2021-10-19 DIAGNOSIS — E1122 Type 2 diabetes mellitus with diabetic chronic kidney disease: Secondary | ICD-10-CM | POA: Diagnosis not present

## 2021-11-01 ENCOUNTER — Other Ambulatory Visit: Payer: Self-pay

## 2021-11-01 DIAGNOSIS — I10 Essential (primary) hypertension: Secondary | ICD-10-CM

## 2021-11-01 DIAGNOSIS — E785 Hyperlipidemia, unspecified: Secondary | ICD-10-CM

## 2021-11-01 MED ORDER — CARVEDILOL 3.125 MG PO TABS: 3.1250 mg | ORAL_TABLET | Freq: Two times a day (BID) | ORAL | 2 refills | Status: DC

## 2021-11-01 MED ORDER — ROSUVASTATIN CALCIUM 40 MG PO TABS: 40.0000 mg | ORAL_TABLET | Freq: Every day | ORAL | 3 refills | Status: DC

## 2021-11-01 MED ORDER — NIFEDIPINE ER OSMOTIC RELEASE 60 MG PO TB24: 60.0000 mg | ORAL_TABLET | Freq: Every day | ORAL | 1 refills | Status: DC

## 2021-11-01 MED ORDER — LOSARTAN POTASSIUM 25 MG PO TABS: 25.0000 mg | ORAL_TABLET | Freq: Every day | ORAL | 3 refills | Status: DC

## 2021-11-02 ENCOUNTER — Other Ambulatory Visit: Payer: Self-pay

## 2021-11-02 ENCOUNTER — Telehealth: Payer: Self-pay | Admitting: Nurse Practitioner

## 2021-11-02 DIAGNOSIS — I10 Essential (primary) hypertension: Secondary | ICD-10-CM

## 2021-11-02 DIAGNOSIS — E785 Hyperlipidemia, unspecified: Secondary | ICD-10-CM

## 2021-11-02 DIAGNOSIS — E1122 Type 2 diabetes mellitus with diabetic chronic kidney disease: Secondary | ICD-10-CM

## 2021-11-02 MED ORDER — EMPAGLIFLOZIN 10 MG PO TABS: 10.0000 mg | ORAL_TABLET | Freq: Every day | ORAL | 3 refills | Status: DC

## 2021-11-02 MED ORDER — ROSUVASTATIN CALCIUM 40 MG PO TABS: 40.0000 mg | ORAL_TABLET | Freq: Every day | ORAL | 3 refills | Status: DC

## 2021-11-02 MED ORDER — LOSARTAN POTASSIUM 25 MG PO TABS
25.0000 mg | ORAL_TABLET | Freq: Every day | ORAL | 3 refills | Status: DC
Start: 2021-11-02 — End: 2021-11-14

## 2021-11-02 MED ORDER — CARVEDILOL 3.125 MG PO TABS: 3.1250 mg | ORAL_TABLET | Freq: Two times a day (BID) | ORAL | 2 refills | Status: DC

## 2021-11-02 NOTE — Telephone Encounter (Signed)
Pt needs refills on   empagliflozin (JARDIANCE) 10 MG TABS tablet  losartan (COZAAR) 25 MG tablet  rosuvastatin (CRESTOR) 40 MG tablet   carvedilol (COREG) 3.125 MG tablet   Pt wants ALL prescriptions & refills to go to [facility] [place]

## 2021-11-02 NOTE — Telephone Encounter (Signed)
Refills sent

## 2021-11-14 ENCOUNTER — Encounter: Payer: Self-pay | Admitting: Nurse Practitioner

## 2021-11-14 ENCOUNTER — Other Ambulatory Visit: Payer: Self-pay

## 2021-11-14 ENCOUNTER — Ambulatory Visit (INDEPENDENT_AMBULATORY_CARE_PROVIDER_SITE_OTHER): Payer: Medicare HMO | Admitting: Pharmacist

## 2021-11-14 ENCOUNTER — Ambulatory Visit (INDEPENDENT_AMBULATORY_CARE_PROVIDER_SITE_OTHER): Payer: Medicare HMO | Admitting: Nurse Practitioner

## 2021-11-14 VITALS — BP 166/55 | HR 73

## 2021-11-14 VITALS — BP 167/72 | HR 77 | Ht 64.0 in | Wt 166.0 lb

## 2021-11-14 DIAGNOSIS — E611 Iron deficiency: Secondary | ICD-10-CM

## 2021-11-14 DIAGNOSIS — E785 Hyperlipidemia, unspecified: Secondary | ICD-10-CM | POA: Diagnosis not present

## 2021-11-14 DIAGNOSIS — I10 Essential (primary) hypertension: Secondary | ICD-10-CM

## 2021-11-14 DIAGNOSIS — E1122 Type 2 diabetes mellitus with diabetic chronic kidney disease: Secondary | ICD-10-CM

## 2021-11-14 DIAGNOSIS — N184 Chronic kidney disease, stage 4 (severe): Secondary | ICD-10-CM | POA: Diagnosis not present

## 2021-11-14 DIAGNOSIS — N1832 Chronic kidney disease, stage 3b: Secondary | ICD-10-CM

## 2021-11-14 MED ORDER — EMPAGLIFLOZIN 10 MG PO TABS: 10.0000 mg | ORAL_TABLET | Freq: Every day | ORAL | 3 refills | Status: DC

## 2021-11-14 NOTE — Patient Instructions (Addendum)
Deborah Gomez,  It was great to talk to you today!  Please have [facility] in [place] transfer all of your prescriptions from [facility] order pharmacy so there is no more confusion regarding where your medications are coming from.   I called [facility] in [place] and they said they would get your Jardiance ready for you today. Your co-pay is $45 per month. As we discussed before you may qualify for Medicare Extra Help.  Please contact [employer] and have them help you apply for Medicare Extra Help (also known as low income subsidy).  [facility]     [address] [place]  [address] [phone]   Please call me with any questions or concerns.   Visit Information  Following are the goals we discussed today:  Patient Goals/Self-Care Activities Over the next 90 days, patient will:  Focus on medication adherence by keeping up with prescription refills and either using a pill box or reminders to take your medications at the prescribed times Check blood pressure at least once daily, document, and provide at future appointments  Plan: Telephone follow up appointment with care management team member scheduled for:  12/13/21  Kennon Holter, PharmD, BCACP, CPP Clinical Pharmacist Practitioner [facility] [phone]  Please call the care guide team at [phone] if you need to cancel or reschedule your appointment.   Print copy of patient instructions, educational materials, and care plan provided in person.

## 2021-11-14 NOTE — Assessment & Plan Note (Signed)
-  followed by nephrology -she states she has been taking losartan as prescribed

## 2021-11-14 NOTE — Assessment & Plan Note (Signed)
-  check lipid panel  

## 2021-11-14 NOTE — Progress Notes (Signed)
Acute Office Visit  Subjective:    Patient ID: Deborah Gomez, female    DOB: 02-Feb-1946, 75 y.o.   MRN: [id number]  Chief Complaint  Patient presents with   Follow-up    Follow up     HPI Patient is in today for follow-up for T2DM, CKD, HTN, and HLD. At her last OV, we swapped her from pravastatin to rosuvastatin, stopped an iron supplement, restarted her on losartan, and started Jardiance.  She states that she didn't get the Jardiance.  She didn't have labs drawn prior to this appointment.  Past Medical History:  Diagnosis Date   Chronic kidney disease    Diabetes mellitus without complication ([place])    Hypertension     Past Surgical History:  Procedure Laterality Date   APPENDECTOMY     BREAST SURGERY Left    CARPAL TUNNEL RELEASE Right    REPLACEMENT TOTAL KNEE     THYROID SURGERY      History reviewed. No pertinent family history.  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  Tobacco Use   Smoking status: Former    Packs/day: 0.50    Years: 52.00    Pack years: 26.00    Types: Cigarettes    Quit date: 08/21/2021    Years since quitting: 0.2   Smokeless tobacco: Never  Vaping Use   Vaping Use: Never used  Substance and Sexual Activity   Alcohol use: Not Currently   Drug use: Never   Sexual activity: Yes    Birth control/protection: Post-menopausal  Other Topics Concern   Not on file  Social History Narrative   1 child in [place], the other is not nearby   Social Determinants of Radio broadcast assistant Strain: Low Risk    Difficulty of Paying Living Expenses: Not hard at all  Food Insecurity: No Food Insecurity   Worried About Charity fundraiser in the Last Year: Never true   Academic librarian in the Last Year: Never true  Transportation Needs: No Transportation Needs   Lack of Transportation (Medical): No   Lack of Transportation (Non-Medical): No  Physical Activity: Insufficiently Active   Days of Exercise per Week: 3 days   Minutes of Exercise per Session: 40 min  Stress: No Stress Concern Present   Feeling of Stress : Not at all  Social Connections: Moderately Isolated   Frequency of Communication with Friends and Family: More than three times a week   Frequency of Social Gatherings with Friends and Family: More than three times a week   Attends Religious Services: 1 to 4 times per year   Active Member of [employer] or Organizations: No   Attends Archivist Meetings: Never   Marital Status: Widowed  Human resources officer Violence: Not At Risk   Fear of Current or Ex-Partner: No   Emotionally Abused: No   Physically Abused: No   Sexually Abused: No    Outpatient Medications Prior to Visit  Medication Sig Dispense Refill   aspirin EC 81 MG tablet Take 81 mg by mouth daily. Swallow whole.     carvedilol (COREG) 3.125 MG tablet Take 1 tablet (3.125 mg total) by mouth 2 (  two) times daily with a meal. 60 tablet 2   gabapentin (NEURONTIN) 100 MG capsule Take 1 capsule (100 mg total) by mouth 3 (three) times daily. 90 capsule 2   hydrALAZINE (APRESOLINE) 50 MG tablet Take 1 tablet by mouth 3 (three) times daily.     losartan (COZAAR) 50 MG tablet Take 50 mg by mouth daily.     NIFEdipine (PROCARDIA XL/NIFEDICAL-XL) 90 MG 24 hr tablet Take 90 mg by mouth daily.     rosuvastatin (CRESTOR) 40 MG tablet Take 1 tablet (40 mg total) by mouth daily. 90 tablet 3   Vitamin D, Ergocalciferol, (DRISDOL) 1.25 MG (50000 UNIT) CAPS capsule Take 1 capsule (50,000 Units total) by mouth every 7 (seven) days. 12 capsule 0   meclizine (ANTIVERT) 25 MG tablet Take 25 mg by mouth 2 (two) times daily as needed for dizziness. (Patient not taking: Reported on 11/14/2021)     traMADol (ULTRAM)  50 MG tablet Take 50 mg by mouth 2 (two) times daily as needed. (Patient not taking: Reported on 11/14/2021)     empagliflozin (JARDIANCE) 10 MG TABS tablet Take 1 tablet (10 mg total) by mouth daily. (Patient not taking: Reported on 11/14/2021) 90 tablet 3   No facility-administered medications prior to visit.    No Known Allergies  Review of Systems  Constitutional: Negative.   Respiratory: Negative.    Cardiovascular: Negative.   Endocrine: Negative.   Musculoskeletal: Negative.   Psychiatric/Behavioral: Negative.        Objective:    Physical Exam Constitutional:      Appearance: Normal appearance.  Cardiovascular:     Rate and Rhythm: Normal rate and regular rhythm.     Pulses: Normal pulses.     Heart sounds: Normal heart sounds.  Pulmonary:     Effort: Pulmonary effort is normal.     Breath sounds: Normal breath sounds.  Musculoskeletal:        General: Normal range of motion.  Neurological:     Mental Status: She is alert.  Psychiatric:        Mood and Affect: Mood normal.        Behavior: Behavior normal.        Thought Content: Thought content normal.        Judgment: Judgment normal.    BP (!) 167/72    Pulse 77    Ht _0  (1.626 m)    Wt 166 lb 0.6 oz (75.3 kg)    SpO2 95%    BMI 28.50 kg/m  Wt Readings from Last 3 Encounters:  11/14/21 166 lb 0.6 oz (75.3 kg)  08/16/21 163 lb 0.6 oz (74 kg)  07/12/21 165 lb (74.8 kg)    Health Maintenance Due  Topic Date Due   Pneumonia Vaccine 43+ Years old (1 - PCV) Never done   FOOT EXAM  Never done   OPHTHALMOLOGY EXAM  Never done   Hepatitis C Screening  Never done   COLONOSCOPY (Pts 45-33yr Insurance coverage will need to be confirmed)  Never done   Zoster Vaccines- Shingrix (1 of 2) Never done   HEMOGLOBIN A1C  11/14/2021    There are no preventive care reminders to display for this patient.   Lab Results  Component Value Date   TSH 0.984 07/12/2021   Lab Results  Component Value Date   WBC 7.4  07/12/2021   HGB 11.7 07/12/2021   HCT 36.7 07/12/2021   MCV 86 07/12/2021   PLT 287 07/12/2021  Lab Results  Component Value Date   NA 142 07/12/2021   K 5.1 07/12/2021   CO2 24 07/12/2021   GLUCOSE 95 07/12/2021   BUN 20 07/12/2021   CREATININE 1.67 (H) 07/12/2021   BILITOT <0.2 07/12/2021   ALKPHOS 108 07/12/2021   AST 19 07/12/2021   ALT 15 07/12/2021   PROT 6.5 07/12/2021   ALBUMIN 4.0 07/12/2021   CALCIUM 9.5 07/12/2021   ANIONGAP 6 05/15/2021   EGFR 32 (L) 07/12/2021   Lab Results  Component Value Date   CHOL 254 (H) 07/12/2021   Lab Results  Component Value Date   HDL 79 07/12/2021   Lab Results  Component Value Date   LDLCALC 154 (H) 07/12/2021   Lab Results  Component Value Date   TRIG 122 07/12/2021   No results found for: CHOLHDL Lab Results  Component Value Date   HGBA1C 6.4 (H) 05/15/2021       Assessment & Plan:   Problem List Items Addressed This Visit       Cardiovascular and Mediastinum   HTN (hypertension), malignant    BP Readings from Last 3 Encounters:  11/14/21 (!) 167/72  11/14/21 (!) 166/55  10/11/21 (!) 157/72  -she states her home BP was 130/80s this AM, but gets elevated when she gets up and has to run to the Dr. 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    Dispense:  90 tablet    Refill:  Nelson, NP

## 2021-11-14 NOTE — Assessment & Plan Note (Signed)
-  we stopped iron panel at last OV, recheck iron panel

## 2021-11-14 NOTE — Chronic Care Management (AMB) (Signed)
Chronic Care Management Pharmacy Note  11/14/2021 Name:  Deborah Gomez MRN:  [id number] DOB:  10/09/1946  Summary: Type 2 Diabetes Patient reports [facility] did not have Jardiance ready for her but she is not sure of the reason. A1c checked today [facility] in [place] and they were able to fill the Jardiance for the patient today. Co-pay for Vania Rea will be $45 per month or $135 per 3 months. Given patient's reported financial situation. Will recommend that the patient apply for Medicare low income subsidy. If denied, then can apply for manufacturer assistance.  Hypertension Blood pressure control is fair. High today in office, but home blood pressure 130-140s/60-70s per verbal report. Patient did not bring blood pressure log today. Home blood pressure cuff previously validated. Home blood pressure is at goal of <130/80 mmHg per 2017 AHA/ACC guidelines about 50% of the time. Current medications: losartan 50 mg by mouth once daily, nifedipine 90 mg by mouth once daily, carvedilol 3.125 mg by mouth twice daily, and hydralazine 50 mg by mouth three times daily Dr. Theador Hawthorne recently increased dose of nifedipine, hydralazine, and losartan per patient.  Continue continue current regimen per nephrology. Patient to see nephrology again in 2-3 weeks.  Check renal function per nephrology  Hyperlipidemia Uncontrolled; LDL above goal of <70 due to very high risk given diabetes + at least 1 additional major risk factor (elevated LDL cholesterol, hypertension, and chronic kidney disease (CKD)) per 2020 AACE/ACE guidelines and TG at goal of <150 per 2020 AACE/ACE guidelines Continue rosuvastatin 40 mg by mouth daily given extremely elevated ASCVD risk Patient has stopped smoking after Chantix x1 month. Denies cravings or relapse.  Check lipid panel today. Consider adding ezetimibe 10 mg by mouth daily if LDL remains above goal  Chronic Kidney Disease Stage 3b - GFR 30-44 (Moderate  to Severely Reduced Function) Followed by Dr. Theador Hawthorne Current medications: losartan 50 mg by mouth once daily and empagliflozin (Jardiance) 10 mg by mouth once daily Patient reports not taking Jardiance currently.  Most recent GFR: 32 mL/min Continue statin Continue [facility] in [place] and had them fill her Jardiance today Check BMP with nephrology  Subjective: Deborah Gomez is an 75 y.o. year old female who is a primary patient of Noreene Larsson, NP.  The CCM team was consulted for assistance with disease management and care coordination needs.    Engaged with patient face to face for follow up visit in response to provider referral for pharmacy case management and/or care coordination services.   Consent to Services:  The patient was given information about Chronic Care Management services, agreed to services, and gave verbal consent prior to initiation of services.  Please see initial visit note for detailed documentation.   Patient Care Team: Noreene Larsson, NP as PCP - General (Nurse Practitioner) Beryle Lathe, [facility] (Pharmacist)  Objective:  Lab Results  Component Value Date   CREATININE 1.67 (H) 07/12/2021   CREATININE 1.65 (H) 05/15/2021    Lab Results  Component Value Date   HGBA1C 6.4 (H) 05/15/2021   Last diabetic Eye exam: No results found for: HMDIABEYEEXA  Last diabetic Foot exam: No results found for: HMDIABFOOTEX      Component Value Date/Time   CHOL 254 (H) 07/12/2021 1145   TRIG 122 07/12/2021 1145   HDL 79 07/12/2021 1145   LDLCALC 154 (H) 07/12/2021 1145    Hepatic Function Latest Ref Rng & Units 07/12/2021 05/15/2021  Total Protein 6.0 -  8.5 g/dL 6.5 6.7  Albumin 3.7 - 4.7 g/dL 4.0 3.5  AST 0 - 40 IU/L 19 20  ALT 0 - 32 IU/L 15 17  Alk Phosphatase 44 - 121 IU/L 108 85  Total Bilirubin 0.0 - 1.2 mg/dL <0.2 0.1(L)    Lab Results  Component Value Date/Time   TSH 0.984 07/12/2021 11:45 AM   TSH 1.895 05/16/2021 02:16 AM    FREET4 1.33 07/12/2021 11:45 AM    CBC Latest Ref Rng & Units 07/12/2021 05/15/2021  WBC 3.4 - 10.8 x10E3/uL 7.4 6.0  Hemoglobin 11.1 - 15.9 g/dL 11.7 13.0  Hematocrit 34.0 - 46.6 % 36.7 44.0  Platelets 150 - 450 x10E3/uL 287 215    Lab Results  Component Value Date/Time   VD25OH 14.7 (L) 07/12/2021 11:45 AM    Clinical ASCVD: No  The 10-year ASCVD risk score (Arnett DK, et al., 2019) is: 82.2%   Values used to calculate the score:     Age: 63 years     Sex: Female     Is Non-Hispanic African American: Yes     Diabetic: Yes     Tobacco smoker: Yes     Systolic Blood Pressure: 937 mmHg     Is BP treated: Yes     HDL Cholesterol: 79 mg/dL     Total Cholesterol: 254 mg/dL    Social History   Tobacco Use  Smoking Status Former   Packs/day: 0.50   Years: 52.00   Pack years: 26.00   Types: Cigarettes   Quit date: 08/21/2021   Years since quitting: 0.2  Smokeless Tobacco Never   BP Readings from Last 3 Encounters:  11/14/21 (!) 167/72  11/14/21 (!) 166/55  10/11/21 (!) 157/72   Pulse Readings from Last 3 Encounters:  11/14/21 77  11/14/21 73  10/11/21 66   Wt Readings from Last 3 Encounters:  11/14/21 166 lb 0.6 oz (75.3 kg)  08/16/21 163 lb 0.6 oz (74 kg)  07/12/21 165 lb (74.8 kg)    Assessment: Review of patient past medical history, allergies, medications, health status, including review of consultants reports, laboratory and other test data, was performed as part of comprehensive evaluation and provision of chronic care management services.   SDOH:  (Social Determinants of Health) assessments and interventions performed:    CCM Care Plan  No Known Allergies  Medications Reviewed Today     Reviewed by Noreene Larsson, NP (Nurse Practitioner) on 11/14/21 at 1340  Med List Status: <None>   Medication Order Taking? Sig Documenting Provider Last Dose Status Informant  aspirin EC 81 MG tablet [id number] Yes Take 81 mg by mouth daily. Swallow whole.  [provider] Taking Active   carvedilol (COREG) 3.125 MG tablet [id number] Yes Take 1 tablet (3.125 mg total) by mouth 2 (two) times daily with a meal. Noreene Larsson, NP Taking Active   empagliflozin (JARDIANCE) 10 MG TABS tablet [id number]  Take 1 tablet (10 mg total) by mouth daily. Noreene Larsson, NP  Active   gabapentin (NEURONTIN) 100 MG capsule [id number] Yes Take 1 capsule (100 mg total) by mouth 3 (three) times daily. Noreene Larsson, NP Taking Active   hydrALAZINE (APRESOLINE) 50 MG tablet [id number] Yes Take 1 tablet by mouth 3 (three) times daily. [provider] Taking Active   losartan (COZAAR) 50 MG tablet [id number] Yes Take 50 mg by mouth daily. [provider] Taking Active   meclizine (ANTIVERT) 25 MG tablet [id number] No Take 25 mg  by mouth 2 (two) times daily as needed for dizziness.  Patient not taking: Reported on 11/14/2021   [provider] Not Taking Active   NIFEdipine (PROCARDIA XL/NIFEDICAL-XL) 90 MG 24 hr tablet [id number] Yes Take 90 mg by mouth daily. [provider] Taking Consider Medication Status and Discontinue   rosuvastatin (CRESTOR) 40 MG tablet [id number] Yes Take 1 tablet (40 mg total) by mouth daily. Noreene Larsson, NP Taking Active   traMADol Veatrice Bourbon) 50 MG tablet [id number] No Take 50 mg by mouth 2 (two) times daily as needed.  Patient not taking: Reported on 11/14/2021   [provider] Not Taking Active   Vitamin D, Ergocalciferol, (DRISDOL) 1.25 MG (50000 UNIT) CAPS capsule [id number] Yes Take 1 capsule (50,000 Units total) by mouth every 7 (seven) days. 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  Conditions to be addressed/monitored: HTN, HLD, DMII, and CKD Stage 3b  Care Plan : Medication Management  Updates made by Beryle Lathe, [place] since 11/14/2021 12:00 AM     Problem: HTN, T2DM, HLD, CKD   Priority: High  Onset Date: 07/20/2021     Long-Range Goal: Disease Progression Prevention   Start Date: 07/20/2021  Expected End Date: 10/18/2021  Recent Progress: On track  Priority: High  Note:   Current Barriers:  Unable to achieve control of hypertension  and hyperlipidemia  Pharmacist Clinical Goal(s):  Over the next 90 days, patient will Achieve control of hypertension  and hyperlipidemia as evidenced by improved blood pressure control and improved LDL through collaboration with PharmD and provider.   Interventions: 1:1 collaboration with Noreene Larsson, NP regarding development and update of comprehensive plan of care as evidenced by provider attestation and co-signature Inter-disciplinary care team collaboration (see longitudinal plan of care) Comprehensive medication review performed; medication list updated in electronic medical record  Type 2 Diabetes  - Goal on Track (progressing): YES.: Controlled; Most recent A1c at goal of <7% per ADA guidelines Current medications: empagliflozin (Jardiance) 10 mg by mouth daily No metformin due to renal function Intolerances: none Taking medications as directed: no, patient reports [facility] did not have Jardiance ready for her but she is not sure of the reason. Side effects thought to be attributed to current medication regimen: no Denies hypoglycemic/hyperglycemic symptoms Hypoglycemia prevention: not indicated at this time Current meal patterns: not discussed today Current exercise:   not discussed today On a statin: _0  Yes  _1  No    Last microalbumin: unknown; on an ACEi/ARB: _2  Yes  _3  No    Current glucose readings: not discussed today A1c checked today [facility] in [place] and they were able to fill the [place] for the patient today. Co-pay for Vania Rea will be $45 per month or $135 per 3 months. Given patient's reported financial situation. Will recommend  that the patient apply for Medicare low income subsidy. If denied, then can apply for manufacturer assistance.  Hypertension - Goal on Track (progressing): YES.: Blood pressure control is fair. High today in office, but home blood pressure 130-140s/60-70s per verbal report. Patient did not bring blood pressure log today. Home blood pressure cuff previously validated. Home blood pressure is at goal of <130/80 mmHg per 2017 AHA/ACC guidelines about 50% of the time. Current medications: losartan 50 mg by mouth once daily, nifedipine 90 mg by mouth once daily, carvedilol 3.125 mg by mouth twice daily, and hydralazine 50 mg by mouth three times daily Dr. Theador Hawthorne recently increased dose of nifedipine, hydralazine, and losartan per patient.  Intolerances: unknown Taking medications as directed: yes Side effects thought to be attributed to current medication regimen: no Continue continue current regimen per nephrology. Patient to see nephrology again in 2-3 weeks.  Working up for secondary causes of hypertension (Renal US 07/19/21) Encourage dietary sodium restriction/DASH diet Recommend regular aerobic exercise Recommend home blood pressure monitoring to discuss at next visit Reviewed risks of hypertension, principles of treatment and consequences of untreated hypertension Check renal function per nephrology  Hyperlipidemia - Goal on Track (progressing): YES.: Uncontrolled; LDL above goal of <70 due to very high risk given diabetes + at least 1 additional major risk factor (elevated LDL cholesterol,  hypertension, and chronic kidney disease (CKD)) per 2020 AACE/ACE guidelines and TG at goal of <150 per 2020 AACE/ACE guidelines Current medications: rosuvastatin 40 mg by mouth once daily Intolerances: none Taking medications as directed: yes Side effects thought to be attributed to current medication regimen: no Recommend regular aerobic exercise Reviewed risks of hyperlipidemia, principles of treatment and consequences of untreated hyperlipidemia Discussed need for medication compliance Continue rosuvastatin 40 mg by mouth daily given extremely elevated ASCVD risk Patient has stopped smoking after Chantix x1 month. Denies cravings or relapse.  Check lipid panel today. Consider adding ezetimibe 10 mg by mouth daily if LDL remains above goal  Chronic Kidney Disease Stage 3b - GFR 30-44 (Moderate to Severely Reduced Function) - Goal on Track (progressing): YES.: Followed by Dr. Theador Hawthorne Current medications: losartan 50 mg by mouth once daily and empagliflozin (Jardiance) 10 mg by mouth once daily Patient reports not taking Jardiance currently.  Most recent GFR: 32 mL/min Most recent microalbumin: unknown Continue statin Continue [facility] in [place] and had them fill her Jardiance today Check BMP with nephrology Discussed need for medication compliance Discussed need for improved control of blood pressure Discussed need for improved control of cholesterol  Patient Goals/Self-Care Activities Over the next 90 days, patient will:  Focus on medication adherence by keeping up with prescription refills and either using a pill box or reminders to take your medications at the prescribed times Check blood pressure at least once daily, document, and provide at future appointments  Follow Up Plan: Telephone follow up appointment with care management team member scheduled for: 12/13/21      Medication Assistance:  Patient likely qualifies for Medicare LIS  Patient's preferred  pharmacy is:  [facility], [place] [address] Phone: [phone] Fax: [phone]  [facility] [address], [place] [address] Phone: [phone] Fax: [phone]  Follow Up:  Patient agrees to Care Plan and Follow-up.  Plan: Telephone follow up appointment with care management team member scheduled for:  12/13/21  Kennon Holter, PharmD, [place], CPP Clinical Pharmacist Practitioner [facility] [phone]

## 2021-11-14 NOTE — Patient Instructions (Signed)
Please have labs drawn today ° °I will be moving to [facility] located at [address], [place],  [address] effective Nov 19, 2021. °If you would like to establish care with [facility] please call [phone]. °

## 2021-11-14 NOTE — Assessment & Plan Note (Signed)
BP Readings from Last 3 Encounters:  11/14/21 (!) 167/72  11/14/21 (!) 166/55  10/11/21 (!) 157/72   -she states her home BP was 130/80s this AM, but gets elevated when she gets up and has to run to the Dr. -she states she has been taking losrtan, carvedilol, nifedipine, and hydralazine and home BP has been great -may change meds if home BPs are out of range in the future

## 2021-11-14 NOTE — Assessment & Plan Note (Signed)
-  check A1c with labs -taking rosuvastatin and losartan

## 2021-11-15 LAB — IRON,TIBC AND FERRITIN PANEL
Ferritin: 321 ng/mL — ABNORMAL HIGH (ref 15–150)
Iron Saturation: 31 % (ref 15–55)
Iron: 84 ug/dL (ref 27–139)
Total Iron Binding Capacity: 272 ug/dL (ref 250–450)
UIBC: 188 ug/dL (ref 118–369)

## 2021-11-15 LAB — CMP14+EGFR
ALT: 14 IU/L (ref 0–32)
AST: 19 IU/L (ref 0–40)
Albumin/Globulin Ratio: 1.6 (ref 1.2–2.2)
Albumin: 4.2 g/dL (ref 3.7–4.7)
Alkaline Phosphatase: 92 IU/L (ref 44–121)
BUN/Creatinine Ratio: 14 (ref 12–28)
BUN: 27 mg/dL (ref 8–27)
Bilirubin Total: <0.2 mg/dL (ref 0.0–1.2)
CO2: 22 mmol/L (ref 20–29)
Calcium: 9.3 mg/dL (ref 8.7–10.3)
Chloride: 106 mmol/L (ref 96–106)
Creatinine, Ser: 1.93 mg/dL — ABNORMAL HIGH (ref 0.57–1.00)
Globulin, Total: 2.7 g/dL (ref 1.5–4.5)
Glucose: 142 mg/dL — ABNORMAL HIGH (ref 70–99)
Potassium: 5.3 mmol/L — ABNORMAL HIGH (ref 3.5–5.2)
Sodium: 141 mmol/L (ref 134–144)
Total Protein: 6.9 g/dL (ref 6.0–8.5)
eGFR: 27 mL/min/{1.73_m2} — ABNORMAL LOW (ref >59)

## 2021-11-15 LAB — CBC WITH DIFFERENTIAL/PLATELET
Basophils Absolute: 0.1 10*3/uL (ref 0.0–0.2)
Basos: 1 %
EOS (ABSOLUTE): 0.1 10*3/uL (ref 0.0–0.4)
Eos: 2 %
Hematocrit: 35.1 % (ref 34.0–46.6)
Hemoglobin: 10.9 g/dL — ABNORMAL LOW (ref 11.1–15.9)
Immature Grans (Abs): 0 10*3/uL (ref 0.0–0.1)
Immature Granulocytes: 0 %
Lymphocytes Absolute: 1.6 10*3/uL (ref 0.7–3.1)
Lymphs: 24 %
MCH: 26.8 pg (ref 26.6–33.0)
MCHC: 31.1 g/dL — ABNORMAL LOW (ref 31.5–35.7)
MCV: 87 fL (ref 79–97)
Monocytes Absolute: 0.5 10*3/uL (ref 0.1–0.9)
Monocytes: 8 %
Neutrophils Absolute: 4.2 10*3/uL (ref 1.4–7.0)
Neutrophils: 65 %
Platelets: 243 10*3/uL (ref 150–450)
RBC: 4.06 x10E6/uL (ref 3.77–5.28)
RDW: 12.1 % (ref 11.7–15.4)
WBC: 6.4 10*3/uL (ref 3.4–10.8)

## 2021-11-15 LAB — HEMOGLOBIN A1C
Est. average glucose Bld gHb Est-mCnc: 128 mg/dL
Hgb A1c MFr Bld: 6.1 % — ABNORMAL HIGH (ref 4.8–5.6)

## 2021-11-15 LAB — LIPID PANEL WITH LDL/HDL RATIO
Cholesterol, Total: 177 mg/dL (ref 100–199)
HDL: 92 mg/dL (ref >39)
LDL Chol Calc (NIH): 70 mg/dL (ref 0–99)
LDL/HDL Ratio: 0.8 ratio (ref 0.0–3.2)
Triglycerides: 81 mg/dL (ref 0–149)
VLDL Cholesterol Cal: 15 mg/dL (ref 5–40)

## 2021-11-15 NOTE — Progress Notes (Signed)
Kidney function is a little lower. Is she seeing nephrology currently? Her A1c looks great.

## 2021-11-15 NOTE — Progress Notes (Signed)
That is what I thought. Make sure she hydrates well, and follows up with Dr. Theador Hawthorne.

## 2021-11-18 DIAGNOSIS — E1122 Type 2 diabetes mellitus with diabetic chronic kidney disease: Secondary | ICD-10-CM | POA: Diagnosis not present

## 2021-11-18 DIAGNOSIS — I10 Essential (primary) hypertension: Secondary | ICD-10-CM | POA: Diagnosis not present

## 2021-11-18 DIAGNOSIS — N1832 Chronic kidney disease, stage 3b: Secondary | ICD-10-CM | POA: Diagnosis not present

## 2021-11-18 DIAGNOSIS — N184 Chronic kidney disease, stage 4 (severe): Secondary | ICD-10-CM | POA: Diagnosis not present

## 2021-11-18 DIAGNOSIS — E785 Hyperlipidemia, unspecified: Secondary | ICD-10-CM

## 2021-11-27 DIAGNOSIS — I5032 Chronic diastolic (congestive) heart failure: Secondary | ICD-10-CM | POA: Diagnosis not present

## 2021-11-27 DIAGNOSIS — E1122 Type 2 diabetes mellitus with diabetic chronic kidney disease: Secondary | ICD-10-CM | POA: Diagnosis not present

## 2021-11-27 DIAGNOSIS — I350 Nonrheumatic aortic (valve) stenosis: Secondary | ICD-10-CM | POA: Diagnosis not present

## 2021-11-27 DIAGNOSIS — N170 Acute kidney failure with tubular necrosis: Secondary | ICD-10-CM | POA: Diagnosis not present

## 2021-11-27 DIAGNOSIS — R808 Other proteinuria: Secondary | ICD-10-CM | POA: Diagnosis not present

## 2021-11-27 DIAGNOSIS — E559 Vitamin D deficiency, unspecified: Secondary | ICD-10-CM | POA: Diagnosis not present

## 2021-11-27 DIAGNOSIS — E211 Secondary hyperparathyroidism, not elsewhere classified: Secondary | ICD-10-CM | POA: Diagnosis not present

## 2021-11-27 DIAGNOSIS — N189 Chronic kidney disease, unspecified: Secondary | ICD-10-CM | POA: Diagnosis not present

## 2021-11-27 DIAGNOSIS — I160 Hypertensive urgency: Secondary | ICD-10-CM | POA: Diagnosis not present

## 2021-12-04 DIAGNOSIS — I350 Nonrheumatic aortic (valve) stenosis: Secondary | ICD-10-CM | POA: Diagnosis not present

## 2021-12-04 DIAGNOSIS — E211 Secondary hyperparathyroidism, not elsewhere classified: Secondary | ICD-10-CM | POA: Diagnosis not present

## 2021-12-04 DIAGNOSIS — E1122 Type 2 diabetes mellitus with diabetic chronic kidney disease: Secondary | ICD-10-CM | POA: Diagnosis not present

## 2021-12-04 DIAGNOSIS — E559 Vitamin D deficiency, unspecified: Secondary | ICD-10-CM | POA: Diagnosis not present

## 2021-12-04 DIAGNOSIS — N189 Chronic kidney disease, unspecified: Secondary | ICD-10-CM | POA: Diagnosis not present

## 2021-12-04 DIAGNOSIS — D638 Anemia in other chronic diseases classified elsewhere: Secondary | ICD-10-CM | POA: Diagnosis not present

## 2021-12-04 DIAGNOSIS — I5032 Chronic diastolic (congestive) heart failure: Secondary | ICD-10-CM | POA: Diagnosis not present

## 2021-12-04 DIAGNOSIS — R808 Other proteinuria: Secondary | ICD-10-CM | POA: Diagnosis not present

## 2021-12-06 ENCOUNTER — Other Ambulatory Visit: Payer: Self-pay

## 2021-12-06 ENCOUNTER — Telehealth: Payer: Self-pay

## 2021-12-06 DIAGNOSIS — E1122 Type 2 diabetes mellitus with diabetic chronic kidney disease: Secondary | ICD-10-CM

## 2021-12-06 MED ORDER — VITAMIN D (ERGOCALCIFEROL) 1.25 MG (50000 UNIT) PO CAPS: 50000.0000 [IU] | ORAL_CAPSULE | ORAL | 0 refills | Status: DC

## 2021-12-06 MED ORDER — GABAPENTIN 100 MG PO CAPS: 100.0000 mg | ORAL_CAPSULE | Freq: Three times a day (TID) | ORAL | 2 refills | Status: DC

## 2021-12-06 NOTE — Telephone Encounter (Signed)
Patient came into the office need med refills:  Vitamin D, Ergocalciferol, (DRISDOL) 1.25 MG (50000 UNIT) CAPS capsule  gabapentin (NEURONTIN) 100 MG capsule  ferrous sulfate 325 (65 FE) MG tablet   Pharmacy:  [facility]

## 2021-12-06 NOTE — Telephone Encounter (Signed)
Refills sent

## 2021-12-13 ENCOUNTER — Telehealth: Payer: Medicare HMO

## 2021-12-19 ENCOUNTER — Ambulatory Visit: Payer: Medicare PPO | Admitting: Gastroenterology

## 2021-12-19 ENCOUNTER — Other Ambulatory Visit: Payer: Self-pay

## 2021-12-19 ENCOUNTER — Encounter: Payer: Self-pay | Admitting: Gastroenterology

## 2021-12-19 DIAGNOSIS — R195 Other fecal abnormalities: Secondary | ICD-10-CM | POA: Diagnosis not present

## 2021-12-19 NOTE — Progress Notes (Signed)
Primary Care Physician:  Noreene Larsson, NP Referring Physician : Demetrius Revel, NP Primary Gastroenterologist:  Dr.  Abbey Chatters  Chief Complaint  Patient presents with   + cologuard    Never had TCS prior. Reports had constipation from mediation she was on and then had the stool test    HPI:   Deborah Gomez is a 76 y.o. female presenting today at the request of Demetrius Revel, NP, due to positive cologuard screening. She has never had prior colonoscopy. No family history of colorectal cancer or polyps.  She did have a bout with constipation a few months ago, but this is resolved. She takes colace daily. No hematochezia or melena. No abdominal pain, N/V. No GERD symptoms. No dysphagia. No unexplained weight loss or lack of appetite.   She is hesitant to pursue colonoscopy, as she has heard complications from friends in [place]. She also has upcoming new insurance card. She would prefer to wait and call us till she receives this.   Past Medical History:  Diagnosis Date   Chronic kidney disease    Diabetes mellitus without complication (HCC)    Hypertension     Past Surgical History:  Procedure Laterality Date   APPENDECTOMY     BREAST SURGERY Left    CARPAL TUNNEL RELEASE Right    REPLACEMENT TOTAL KNEE     THYROID SURGERY      Current Outpatient Medications  Medication Sig Dispense Refill   aspirin EC 81 MG tablet Take 81 mg by mouth daily. Swallow whole.     carvedilol (COREG) 3.125 MG tablet Take 1 tablet (3.125 mg total) by mouth 2 (two) times daily with a meal. 60 tablet 2   empagliflozin (JARDIANCE) 10 MG TABS tablet Take 1 tablet (10 mg total) by mouth daily. 90 tablet 3   gabapentin (NEURONTIN) 100 MG capsule Take 1 capsule (100 mg total) by mouth 3 (three) times daily. 90 capsule 2   hydrALAZINE (APRESOLINE) 50 MG tablet Take 1 tablet by mouth 3 (three) times daily.     losartan (COZAAR) 50 MG tablet Take 50 mg by mouth daily.     NIFEdipine (PROCARDIA XL/NIFEDICAL-XL)  90 MG 24 hr tablet Take 90 mg by mouth daily.     rosuvastatin (CRESTOR) 40 MG tablet Take 1 tablet (40 mg total) by mouth daily. 90 tablet 3   Vitamin D, Ergocalciferol, (DRISDOL) 1.25 MG (50000 UNIT) CAPS capsule Take 1 capsule (50,000 Units total) by mouth every 7 (seven) days. 12 capsule 0   No current facility-administered medications for this visit.    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  Tobacco Use   Smoking status: Former    Packs/day: 0.50    Years: 52.00    Pack years: 26.00    Types: Cigarettes    Quit date: 08/21/2021    Years since quitting: 0.3   Smokeless tobacco: Never  Vaping Use   Vaping Use: Never used  Substance and Sexual Activity  Alcohol use: Not Currently   Drug use: Never   Sexual activity: Yes    Birth control/protection: Post-menopausal  Other Topics Concern   Not on file  Social History Narrative   1 child in [place], the other is not nearby   Social Determinants of Radio broadcast assistant Strain: Low Risk    Difficulty of Paying Living Expenses: Not hard at all  Food Insecurity: No Food Insecurity   Worried About Charity fundraiser in the Last Year: Never true   Arboriculturist in the Last Year: Never true  Transportation Needs: No Transportation Needs   Lack of Transportation (Medical): No   Lack of Transportation (Non-Medical): No  Physical Activity: Insufficiently Active   Days of Exercise per Week: 3 days   Minutes of Exercise per Session: 40 min  Stress: No Stress Concern Present   Feeling of Stress : Not at all   Social Connections: Moderately Isolated   Frequency of Communication with Friends and Family: More than three times a week   Frequency of Social Gatherings with Friends and Family: More than three times a week   Attends Religious Services: 1 to 4 times per year   Active Member of [employer] or Organizations: No   Attends Archivist Meetings: Never   Marital Status: Widowed  Human resources officer Violence: Not At Risk   Fear of Current or Ex-Partner: No   Emotionally Abused: No   Physically Abused: No   Sexually Abused: No    Review of Systems: Gen: Denies any fever, chills, fatigue, weight loss, lack of appetite.  CV: Denies chest pain, heart palpitations, peripheral edema, syncope.  Resp: Denies shortness of breath at rest or with exertion. Denies wheezing or cough.  GI: see HPI  GU : Denies urinary burning, urinary frequency, urinary hesitancy MS: Denies joint pain, muscle weakness, cramps, or limitation of movement.  Derm: Denies rash, itching, dry skin Psych: Denies depression, anxiety, memory loss, and confusion Heme: Denies bruising, bleeding, and enlarged lymph nodes.  Physical Exam: BP (!) 156/81    Pulse 74    Temp (!) 97.1 F (36.2 C)    Ht 5\' 4"  (1.626 m)    Wt 172 lb (78 kg)    BMI 29.52 kg/m  General:   Alert and oriented. Pleasant and cooperative. Well-nourished and well-developed.  Head:  Normocephalic and atraumatic. Eyes:  Without icterus, sclera clear and conjunctiva pink.  Ears:  Normal auditory acuity. Mouth:  mask in place Lungs:  Clear to auscultation bilaterally. No wheezes, rales, or rhonchi. No distress.  Heart:  S1, S2 present with systolic murmur  Abdomen:  +BS, soft, non-tender and non-distended. No HSM noted. No guarding or rebound. No masses appreciated.  Rectal:  Deferred  Msk:  Symmetrical without gross deformities. Normal posture. Extremities:  Without edema. Neurologic:  Alert and  oriented x4;  grossly normal neurologically. Skin:  Intact  without significant lesions or rashes. Psych:  Alert and cooperative. Normal mood and affect.  ASSESSMENT: Deborah Gomez is a 76 y.o. female presenting today with recent positive Cologuard test but without any concerning lower or upper GI signs/symptoms. No prior colonoscopy and no family history of colorectal cancer or polyps.  She is in the midst of obtaining a new insurance card and has been hesitant regarding colonoscopy. She would like to wait until she receives the card and then call us to arrange colonoscopy.  We did discuss Cologuard findings and need to exclude any polyps  or occult malignancy. She is aware of this.    PLAN: Patient to call when ready to proceed Pursue colonoscopy with Dr. Abbey Chatters when patient is able to proceed.  Annitta Needs, PhD, ANP-BC [facility]astroenter[facility]

## 2021-12-19 NOTE — Patient Instructions (Signed)
Please call us when you are ready to proceed with the colonoscopy!  It was a pleasure to see you today. I want to create trusting relationships with patients to provide genuine, compassionate, and quality care. I value your feedback. If you receive a survey regarding your visit,  I greatly appreciate you taking time to fill this out.   Annitta Needs, PhD, ANP-BC [facility]astroenter[facility]

## 2021-12-21 ENCOUNTER — Ambulatory Visit (INDEPENDENT_AMBULATORY_CARE_PROVIDER_SITE_OTHER): Payer: Medicare HMO | Admitting: Pharmacist

## 2021-12-21 DIAGNOSIS — E1122 Type 2 diabetes mellitus with diabetic chronic kidney disease: Secondary | ICD-10-CM

## 2021-12-21 DIAGNOSIS — E785 Hyperlipidemia, unspecified: Secondary | ICD-10-CM

## 2021-12-21 DIAGNOSIS — N1832 Chronic kidney disease, stage 3b: Secondary | ICD-10-CM

## 2021-12-21 DIAGNOSIS — I10 Essential (primary) hypertension: Secondary | ICD-10-CM

## 2021-12-21 NOTE — Chronic Care Management (AMB) (Signed)
Chronic Care Management Pharmacy Note  12/21/2021 Name:  Deborah Gomez MRN:  [id number] DOB:  06-10-1946  Summary: General: Lipid panel results from December were reviewed with patient. Improving lipid profile. LDL now 70.   Type 2 Diabetes  Controlled; Most recent A1c at goal of <7% per ADA guidelines Current medications: empagliflozin (Jardiance) 10 mg by mouth once daily No metformin due to renal function Taking medications as directed: no, patient reports she ran out of Jardiance last week. She was unable to afford her copay. She continues to report that she has Medicare Extra Help. She reports she called [employer] and plans to make some switch in her insurance coverage that she thinks will resolve the issue and then she will be able to resume Jardiance. Current glucose readings: patient reports stable blood glucose despite being without Jardiance temporarily Continue empagliflozin (Jardiance) 10 mg by mouth once daily Instructed to monitor blood sugars once a day at the following times: fasting (at least 8 hours since last food consumption)  Follow-up on cost with Jardiance. Unable to apply for patient assistance program for Jardiance until patient supplies me with Medicare Extra Help denial.   Hypertension Patient follows closely with nephrology (Dr. Theador Hawthorne) regarding hypertension management Blood pressure under fair control. Home blood pressure cuff previously validated. Home blood pressure is at goal of <130/80 mmHg per 2017 AHA/ACC guidelines about 50% of the time. Current medications: losartan 50 mg by mouth once daily, nifedipine 90 mg by mouth once daily, carvedilol 3.125 mg by mouth twice daily, and hydralazine 50 mg by mouth three times daily Current home blood pressure: 120s-140s/60-70s Continue follow-up with nephrology  Hyperlipidemia Controlled. LDL at goal of <70 due to very high risk given diabetes + at least 1 additional major risk factor (hypertension and chronic  kidney disease (CKD)) per 2020 AACE/ACE guidelines. Triglycerides at goal of <150 per 2020 AACE/ACE guidelines. Continue rosuvastatin 40 mg by mouth once daily Patient has stopped smoking after Chantix x1 month. Denies cravings or relapse.   Chronic Kidney Disease Stage 4 Followed by Dr. Theador Hawthorne Continue statin, angiotensin receptor blocker, and SGLT-2 inhibitor  Subjective: Deborah Gomez is an 76 y.o. year old female who is a primary patient of Noreene Larsson, NP.  The CCM team was consulted for assistance with disease management and care coordination needs.    Engaged with patient by telephone for follow up visit in response to provider referral for pharmacy case management and/or care coordination services.   Consent to Services:  The patient was given information about Chronic Care Management services, agreed to services, and gave verbal consent prior to initiation of services.  Please see initial visit note for detailed documentation.   Patient Care Team: Noreene Larsson, NP as PCP - General (Nurse Practitioner) Beryle Lathe, [facility] (Pharmacist)  Objective:  Lab Results  Component Value Date   CREATININE 1.93 (H) 11/14/2021   CREATININE 1.67 (H) 07/12/2021   CREATININE 1.65 (H) 05/15/2021    Lab Results  Component Value Date   HGBA1C 6.1 (H) 11/14/2021   Last diabetic Eye exam: No results found for: HMDIABEYEEXA  Last diabetic Foot exam: No results found for: HMDIABFOOTEX      Component Value Date/Time   CHOL 177 11/14/2021 1359   TRIG 81 11/14/2021 1359   HDL 92 11/14/2021 1359   LDLCALC 70 11/14/2021 1359    Hepatic Function Latest Ref Rng & Units 11/14/2021 07/12/2021 05/15/2021  Total Protein 6.0 - 8.5 g/dL 6.9 6.5 6.7  Albumin 3.7 - 4.7 g/dL 4.2 4.0 3.5  AST 0 - 40 IU/L _0 ALT 0 - 32 IU/L _1 Alk Phosphatase 44 - 121 IU/L 92 108 85  Total Bilirubin 0.0 - 1.2 mg/dL <0.2 <0.2 0.1(L)    Lab Results  Component Value Date/Time   TSH 0.984  07/12/2021 11:45 AM   TSH 1.895 05/16/2021 02:16 AM   FREET4 1.33 07/12/2021 11:45 AM    CBC Latest Ref Rng & Units 11/14/2021 07/12/2021 05/15/2021  WBC 3.4 - 10.8 x10E3/uL 6.4 7.4 6.0  Hemoglobin 11.1 - 15.9 g/dL 10.9(L) 11.7 13.0  Hematocrit 34.0 - 46.6 % 35.1 36.7 44.0  Platelets 150 - 450 x10E3/uL 243 287 215    Lab Results  Component Value Date/Time   VD25OH 14.7 (L) 07/12/2021 11:45 AM    Clinical ASCVD: No  The 10-year ASCVD risk score (Arnett DK, et al., 2019) is: 71.5%   Values used to calculate the score:     Age: 76 years     Sex: Female     Is Non-Hispanic African American: Yes     Diabetic: Yes     Tobacco smoker: Yes     Systolic Blood Pressure: 272 mmHg     Is BP treated: Yes     HDL Cholesterol: 92 mg/dL     Total Cholesterol: 177 mg/dL    Social History   Tobacco Use  Smoking Status Former   Packs/day: 0.50   Years: 52.00   Pack years: 26.00   Types: Cigarettes   Quit date: 08/21/2021   Years since quitting: 0.3  Smokeless Tobacco Never   BP Readings from Last 3 Encounters:  12/19/21 (!) 156/81  11/14/21 (!) 167/72  11/14/21 (!) 166/55   Pulse Readings from Last 3 Encounters:  12/19/21 74  11/14/21 77  11/14/21 73   Wt Readings from Last 3 Encounters:  12/19/21 172 lb (78 kg)  11/14/21 166 lb 0.6 oz (75.3 kg)  08/16/21 163 lb 0.6 oz (74 kg)    Assessment: Review of patient past medical history, allergies, medications, health status, including review of consultants reports, laboratory and other test data, was performed as part of comprehensive evaluation and provision of chronic care management services.   SDOH:  (Social Determinants of Health) assessments and interventions performed:    CCM Care Plan  No Known Allergies  Medications Reviewed Today     Reviewed by Beryle Lathe, [facility] (Pharmacist) on 12/21/21 at 60  Med List Status: <None>   Medication Order Taking? Sig Documenting Provider Last Dose Status Informant   aspirin EC 81 MG tablet [id number] Yes Take 81 mg by mouth daily. Swallow whole. [provider] Taking Active   carvedilol (COREG) 3.125 MG tablet [id number] Yes Take 1 tablet (3.125 mg total) by mouth 2 (two) times daily with a meal. Noreene Larsson, NP Taking Active   empagliflozin (JARDIANCE) 10 MG TABS tablet [id number] No Take 1 tablet (10 mg total) by mouth daily.  Patient not taking: Reported on 12/21/2021   Noreene Larsson, NP Not Taking Active   gabapentin (NEURONTIN) 100 MG capsule [id number] Yes Take 1 capsule (100 mg total) by mouth 3 (three) times daily. Renee Rival, FNP Taking Active   hydrALAZINE (APRESOLINE) 50 MG tablet [id number] Yes Take 1 tablet by mouth 3 (three) times daily. [provider] Taking Active   losartan (COZAAR) 50 MG tablet [id number] Yes Take 50 mg by mouth daily. [provider]  Taking Active   NIFEdipine (PROCARDIA XL/NIFEDICAL-XL) 90 MG 24 hr tablet [id number] Yes Take 90 mg by mouth daily. [provider] Taking Active   rosuvastatin (CRESTOR) 40 MG tablet [id number] Yes Take 1 tablet (40 mg total) by mouth daily. Noreene Larsson, NP Taking Active   Vitamin D, Ergocalciferol, (DRISDOL) 1.25 MG (50000 UNIT) CAPS capsule [id number] Yes Take 1 capsule (50,000 Units total) by mouth every 7 (seven) days. 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  Conditions to be addressed/monitored: HTN,  HLD, DMII, and CKD Stage 4  Care Plan : Medication Management  Updates made by Beryle Lathe, [place] since 12/21/2021 12:00 AM     Problem: HTN, T2DM, HLD, CKD   Priority: High  Onset Date: 07/20/2021     Long-Range Goal: Disease Progression Prevention   Start Date: 07/20/2021  Expected End Date: 10/18/2021  Recent Progress: On track  Priority: High  Note:   Current Barriers:  Unable to achieve control of hypertension  Pharmacist Clinical Goal(s):  Through collaboration with PharmD and provider, patient will: Achieve control of hypertension as evidenced by improved blood pressure control   Interventions: 1:1 collaboration with Vena Rua, NP regarding development and update of comprehensive plan of care as evidenced by provider attestation and co-signature Inter-disciplinary care team collaboration (see longitudinal plan of care) Comprehensive medication review performed; medication list updated in electronic medical record  Type 2 Diabetes - Condition stable. Not addressed this visit.: Controlled; Most recent A1c at goal of <7% per ADA guidelines Current medications: empagliflozin (Jardiance) 10 mg by mouth once daily No metformin due to renal function Intolerances: none Taking medications as directed: no, patient reports she ran out of Jardiance last week. She was unable to afford her copay. She continues to report that she has Medicare Extra Help. She reports she called [employer] and plans to make some switch in her insurance coverage that she thinks will resolve the issue and then she will be able to resume Jardiance. Side effects thought to be attributed to current medication regimen: no Denies hypoglycemic/hyperglycemic symptoms Hypoglycemia prevention: not indicated at this time Current meal patterns: not discussed today Current exercise: not discussed today On a statin: yes On aspirin 81 mg daily: yes Last microalbumin/creatinine ratio: unknown; on an ACEi/ARB:  yes Current glucose readings:  patient reports stable blood glucose despite being without Jardiance temporarily Continue empagliflozin (Jardiance) 10 mg by mouth once daily Instructed to monitor blood sugars once a day at the following times: fasting (at least 8 hours since last food consumption)  Follow-up on cost with Jardiance. Unable to apply for patient assistance program for Jardiance until patient supplies me with Medicare Extra Help denial.   Hypertension - Goal on Track (progressing): YES.: Patient follows closely with nephrology (Dr. Theador Hawthorne) regarding hypertension management Blood pressure under fair control. Home blood pressure cuff previously validated. Home blood pressure is at goal of <130/80 mmHg per  2017 AHA/ACC guidelines about 50% of the time. Current medications: losartan 50 mg by mouth once daily, nifedipine 90 mg by mouth once daily, carvedilol 3.125 mg by mouth twice daily, and hydralazine 50 mg by mouth three times daily Intolerances: unknown Taking medications as directed: yes Side effects thought to be attributed to current medication regimen: no Current home blood pressure:  120s-140s/60-70s Continue current medications as above Encourage dietary sodium restriction/DASH diet Recommend home blood pressure monitoring to discuss at next visit Continue follow-up with nephrology  Hyperlipidemia - Goal Met.: Controlled. LDL at goal of <70 due to very high risk given diabetes + at least 1 additional major risk factor (hypertension and chronic kidney disease (CKD)) per 2020 AACE/ACE guidelines. Triglycerides at goal of <150 per 2020 AACE/ACE guidelines. Current medications: rosuvastatin 40 mg by mouth once daily Intolerances: none Taking medications as directed: yes Side effects thought to be attributed to current medication regimen: no Continue rosuvastatin 40 mg by mouth once daily Encourage dietary reduction of high fat containing foods such as butter, nuts, bacon, egg  yolks, etc. Patient has stopped smoking after Chantix x1 month. Denies cravings or relapse.   Chronic Kidney Disease Stage 4 - GFR 15-29 (Severely Reduced Function) - Goal on Track (progressing): YES.: Followed by Dr. Theador Hawthorne Current medications: losartan 50 mg by mouth once daily, empagliflozin (Jardiance) 10 mg by mouth once daily, and rosuvastatin 40 mg by mouth once daily Patient reports she ran out of Jardiance last week. She was unable to afford her copay. She continues to report that she has Medicare Extra Help. She reports she called [employer] and plans to make some switch in her insurance coverage that she thinks will resolve the issue and then she will be able to resume Jardiance. Most recent GFR: 27 mL/min Most recent microalbumin: unknown Continue statin, angiotensin receptor blocker, and SGLT-2 inhibitor Check BMP with nephrology Discussed need for medication compliance Discussed need for improved control of blood pressure  Patient Goals/Self-Care Activities Patient will:  Take medications as prescribed Check blood pressure at least once daily, document, and provide at future appointments  Follow Up Plan: Telephone follow up appointment with care management team member scheduled for: 02/22/22     Medication Assistance: None required.  Patient affirms current coverage meets needs.  Patient's preferred pharmacy is:  [facility] [address], [place] [address] Phone: [phone] Fax: [phone]  Follow Up:  Patient agrees to Care Plan and Follow-up.  Plan: Telephone follow up appointment with care management team member scheduled for:  02/22/22  Kennon Holter, PharmD, [place], [place] Clinical Pharmacist Practitioner [facility] [phone]

## 2021-12-21 NOTE — Patient Instructions (Signed)
Deborah Gomez,  It was great to talk to you today!  Please call me with any questions or concerns.  Visit Information  Following are the goals we discussed today:   Goals Addressed             This Visit's Progress    Medication Management       Patient Goals/Self-Care Activities Patient will:  Take medications as prescribed Check blood pressure at least once daily, document, and provide at future appointments               Follow-up plan: Telephone follow up appointment with care management team member scheduled for:  02/22/22  The patient verbalized understanding of instructions, educational materials, and care plan provided today and agreed to receive a mailed copy of patient instructions, educational materials, and care plan.   Please call the care guide team at [phone] if you need to cancel or reschedule your appointment.   Kennon Holter, PharmD, [place], CPP Clinical Pharmacist Practitioner [facility] [phone]

## 2021-12-22 ENCOUNTER — Other Ambulatory Visit: Payer: Self-pay

## 2021-12-22 ENCOUNTER — Ambulatory Visit: Payer: Medicare HMO | Admitting: Cardiology

## 2021-12-22 ENCOUNTER — Encounter: Payer: Self-pay | Admitting: Cardiology

## 2021-12-22 VITALS — BP 140/77 | HR 72 | Ht 64.0 in | Wt 171.2 lb

## 2021-12-22 DIAGNOSIS — I358 Other nonrheumatic aortic valve disorders: Secondary | ICD-10-CM | POA: Diagnosis not present

## 2021-12-22 DIAGNOSIS — E782 Mixed hyperlipidemia: Secondary | ICD-10-CM | POA: Diagnosis not present

## 2021-12-22 DIAGNOSIS — I10 Essential (primary) hypertension: Secondary | ICD-10-CM

## 2021-12-22 DIAGNOSIS — I5189 Other ill-defined heart diseases: Secondary | ICD-10-CM | POA: Diagnosis not present

## 2021-12-22 MED ORDER — HYDRALAZINE HCL 50 MG PO TABS: 75.0000 mg | ORAL_TABLET | Freq: Three times a day (TID) | ORAL | 3 refills | Status: DC

## 2021-12-22 NOTE — Progress Notes (Signed)
Clinical Summary Ms. Propps is a 76 y.o.female seen as new consult, referred by Dr Theador Hawthorne for the following medical problems.  1.Diastolic dysfunction  - 05/4080 echo: LVEF 65-70%, no WMAs, severe asymmetric LVH(on my measurement septum 1.5 cm and PW 1.3 cm), grade I DD. Normal RV, mild MR - infrequent edema.. 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 Aortic sclerosis - mild to mod sclerosis, no significant stenosis. Mean grad 6 mmHg.    4. HTN - bp's 130-140s/60-70s at home - compliant with meds  5. Hyperlipidemia - 10/2021 TC 177 TG 81 HDL 92 LDL 70 Past Medical History:  Diagnosis Date   Chronic kidney disease    Diabetes mellitus without complication (HCC)    Hypertension      No Known Allergies   Current Outpatient Medications  Medication Sig Dispense Refill   aspirin EC 81 MG tablet Take 81 mg by mouth daily. Swallow whole.     carvedilol (COREG) 3.125 MG tablet Take 1 tablet (3.125 mg total) by mouth 2 (two) times daily with a meal. 60 tablet 2   empagliflozin (JARDIANCE) 10 MG TABS tablet Take 1 tablet (10 mg total) by mouth daily. (Patient not taking: Reported on 12/21/2021) 90 tablet 3   gabapentin (NEURONTIN) 100 MG capsule Take 1 capsule (100 mg total) by mouth 3 (three) times daily. 90 capsule 2   hydrALAZINE (APRESOLINE) 50 MG tablet Take 1 tablet by mouth 3 (three) times daily.     losartan (COZAAR) 50 MG tablet Take 50 mg by mouth daily.     NIFEdipine (PROCARDIA XL/NIFEDICAL-XL) 90 MG 24 hr tablet Take 90 mg by mouth daily.     rosuvastatin (CRESTOR) 40 MG tablet Take 1 tablet (40 mg total) by mouth daily. 90 tablet 3   Vitamin D, Ergocalciferol, (DRISDOL) 1.25 MG (50000 UNIT) CAPS capsule Take 1 capsule (50,000 Units total) by mouth every 7 (seven) days. 12 capsule 0   No current facility-administered medications for this visit.     Past Surgical History:  Procedure Laterality Date   APPENDECTOMY     BREAST  SURGERY Left    CARPAL TUNNEL RELEASE Right    REPLACEMENT TOTAL KNEE     THYROID SURGERY       No Known Allergies    Family History  Problem Relation Age of Onset   Colon cancer Neg Hx    Colon polyps Neg Hx      Social History Ms. Leduc reports that she quit smoking about 4 months ago. Her smoking use included cigarettes. She has a 26.00 pack-year smoking history. She has never used smokeless tobacco. Ms. Beltran reports that she does not currently use alcohol.   Review of Systems CONSTITUTIONAL: No weight loss, fever, chills, weakness or fatigue.  HEENT: Eyes: No visual loss, blurred vision, double vision or yellow sclerae.No hearing loss, sneezing, congestion, runny nose or sore throat.  SKIN: No rash or itching.  CARDIOVASCULAR: per hpi RESPIRATORY: No shortness of breath, cough or sputum.  GASTROINTESTINAL: No anorexia, nausea, vomiting or diarrhea. No abdominal pain or blood.  GENITOURINARY: No burning on urination, no polyuria NEUROLOGICAL: No headache, dizziness, syncope, paralysis, ataxia, numbness or tingling in the extremities. No change in bowel or bladder control.  MUSCULOSKELETAL: No muscle, back pain, joint pain or stiffness.  LYMPHATICS: No enlarged nodes. No history of splenectomy.  PSYCHIATRIC: No history of depression or anxiety.  ENDOCRINOLOGIC: No reports of sweating,  cold or heat intolerance. No polyuria or polydipsia.  Marland Kitchen   Physical Examination Today's Vitals   12/22/21 0951  BP: 140/77  Pulse: 72  SpO2: 99%  Weight: 171 lb 3.2 oz (77.7 kg)  Height: 5\' 4"  (1.626 m)   Body mass index is 29.39 kg/m.  Gen: resting comfortably, no acute distress HEENT: no scleral icterus, pupils equal round and reactive, no palptable cervical adenopathy,  CV: RRR, 3/6 systolic murmur rusb, no jvd Resp: Clear to auscultation bilaterally GI: abdomen is soft, non-tender, non-distended, normal bowel sounds, no hepatosplenomegaly MSK: extremities are warm, no  edema.  Skin: warm, no rash Neuro:  no focal deficits Psych: appropriate affect   Diagnostic Studies 07/2021 echo IMPRESSIONS     1. Left ventricular ejection fraction, by estimation, is 65 to 70%. The  left ventricle has normal function. The left ventricle has no regional  wall motion abnormalities. There is severe asymmetric left ventricular  hypertrophy of the septal segment. Left   ventricular diastolic parameters are consistent with Grade I diastolic  dysfunction (impaired relaxation).   2. Right ventricular systolic function is normal. The right ventricular  size is normal. There is normal pulmonary artery systolic pressure. The  estimated right ventricular systolic pressure is 88.4 mmHg.   3. A small pericardial effusion is present. The pericardial effusion is  posterior to the left ventricle.   4. The mitral valve is grossly normal. Mild mitral valve regurgitation.   5. The aortic valve is tricuspid. There is moderate calcification of the  aortic valve. Aortic valve regurgitation is not visualized. Mild to  moderate aortic valve sclerosis/calcification is present, without any  evidence of aortic stenosis.   6. The inferior vena cava is normal in size with greater than 50%  respiratory variability, suggesting right atrial pressure of 3 mmHg.       Assessment and Plan  Diastolic dysfunction - noted on echo, no significant HF symptoms - continue bp control  2. Aortic sclerosis - echo with signs of aortic sclerosis, no significant stenosis. Continue to monitor at this time  3. Hyperlipidemia - at goal, continue current meds  4. HTN - above goal, increase hydralazine to 75mg  tid.    EKG today shows NSR  F/u 1 year      Arnoldo Lenis, M.D

## 2021-12-22 NOTE — Addendum Note (Signed)
Addended by: Barbarann Ehlers A on: 12/22/2021 11:03 AM   Modules accepted: Orders

## 2021-12-22 NOTE — Patient Instructions (Signed)
Medication Instructions:  INCREASE Hydralazine to 75 mg three times a day   Labwork: None today  Testing/Procedures: None today  Follow-Up: 1 year  Any Other Special Instructions Will Be Listed Below (If Applicable).  If you need a refill on your cardiac medications before your next appointment, please call your pharmacy.

## 2022-01-09 ENCOUNTER — Ambulatory Visit (INDEPENDENT_AMBULATORY_CARE_PROVIDER_SITE_OTHER): Payer: Medicare HMO | Admitting: Family Medicine

## 2022-01-09 ENCOUNTER — Other Ambulatory Visit: Payer: Self-pay

## 2022-01-09 VITALS — BP 159/70 | HR 68 | Temp 98.1°F | Ht 64.0 in | Wt 173.0 lb

## 2022-01-09 DIAGNOSIS — I10 Essential (primary) hypertension: Secondary | ICD-10-CM | POA: Diagnosis not present

## 2022-01-09 DIAGNOSIS — N184 Chronic kidney disease, stage 4 (severe): Secondary | ICD-10-CM | POA: Diagnosis not present

## 2022-01-09 DIAGNOSIS — D638 Anemia in other chronic diseases classified elsewhere: Secondary | ICD-10-CM | POA: Insufficient documentation

## 2022-01-09 DIAGNOSIS — E782 Mixed hyperlipidemia: Secondary | ICD-10-CM | POA: Diagnosis not present

## 2022-01-09 DIAGNOSIS — E1122 Type 2 diabetes mellitus with diabetic chronic kidney disease: Secondary | ICD-10-CM | POA: Diagnosis not present

## 2022-01-09 DIAGNOSIS — E049 Nontoxic goiter, unspecified: Secondary | ICD-10-CM | POA: Insufficient documentation

## 2022-01-09 DIAGNOSIS — R809 Proteinuria, unspecified: Secondary | ICD-10-CM | POA: Insufficient documentation

## 2022-01-09 NOTE — Assessment & Plan Note (Signed)
Patient states that her blood pressures are much improved at home.  We will continue to monitor closely.  Continue current meds.

## 2022-01-09 NOTE — Assessment & Plan Note (Signed)
A1c at goal.  Continue Jardiance.  We will have to monitor this closely given low GFR.

## 2022-01-09 NOTE — Assessment & Plan Note (Signed)
At goal.  Continue Crestor. 

## 2022-01-09 NOTE — Patient Instructions (Signed)
Continue your current medications.  Keep an eye on your BP.  Follow up in 6 months.  Take care  Dr. Lacinda Axon

## 2022-01-09 NOTE — Progress Notes (Signed)
Subjective:  Patient ID: Deborah Gomez, female    DOB: [birth year]  Age: 76 y.o. MRN: [id number]  CC: Chief Complaint  Patient presents with   type 2 DM    Establish care   Hypertension    Establish care     HPI:  76 year old female with an extensive past medical history as outlined below presents for new patient visit to establish care.  Patient states that she is doing well.  Denies shortness of breath.  No hematochezia or melena.  No chest pain.  Per the EMR, patient's blood pressures have been difficult to control.  She endorses better readings at home, with systolic blood pressure in the 130s to 140s.  She is currently on carvedilol, hydralazine, losartan, and nifedipine.  She follows with nephrology.  BP elevated here today.  Patient has chronic kidney disease.  Most recent GFR was 25.  Patient now has chronic kidney disease stage IV.  She follows with nephrology very closely.  Renal function has been stable with a creatinine around 2.  Anemia of chronic disease is stable.  Type 2 diabetes has been well controlled.  Most recent A1c in December was 6.1.  She is currently on Jardiance 10 mg daily.  We will have to be very careful with this as this medication is not recommended with a GFR less than 20.  Hyperlipidemia has been well controlled.  She is on max dose Crestor.  Recent LDL of 70.  Recent positive Cologuard.  Patient is awaiting scheduling regarding colonoscopy.  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   Anemia of chronic disease 01/09/2022   Goiter 01/09/2022   Positive colorectal cancer screening using Cologuard test 12/19/2021   HLD (hyperlipidemia) 08/16/2021   Vitamin D deficiency 06/06/2021   DMII (diabetes mellitus, type 2) ([place]) 05/16/2021   Hypertension 05/15/2021    Social Hx   Social History   Socioeconomic History    Marital status: Widowed    Spouse name: Not on file   Number of children: 2   Years of education: Not on file   Highest education level: Not on file  Occupational History   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  Tobacco Use   Smoking status: Former    Packs/day: 0.50    Years: 52.00    Pack years: 26.00    Types: Cigarettes    Quit date: 08/21/2021    Years since quitting: 0.3   Smokeless tobacco: Never  Vaping Use   Vaping Use: Never used  Substance and Sexual Activity   Alcohol use: Not Currently   Drug use: Never   Sexual activity: Yes    Birth control/protection: Post-menopausal  Other Topics Concern   Not on file  Social History Narrative   1 child in [place], the other is not nearby   Social Determinants of Radio broadcast assistant Strain: Low Risk    Difficulty of Paying Living Expenses: Not hard at all  Food Insecurity: No Food Insecurity   Worried About Charity fundraiser in the Last Year: Never true   Arboriculturist in the Last Year: Never true  Transportation Needs: No Transportation Needs   Lack of Transportation (Medical): No   Lack of Transportation (Non-Medical): No  Physical Activity: Insufficiently Active   Days of Exercise per Week: 3 days  Minutes of Exercise per Session: 40 min  Stress: No Stress Concern Present   Feeling of Stress : Not at all  Social Connections: Moderately Isolated   Frequency of Communication with Friends and Family: More than three times a week   Frequency of Social Gatherings with Friends and Family: More than three times a week   Attends Religious Services: 1 to 4 times per year   Active Member of [employer] or Organizations: No   Attends Archivist Meetings: Never   Marital Status: Widowed    Review of Systems Per HPI  Objective:  BP (!) 159/70    Pulse 68    Temp 98.1 F (36.7 C)    Ht 5\' 4"  (1.626 m)    Wt 173 lb (78.5 kg)    SpO2 100%    BMI 29.70 kg/m    BP/Weight 01/09/2022 12/22/2021 9/35/7017  Systolic BP 793 903 009  Diastolic BP 70 77 81  Wt. (Lbs) 173 171.2 172  BMI 29.7 29.39 29.52    Physical Exam Constitutional:      Appearance: Normal appearance.  HENT:     Head: Normocephalic and atraumatic.     Mouth/Throat:     Mouth: Mucous membranes are moist.     Pharynx: Oropharynx is clear. No oropharyngeal exudate or posterior oropharyngeal erythema.  Eyes:     General:        Right eye: No discharge.        Left eye: No discharge.     Conjunctiva/sclera: Conjunctivae normal.  Neck:     Comments: Large goiter noted. Cardiovascular:     Rate and Rhythm: Normal rate and regular rhythm.  Pulmonary:     Effort: Pulmonary effort is normal.     Breath sounds: Normal breath sounds. No wheezing, rhonchi or rales.  Abdominal:     General: There is no distension.     Palpations: Abdomen is soft.     Tenderness: There is no abdominal tenderness.  Neurological:     Mental Status: She is alert.  Psychiatric:        Mood and Affect: Mood normal.        Behavior: Behavior normal.    Lab Results  Component Value Date   WBC 6.4 11/14/2021   HGB 10.9 (L) 11/14/2021   HCT 35.1 11/14/2021   PLT 243 11/14/2021   GLUCOSE 142 (H) 11/14/2021   CHOL 177 11/14/2021   TRIG 81 11/14/2021   HDL 92 11/14/2021   LDLCALC 70 11/14/2021   ALT 14 11/14/2021   AST 19 11/14/2021   NA 141 11/14/2021   K 5.3 (H) 11/14/2021   CL 106 11/14/2021   CREATININE 1.93 (H) 11/14/2021   BUN 27 11/14/2021   CO2 22 11/14/2021   TSH 0.984 07/12/2021   HGBA1C 6.1 (H) 11/14/2021     Assessment & Plan:   Problem List Items Addressed This Visit       Cardiovascular and Mediastinum   Hypertension    Patient states that her blood pressures are much improved at home.  We will continue to monitor closely.  Continue current meds.        Endocrine   DMII (diabetes mellitus, type 2) ([place]) - Primary    A1c at goal.  Continue Jardiance.  We will have to  monitor this closely given low GFR.        Genitourinary   CKD (chronic kidney disease) stage 4, GFR 15-29 ml/min (HCC)    Creatinine  stable.  Continue close follow-up with nephrology.        Other   HLD (hyperlipidemia)    At goal.  Continue Crestor.       Follow-up:  Return in about 6 months (around 07/09/2022).  [place]

## 2022-01-09 NOTE — Assessment & Plan Note (Signed)
Creatinine stable.  Continue close follow-up with nephrology.

## 2022-01-16 DIAGNOSIS — E1122 Type 2 diabetes mellitus with diabetic chronic kidney disease: Secondary | ICD-10-CM

## 2022-01-16 DIAGNOSIS — N1832 Chronic kidney disease, stage 3b: Secondary | ICD-10-CM

## 2022-01-16 DIAGNOSIS — E785 Hyperlipidemia, unspecified: Secondary | ICD-10-CM

## 2022-01-16 DIAGNOSIS — N184 Chronic kidney disease, stage 4 (severe): Secondary | ICD-10-CM

## 2022-01-16 DIAGNOSIS — I10 Essential (primary) hypertension: Secondary | ICD-10-CM

## 2022-01-31 ENCOUNTER — Other Ambulatory Visit: Payer: Self-pay

## 2022-01-31 DIAGNOSIS — I10 Essential (primary) hypertension: Secondary | ICD-10-CM

## 2022-01-31 DIAGNOSIS — E785 Hyperlipidemia, unspecified: Secondary | ICD-10-CM

## 2022-01-31 MED ORDER — ROSUVASTATIN CALCIUM 40 MG PO TABS: 40.0000 mg | ORAL_TABLET | Freq: Every day | ORAL | 0 refills | Status: DC

## 2022-01-31 MED ORDER — CARVEDILOL 3.125 MG PO TABS: 3.1250 mg | ORAL_TABLET | Freq: Two times a day (BID) | ORAL | 0 refills | Status: DC

## 2022-01-31 NOTE — Telephone Encounter (Signed)
Patient notified

## 2022-01-31 NOTE — Telephone Encounter (Signed)
Pt came in office to request a refill of  ?carvedilol (COREG) 3.125 MG tablet [[id number]]  ?  Order Details ?Dose: 3.125 mg Route: Oral Frequency: 2 times daily with meals ?Dispense Quantity: 60 tablet Refills: 2  ? ?rosuvastatin (CRESTOR) 40 MG tablet [[id number]]  ?  Order Details ?Dose: 40 mg Route: Oral Frequency: Daily ?Dispense Quantity: 90 tablet Refills: 3  ? ?Cb#: [phone] ?

## 2022-02-06 ENCOUNTER — Ambulatory Visit: Payer: Medicare HMO | Admitting: Nurse Practitioner

## 2022-02-06 DIAGNOSIS — D638 Anemia in other chronic diseases classified elsewhere: Secondary | ICD-10-CM | POA: Diagnosis not present

## 2022-02-06 DIAGNOSIS — E211 Secondary hyperparathyroidism, not elsewhere classified: Secondary | ICD-10-CM | POA: Diagnosis not present

## 2022-02-06 DIAGNOSIS — E559 Vitamin D deficiency, unspecified: Secondary | ICD-10-CM | POA: Diagnosis not present

## 2022-02-06 DIAGNOSIS — E1122 Type 2 diabetes mellitus with diabetic chronic kidney disease: Secondary | ICD-10-CM | POA: Diagnosis not present

## 2022-02-06 DIAGNOSIS — I5032 Chronic diastolic (congestive) heart failure: Secondary | ICD-10-CM | POA: Diagnosis not present

## 2022-02-06 DIAGNOSIS — N189 Chronic kidney disease, unspecified: Secondary | ICD-10-CM | POA: Diagnosis not present

## 2022-02-06 DIAGNOSIS — R808 Other proteinuria: Secondary | ICD-10-CM | POA: Diagnosis not present

## 2022-02-13 DIAGNOSIS — I5033 Acute on chronic diastolic (congestive) heart failure: Secondary | ICD-10-CM | POA: Diagnosis not present

## 2022-02-13 DIAGNOSIS — N189 Chronic kidney disease, unspecified: Secondary | ICD-10-CM | POA: Diagnosis not present

## 2022-02-13 DIAGNOSIS — I350 Nonrheumatic aortic (valve) stenosis: Secondary | ICD-10-CM | POA: Diagnosis not present

## 2022-02-13 DIAGNOSIS — R808 Other proteinuria: Secondary | ICD-10-CM | POA: Diagnosis not present

## 2022-02-13 DIAGNOSIS — E1122 Type 2 diabetes mellitus with diabetic chronic kidney disease: Secondary | ICD-10-CM | POA: Diagnosis not present

## 2022-02-13 DIAGNOSIS — D638 Anemia in other chronic diseases classified elsewhere: Secondary | ICD-10-CM | POA: Diagnosis not present

## 2022-02-13 DIAGNOSIS — E211 Secondary hyperparathyroidism, not elsewhere classified: Secondary | ICD-10-CM | POA: Diagnosis not present

## 2022-02-13 DIAGNOSIS — E559 Vitamin D deficiency, unspecified: Secondary | ICD-10-CM | POA: Diagnosis not present

## 2022-02-22 ENCOUNTER — Telehealth: Payer: Medicare PPO

## 2022-03-07 ENCOUNTER — Telehealth: Payer: Self-pay

## 2022-03-07 DIAGNOSIS — I10 Essential (primary) hypertension: Secondary | ICD-10-CM

## 2022-03-07 DIAGNOSIS — E1122 Type 2 diabetes mellitus with diabetic chronic kidney disease: Secondary | ICD-10-CM

## 2022-03-07 MED ORDER — CARVEDILOL 3.125 MG PO TABS: 3.1250 mg | ORAL_TABLET | Freq: Two times a day (BID) | ORAL | 0 refills | Status: DC

## 2022-03-07 MED ORDER — GABAPENTIN 100 MG PO CAPS: 100.0000 mg | ORAL_CAPSULE | Freq: Three times a day (TID) | ORAL | 0 refills | Status: DC

## 2022-03-07 NOTE — Telephone Encounter (Signed)
Medication sent to pharmacy. Pt does not have voicemail set up.  ?

## 2022-03-07 NOTE — Telephone Encounter (Signed)
Encourage patient to contact the pharmacy for refills or they can request refills through [facility] ? ?(Please schedule appointment if patient has not been seen in over a year) ? ? ? ?WHAT PHARMACY WOULD THEY LIKE THIS SENT TO: [facility], [place]  ?[address], [place] [address]  ? ?MEDICATION NAME & DOSE:gabapentin (NEURONTIN) 100 MG capsule , carvedilol (COREG) 3.125 MG tablet  ? ?NOTES/COMMENTS FROM PATIENT: ? ? ? ? ? ?[place] office please notify patient: ?It takes 48-72 hours to process rx refill requests ?Ask patient to call pharmacy to ensure rx is ready before heading there.  ? ?

## 2022-03-07 NOTE — Telephone Encounter (Signed)
Gabapentin last prescribed by Dr.Paseda 12/06/2021 #90 2 RF. Please advise. Thank you ?

## 2022-03-08 NOTE — Telephone Encounter (Signed)
Pt contacted and states she picked up her meds yesterday.  ?

## 2022-03-23 DIAGNOSIS — E119 Type 2 diabetes mellitus without complications: Secondary | ICD-10-CM | POA: Diagnosis not present

## 2022-04-04 ENCOUNTER — Telehealth: Payer: Self-pay | Admitting: Family Medicine

## 2022-04-04 ENCOUNTER — Other Ambulatory Visit: Payer: Self-pay | Admitting: *Deleted

## 2022-04-04 DIAGNOSIS — I10 Essential (primary) hypertension: Secondary | ICD-10-CM

## 2022-04-04 DIAGNOSIS — N184 Chronic kidney disease, stage 4 (severe): Secondary | ICD-10-CM

## 2022-04-04 MED ORDER — VITAMIN D (ERGOCALCIFEROL) 1.25 MG (50000 UNIT) PO CAPS: 50000.0000 [IU] | ORAL_CAPSULE | ORAL | 0 refills | Status: DC

## 2022-04-04 MED ORDER — GABAPENTIN 100 MG PO CAPS: 100.0000 mg | ORAL_CAPSULE | Freq: Three times a day (TID) | ORAL | 0 refills | Status: DC

## 2022-04-04 MED ORDER — CARVEDILOL 3.125 MG PO TABS: 3.1250 mg | ORAL_TABLET | Freq: Two times a day (BID) | ORAL | 0 refills | Status: DC

## 2022-04-04 NOTE — Telephone Encounter (Signed)
Patient is requesting refill gabapentin 100 mg, vitamin D2 25 mg capsules, Carvedilol 3.125 mg send to [facility] ?

## 2022-04-04 NOTE — Telephone Encounter (Signed)
Prescriptions sent in.. VM not set up ?

## 2022-04-04 NOTE — Telephone Encounter (Signed)
Pt returned call and verbalized understanding  

## 2022-04-11 ENCOUNTER — Ambulatory Visit (HOSPITAL_COMMUNITY)
Admission: RE | Admit: 2022-04-11 | Discharge: 2022-04-11 | Disposition: A | Discharge status: Home / Self Care | Payer: Medicare HMO | Source: Ambulatory Visit | Attending: Family Medicine | Admitting: Family Medicine

## 2022-04-11 ENCOUNTER — Ambulatory Visit (INDEPENDENT_AMBULATORY_CARE_PROVIDER_SITE_OTHER): Payer: Medicare HMO | Admitting: Family Medicine

## 2022-04-11 VITALS — BP 130/70 | HR 77 | Temp 98.6°F | Ht 64.0 in | Wt 178.4 lb

## 2022-04-11 DIAGNOSIS — M25562 Pain in left knee: Secondary | ICD-10-CM | POA: Insufficient documentation

## 2022-04-11 DIAGNOSIS — I5189 Other ill-defined heart diseases: Secondary | ICD-10-CM | POA: Insufficient documentation

## 2022-04-11 DIAGNOSIS — I358 Other nonrheumatic aortic valve disorders: Secondary | ICD-10-CM | POA: Insufficient documentation

## 2022-04-11 NOTE — Patient Instructions (Signed)
Xray at the hospital.  We will call with results.  I will refer to Ortho once xray returns.  Take care  Dr. Lacinda Axon

## 2022-04-11 NOTE — Progress Notes (Signed)
Subjective:  Patient ID: Deborah Gomez, female    DOB: 10/17/1946  Age: 76 y.o. MRN: [id number]  CC: Chief Complaint  Patient presents with   left knee pain    Gets stiff and tight at times and has pain.    HPI:  76 year old female with hypertension, aortic valve sclerosis, goiter, type 2 diabetes, CKD with nephrotic range proteinuria, hyperlipidemia presents with complaints of left knee pain.  Patient reports a 3-week history of left knee pain.  She reports that she has had associated swelling.  She states that her knee pain is currently improved.  However, it has still been bothering her slightly and this is concerning for her.  She has had prior total knee replacement several years ago.  This was done in [place].  No recent fall, trauma, injury.  No other associated symptoms.  No other complaints.  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   Anemia of chronic disease 01/09/2022   Goiter 01/09/2022   Positive colorectal cancer screening using Cologuard test 12/19/2021   HLD (hyperlipidemia) 08/16/2021   Vitamin D deficiency 06/06/2021   DMII (diabetes mellitus, type 2) ([place]) 05/16/2021   Hypertension 05/15/2021    Social Hx   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  Tobacco Use   Smoking status: Former    Packs/day: 0.50    Years: 52.00    Pack years: 26.00    Types: Cigarettes    Quit date: 08/21/2021    Years since quitting: 0.6   Smokeless tobacco: Never  Vaping Use   Vaping Use:  Never used  Substance and Sexual Activity   Alcohol use: Not Currently   Drug use: Never   Sexual activity: Yes    Birth control/protection: Post-menopausal  Other Topics Concern   Not on file  Social History Narrative   1 child in [place], the other is not nearby   Social Determinants of Radio broadcast assistant Strain: Low Risk    Difficulty of Paying Living Expenses: Not hard at all  Food Insecurity: No Food Insecurity   Worried About Charity fundraiser in the Last Year: Never true   Arboriculturist in the Last Year: Never true  Transportation Needs: No Transportation Needs   Lack of Transportation (Medical): No   Lack of Transportation (Non-Medical): No  Physical Activity: Insufficiently Active   Days of Exercise per Week: 3 days   Minutes of Exercise per Session: 40 min  Stress: No Stress Concern Present   Feeling of Stress : Not at all  Social Connections: Moderately Isolated   Frequency of Communication with Friends and Family: More than three times a week   Frequency of Social Gatherings with Friends and Family: More than three times a week   Attends Religious Services: 1 to 4 times per year   Active Member of [employer] or Organizations: No   Attends Archivist Meetings: Never   Marital Status: Widowed  Review of Systems Per HPI  Objective:  BP 130/70   Pulse 77   Temp 98.6 F (37 C) (Oral)   Ht 5\' 4"  (1.626 m)   Wt 178 lb 6.4 oz (80.9 kg)   SpO2 99%   BMI 30.62 kg/m      04/11/2022    2:13 PM 04/11/2022    1:52 PM 01/09/2022    9:46 AM  BP/Weight  Systolic BP 803 212 248  Diastolic BP 70 70 70  Wt. (Lbs)  178.4 173  BMI  30.62 kg/m2 29.7 kg/m2    Physical Exam Constitutional:      General: She is not in acute distress.    Appearance: Normal appearance. She is not ill-appearing.  HENT:     Head: Normocephalic and atraumatic.  Pulmonary:     Effort: Pulmonary effort is normal. No respiratory distress.  Musculoskeletal:      Comments: Left knee -scar from prior total knee replacement noted.  Mild crepitus with range of motion.  No discrete areas of tenderness.  No appreciable effusion on exam.  Neurological:     Mental Status: She is alert.  Psychiatric:        Mood and Affect: Mood normal.        Behavior: Behavior normal.    Lab Results  Component Value Date   WBC 6.4 11/14/2021   HGB 10.9 (L) 11/14/2021   HCT 35.1 11/14/2021   PLT 243 11/14/2021   GLUCOSE 142 (H) 11/14/2021   CHOL 177 11/14/2021   TRIG 81 11/14/2021   HDL 92 11/14/2021   LDLCALC 70 11/14/2021   ALT 14 11/14/2021   AST 19 11/14/2021   NA 141 11/14/2021   K 5.3 (H) 11/14/2021   CL 106 11/14/2021   CREATININE 1.93 (H) 11/14/2021   BUN 27 11/14/2021   CO2 22 11/14/2021   TSH 0.984 07/12/2021   HGBA1C 6.1 (H) 11/14/2021     Assessment & Plan:   Problem List Items Addressed This Visit       Other   Acute pain of left knee - Primary    X-ray obtained today for further ration.  X-ray independent reviewed by me.  Interpretation: Prior total knee replacement.  Hardware intact.  Mild effusion noted.  Patient was given result and advised rest and ice.  Referring to orthopedics.       Relevant Orders   DG Knee Complete 4 Views Left (Completed)   Ambulatory referral to [facility]

## 2022-04-11 NOTE — Assessment & Plan Note (Signed)
X-ray obtained today for further ration.  X-ray independent reviewed by me.  Interpretation: Prior total knee replacement.  Hardware intact.  Mild effusion noted.  Patient was given result and advised rest and ice.  Referring to orthopedics.

## 2022-04-12 ENCOUNTER — Encounter: Payer: Self-pay | Admitting: Orthopedic Surgery

## 2022-04-17 ENCOUNTER — Ambulatory Visit: Payer: Medicare HMO | Admitting: Orthopedic Surgery

## 2022-04-17 ENCOUNTER — Encounter: Payer: Self-pay | Admitting: Orthopedic Surgery

## 2022-04-17 ENCOUNTER — Ambulatory Visit (INDEPENDENT_AMBULATORY_CARE_PROVIDER_SITE_OTHER): Payer: Medicare HMO

## 2022-04-17 VITALS — BP 143/65 | HR 65 | Ht 64.0 in | Wt 180.4 lb

## 2022-04-17 DIAGNOSIS — Z96652 Presence of left artificial knee joint: Secondary | ICD-10-CM | POA: Diagnosis not present

## 2022-04-17 DIAGNOSIS — M25562 Pain in left knee: Secondary | ICD-10-CM | POA: Diagnosis not present

## 2022-04-18 ENCOUNTER — Encounter: Payer: Self-pay | Admitting: Orthopedic Surgery

## 2022-04-18 NOTE — Progress Notes (Signed)
New Patient Visit  Assessment: Deborah Gomez is a 76 y.o. female with the following: 1. Acute pain of left knee  Plan: Meggan Dhaliwal has a history of left total knee arthroplasty, completed 8 years ago in [place].  She had no issues in her recovery.  Just a few weeks ago, she noted some pain on the anterior aspect of the left knee.  Since then, has improved.  No concerns for infection at this time.  Radiographs are reassuring.  Nothing needed at this time.  We discussed warning signs for an infection.  If she has further issues, she will contact the clinic.  I also let her know that we would have to refer her to someone who is capable of feeding a revision procedure, should this be a consideration.  Follow-up: Return if symptoms worsen or fail to improve.  Subjective:  Chief Complaint  Patient presents with   New Patient (Initial Visit)   Knee Pain    LT/started hurting ~3 weeks ago Hx of TKR LEFT in 2015 by Dr. Joannie Springs    History of Present Illness: Deborah Gomez is a 76 y.o. female who has been referred by  Thersa Salt, DO for evaluation of knee pain.  Left total knee arthroplasty completed by Dr. Joannie Springs in 2015, in [place].  She recovered well.  No issues with her incision or with therapy.  She had been doing well until about 3 weeks ago.  At that point, she noticed some pain and burning type sensations over the anterior knee.  She presented to her primary care doctor for evaluation.  Since then, the pain has improved.  She use a cane to assist with ambulation, but this is for additional stability, not due to pain.  She has not noticed any swelling in her left knee.  No drainage.  She has not had any fevers or chills.   Review of Systems: No fevers or chills No numbness or tingling No chest pain No shortness of breath No bowel or bladder dysfunction No GI distress No headaches   Medical History:  Past Medical History:  Diagnosis Date   Chronic kidney disease     Diabetes mellitus without complication ([place])    Hypertension     Past Surgical History:  Procedure Laterality Date   APPENDECTOMY     BREAST SURGERY Left    CARPAL TUNNEL RELEASE Right    REPLACEMENT TOTAL KNEE     THYROID SURGERY      Family History  Problem Relation Age of Onset   Colon cancer Neg Hx    Colon polyps Neg Hx    Social History   Tobacco Use   Smoking status: Former    Packs/day: 0.50    Years: 52.00    Pack years: 26.00    Types: Cigarettes    Quit date: 08/21/2021    Years since quitting: 0.6   Smokeless tobacco: Never  Vaping Use   Vaping Use: Never used  Substance Use Topics   Alcohol use: Not Currently   Drug use: Never    No Known Allergies  Current Meds  Medication Sig   aspirin EC 81 MG tablet Take 81 mg by mouth daily. Swallow whole.   carvedilol (COREG) 3.125 MG tablet Take 1 tablet (3.125 mg total) by mouth 2 (two) times daily with a meal.   empagliflozin (JARDIANCE) 10 MG TABS tablet Take 1 tablet (10 mg total) by mouth daily.   gabapentin (NEURONTIN) 100 MG capsule Take 1 capsule (100  mg total) by mouth 3 (three) times daily.   hydrALAZINE (APRESOLINE) 50 MG tablet Take 1.5 tablets (75 mg total) by mouth 3 (three) times daily.   losartan (COZAAR) 50 MG tablet Take 50 mg by mouth daily.   NIFEdipine (PROCARDIA XL/NIFEDICAL-XL) 90 MG 24 hr tablet Take 90 mg by mouth daily.   rosuvastatin (CRESTOR) 40 MG tablet Take 1 tablet (40 mg total) by mouth daily.   Vitamin D, Ergocalciferol, (DRISDOL) 1.25 MG (50000 UNIT) CAPS capsule Take 1 capsule (50,000 Units total) by mouth every 7 (seven) days.    Objective: BP (!) 143/65   Pulse 65   Ht 5\' 4"  (1.626 m)   Wt 180 lb 6.4 oz (81.8 kg)   BMI 30.97 kg/m   Physical Exam:  General: Alert and oriented. and No acute distress. Gait: Ambulates with the assistance of a cane.  Left knee anterior surgical incision is healed.  No surrounding erythema or drainage.  No tenderness to palpation about  the knee.  No effusion.  No redness.  She has full and painless range of motion.    IMAGING: I personally ordered and reviewed the following images  X-rays of the left knee were obtained in clinic today.  She has a well-positioned left total knee arthroplasty.  There is no lucency around the prosthesis.  No subsidence of the implants.  No evidence of osteolysis.  There is no x-ray available for comparison, but there does not appear to be any evidence of the implant shifting.  Impression: Left total knee arthroplasty without evidence of subsidence   New Medications:  No orders of the defined types were placed in this encounter.     Mordecai Rasmussen, MD  04/18/2022 9:23 AM

## 2022-04-26 DIAGNOSIS — R808 Other proteinuria: Secondary | ICD-10-CM | POA: Diagnosis not present

## 2022-04-26 DIAGNOSIS — N189 Chronic kidney disease, unspecified: Secondary | ICD-10-CM | POA: Diagnosis not present

## 2022-04-26 DIAGNOSIS — E1122 Type 2 diabetes mellitus with diabetic chronic kidney disease: Secondary | ICD-10-CM | POA: Diagnosis not present

## 2022-04-26 DIAGNOSIS — I5033 Acute on chronic diastolic (congestive) heart failure: Secondary | ICD-10-CM | POA: Diagnosis not present

## 2022-04-26 DIAGNOSIS — E559 Vitamin D deficiency, unspecified: Secondary | ICD-10-CM | POA: Diagnosis not present

## 2022-04-26 DIAGNOSIS — E211 Secondary hyperparathyroidism, not elsewhere classified: Secondary | ICD-10-CM | POA: Diagnosis not present

## 2022-04-26 DIAGNOSIS — I350 Nonrheumatic aortic (valve) stenosis: Secondary | ICD-10-CM | POA: Diagnosis not present

## 2022-04-26 DIAGNOSIS — D638 Anemia in other chronic diseases classified elsewhere: Secondary | ICD-10-CM | POA: Diagnosis not present

## 2022-05-02 DIAGNOSIS — I5032 Chronic diastolic (congestive) heart failure: Secondary | ICD-10-CM | POA: Diagnosis not present

## 2022-05-02 DIAGNOSIS — D638 Anemia in other chronic diseases classified elsewhere: Secondary | ICD-10-CM | POA: Diagnosis not present

## 2022-05-02 DIAGNOSIS — I129 Hypertensive chronic kidney disease with stage 1 through stage 4 chronic kidney disease, or unspecified chronic kidney disease: Secondary | ICD-10-CM | POA: Diagnosis not present

## 2022-05-02 DIAGNOSIS — E211 Secondary hyperparathyroidism, not elsewhere classified: Secondary | ICD-10-CM | POA: Diagnosis not present

## 2022-05-02 DIAGNOSIS — E1122 Type 2 diabetes mellitus with diabetic chronic kidney disease: Secondary | ICD-10-CM | POA: Diagnosis not present

## 2022-05-02 DIAGNOSIS — R808 Other proteinuria: Secondary | ICD-10-CM | POA: Diagnosis not present

## 2022-05-02 DIAGNOSIS — N189 Chronic kidney disease, unspecified: Secondary | ICD-10-CM | POA: Diagnosis not present

## 2022-05-03 ENCOUNTER — Other Ambulatory Visit: Payer: Self-pay | Admitting: *Deleted

## 2022-05-03 DIAGNOSIS — I10 Essential (primary) hypertension: Secondary | ICD-10-CM

## 2022-05-03 DIAGNOSIS — E1122 Type 2 diabetes mellitus with diabetic chronic kidney disease: Secondary | ICD-10-CM

## 2022-05-03 MED ORDER — CARVEDILOL 3.125 MG PO TABS: 3.1250 mg | ORAL_TABLET | Freq: Two times a day (BID) | ORAL | 0 refills | Status: DC

## 2022-05-03 MED ORDER — GABAPENTIN 100 MG PO CAPS: 100.0000 mg | ORAL_CAPSULE | Freq: Three times a day (TID) | ORAL | 0 refills | Status: DC

## 2022-05-23 ENCOUNTER — Other Ambulatory Visit: Payer: Self-pay | Admitting: *Deleted

## 2022-05-23 DIAGNOSIS — E785 Hyperlipidemia, unspecified: Secondary | ICD-10-CM

## 2022-05-23 MED ORDER — ROSUVASTATIN CALCIUM 40 MG PO TABS: 40.0000 mg | ORAL_TABLET | Freq: Every day | ORAL | 0 refills | Status: DC

## 2022-06-06 ENCOUNTER — Other Ambulatory Visit: Payer: Self-pay

## 2022-06-06 DIAGNOSIS — E1122 Type 2 diabetes mellitus with diabetic chronic kidney disease: Secondary | ICD-10-CM

## 2022-06-06 DIAGNOSIS — I10 Essential (primary) hypertension: Secondary | ICD-10-CM

## 2022-06-06 MED ORDER — GABAPENTIN 100 MG PO CAPS: 100.0000 mg | ORAL_CAPSULE | Freq: Three times a day (TID) | ORAL | 0 refills | Status: DC

## 2022-06-06 MED ORDER — CARVEDILOL 3.125 MG PO TABS: 3.1250 mg | ORAL_TABLET | Freq: Two times a day (BID) | ORAL | 0 refills | Status: DC

## 2022-06-07 ENCOUNTER — Ambulatory Visit: Payer: Medicare HMO

## 2022-06-25 DIAGNOSIS — H01002 Unspecified blepharitis right lower eyelid: Secondary | ICD-10-CM | POA: Diagnosis not present

## 2022-06-25 DIAGNOSIS — E119 Type 2 diabetes mellitus without complications: Secondary | ICD-10-CM | POA: Diagnosis not present

## 2022-06-25 DIAGNOSIS — H40013 Open angle with borderline findings, low risk, bilateral: Secondary | ICD-10-CM | POA: Diagnosis not present

## 2022-06-25 DIAGNOSIS — H25813 Combined forms of age-related cataract, bilateral: Secondary | ICD-10-CM | POA: Diagnosis not present

## 2022-06-25 DIAGNOSIS — H01001 Unspecified blepharitis right upper eyelid: Secondary | ICD-10-CM | POA: Diagnosis not present

## 2022-06-26 ENCOUNTER — Other Ambulatory Visit: Payer: Self-pay

## 2022-06-28 ENCOUNTER — Telehealth: Payer: Self-pay

## 2022-06-28 NOTE — Telephone Encounter (Signed)
Tried calling patient , no voicemail available.

## 2022-06-28 NOTE — Telephone Encounter (Signed)
Patient is requesting something else in place of jardiance 10 mg due to cost $95 for 30 pills

## 2022-06-29 ENCOUNTER — Other Ambulatory Visit: Payer: Self-pay | Admitting: Family Medicine

## 2022-06-29 DIAGNOSIS — N184 Chronic kidney disease, stage 4 (severe): Secondary | ICD-10-CM

## 2022-06-29 MED ORDER — EMPAGLIFLOZIN 10 MG PO TABS: 10.0000 mg | ORAL_TABLET | Freq: Every day | ORAL | 3 refills | Status: DC

## 2022-07-03 ENCOUNTER — Other Ambulatory Visit: Payer: Self-pay

## 2022-07-03 DIAGNOSIS — D638 Anemia in other chronic diseases classified elsewhere: Secondary | ICD-10-CM | POA: Diagnosis not present

## 2022-07-03 DIAGNOSIS — I129 Hypertensive chronic kidney disease with stage 1 through stage 4 chronic kidney disease, or unspecified chronic kidney disease: Secondary | ICD-10-CM | POA: Diagnosis not present

## 2022-07-03 DIAGNOSIS — N189 Chronic kidney disease, unspecified: Secondary | ICD-10-CM | POA: Diagnosis not present

## 2022-07-03 DIAGNOSIS — E1122 Type 2 diabetes mellitus with diabetic chronic kidney disease: Secondary | ICD-10-CM

## 2022-07-03 DIAGNOSIS — I5032 Chronic diastolic (congestive) heart failure: Secondary | ICD-10-CM | POA: Diagnosis not present

## 2022-07-03 DIAGNOSIS — E211 Secondary hyperparathyroidism, not elsewhere classified: Secondary | ICD-10-CM | POA: Diagnosis not present

## 2022-07-03 DIAGNOSIS — R808 Other proteinuria: Secondary | ICD-10-CM | POA: Diagnosis not present

## 2022-07-03 MED ORDER — EMPAGLIFLOZIN 10 MG PO TABS: 10.0000 mg | ORAL_TABLET | Freq: Every day | ORAL | 3 refills | Status: DC

## 2022-07-03 NOTE — Telephone Encounter (Addendum)
Tried calling patient to inform per drs notes , Deborah Gomez has been resent to the pharmacy, [name] is not on the formulary.

## 2022-07-04 NOTE — Telephone Encounter (Signed)
Pt returned call. Pt states has already picked up the medication.

## 2022-07-09 ENCOUNTER — Ambulatory Visit (INDEPENDENT_AMBULATORY_CARE_PROVIDER_SITE_OTHER): Payer: Medicare HMO | Admitting: Family Medicine

## 2022-07-09 ENCOUNTER — Encounter: Payer: Self-pay | Admitting: Family Medicine

## 2022-07-09 DIAGNOSIS — E049 Nontoxic goiter, unspecified: Secondary | ICD-10-CM

## 2022-07-09 DIAGNOSIS — E782 Mixed hyperlipidemia: Secondary | ICD-10-CM

## 2022-07-09 DIAGNOSIS — I10 Essential (primary) hypertension: Secondary | ICD-10-CM | POA: Diagnosis not present

## 2022-07-09 DIAGNOSIS — N184 Chronic kidney disease, stage 4 (severe): Secondary | ICD-10-CM

## 2022-07-09 DIAGNOSIS — E1122 Type 2 diabetes mellitus with diabetic chronic kidney disease: Secondary | ICD-10-CM | POA: Diagnosis not present

## 2022-07-09 MED ORDER — GABAPENTIN 100 MG PO CAPS: 100.0000 mg | ORAL_CAPSULE | Freq: Three times a day (TID) | ORAL | 3 refills | Status: DC

## 2022-07-09 NOTE — Assessment & Plan Note (Signed)
Well controlled. Continue Crestor. 

## 2022-07-09 NOTE — Assessment & Plan Note (Signed)
Well controlled 

## 2022-07-09 NOTE — Patient Instructions (Signed)
Follow up in 6 months.  Call with concerns.  Take care  Dr. Noura Purpura  

## 2022-07-09 NOTE — Assessment & Plan Note (Signed)
Fair control given age. Continue current medications. Continue close follow up with nephrology.

## 2022-07-09 NOTE — Progress Notes (Signed)
Subjective:  Patient ID: Deborah Gomez, female    DOB: 1946-11-01  Age: 76 y.o. MRN: [id number]  CC: Chief Complaint  Patient presents with   Diabetes    Pt not checking sugars. Pt was told to hold Vit D. No issues at this time.     HPI:  76 year old female with aortic sclerosis, HTN, DM-2, Goiter, CKD, Anemia of Chronic Disease, HLD presents for follow up.  Patient states that she is doing well. Has upcoming cataract surgery.  Follows closely with Nephrology regarding CKD.  DM-2 has been well controlled.  She is currently taking Jardiance (this is more for CKD than her DM-2).   BP mildly elevated today. Fair control given age. She is on Losartan, Coreg, Procardia, and Hydralazine.   Lipids have been well controlled on Crestor. She is tolerating well.   Patient Active Problem List   Diagnosis Date Noted   Aortic valve sclerosis 22/48/2500   Diastolic dysfunction 37/02/8888   CKD (chronic kidney disease) stage 4, GFR 15-29 ml/min (HCC) 01/09/2022   Nephrotic range proteinuria 01/09/2022   Anemia of chronic disease 01/09/2022   Goiter 01/09/2022   Positive colorectal cancer screening using Cologuard test 12/19/2021   HLD (hyperlipidemia) 08/16/2021   Vitamin D deficiency 06/06/2021   DMII (diabetes mellitus, type 2) ([place]) 05/16/2021   Hypertension 05/15/2021    Social Hx   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  Tobacco Use   Smoking status: Former    Packs/day: 0.50    Years: 52.00    Total pack years: 26.00    Types: Cigarettes    Quit date: 08/21/2021    Years since quitting: 0.8   Smokeless tobacco: Never  Vaping Use   Vaping Use: Never used  Substance and Sexual Activity   Alcohol use: Not Currently    Drug use: Never   Sexual activity: Yes    Birth control/protection: Post-menopausal  Other Topics Concern   Not on file  Social History Narrative   1 child in [place], the other is not nearby   Social Determinants of Health   Financial Resource Strain: [place]  (06/06/2021)   Overall Financial Resource Strain (CARDIA)    Difficulty of Paying Living Expenses: Not hard at all  Food Insecurity: No Food Insecurity (06/06/2021)   Hunger Vital Sign    Worried About Running Out of Food in the Last Year: Never true    [place] in the Last Year: Never true  Transportation Needs: No Transportation Needs (06/06/2021)   PRAPARE - Hydrologist (Medical): No    Lack of Transportation (Non-Medical): No  Physical Activity: Insufficiently Active (06/06/2021)   Exercise Vital Sign    Days of Exercise per Week: 3 days    Minutes of Exercise per Session: 40 min  Stress: No Stress Concern Present (06/06/2021)   [place]    Feeling of Stress : Not at all  Social Connections: Moderately Isolated (06/06/2021)   Social Connection and Isolation Panel [NHANES]    Frequency of Communication with Friends and Family: More than three times a week    Frequency  of Social Gatherings with Friends and Family: More than three times a week    Attends Religious Services: 1 to 4 times per year    Active Member of [employer] or Organizations: No    Attends Archivist Meetings: Never    Marital Status: Widowed    Review of Systems  Constitutional: Negative.   Respiratory: Negative.    Cardiovascular: Negative.   Musculoskeletal:        Knee pain.   Objective:  BP (!) 150/66   Pulse 72   Temp 97.8 F (36.6 C)   Wt 170 lb 9.6 oz (77.4 kg)   SpO2 100%   BMI 29.28 kg/m      07/09/2022    9:58 AM 04/17/2022    2:44 PM 04/11/2022    2:13 PM  BP/Weight  Systolic BP 169 678 938  Diastolic BP 66 65 70   Wt. (Lbs) 170.6 180.4   BMI 29.28 kg/m2 30.97 kg/m2     Physical Exam Constitutional:      General: She is not in acute distress.    Appearance: Normal appearance.  HENT:     Head: Normocephalic and atraumatic.  Eyes:     General:        Right eye: No discharge.        Left eye: No discharge.     Conjunctiva/sclera: Conjunctivae normal.  Neck:     Comments: Goiter noted (left); has had prior partial thyroidectomy.  Cardiovascular:     Rate and Rhythm: Normal rate and regular rhythm.     Heart sounds: Murmur heard.  Pulmonary:     Effort: Pulmonary effort is normal.     Breath sounds: Normal breath sounds. No wheezing, rhonchi or rales.  Neurological:     Mental Status: She is alert.  Psychiatric:        Mood and Affect: Mood normal.        Behavior: Behavior normal.     Lab Results  Component Value Date   WBC 6.4 11/14/2021   HGB 10.9 (L) 11/14/2021   HCT 35.1 11/14/2021   PLT 243 11/14/2021   GLUCOSE 142 (H) 11/14/2021   CHOL 177 11/14/2021   TRIG 81 11/14/2021   HDL 92 11/14/2021   LDLCALC 70 11/14/2021   ALT 14 11/14/2021   AST 19 11/14/2021   NA 141 11/14/2021   K 5.3 (H) 11/14/2021   CL 106 11/14/2021   CREATININE 1.93 (H) 11/14/2021   BUN 27 11/14/2021   CO2 22 11/14/2021   TSH 0.984 07/12/2021   HGBA1C 6.1 (H) 11/14/2021     Assessment & Plan:   Problem List Items Addressed This Visit       Cardiovascular and Mediastinum   Hypertension    Fair control given age. Continue current medications. Continue close follow up with nephrology.        Endocrine   DMII (diabetes mellitus, type 2) ([place])    Well controlled.       Relevant Medications   gabapentin (NEURONTIN) 100 MG capsule   Goiter    TSH/T4 at follow up. Consider [place].        Genitourinary   CKD (chronic kidney disease) stage 4, GFR 15-29 ml/min (HCC)    Continue Jardiance.        Other   HLD (hyperlipidemia)    Well controlled. Continue Crestor.        Meds ordered  this encounter  Medications   gabapentin (NEURONTIN) 100 MG capsule  Sig: Take 1 capsule (100 mg total) by mouth 3 (three) times daily.    Dispense:  90 capsule    Refill:  3    Follow-up:  Return in about 6 months (around 01/09/2023).  Vander

## 2022-07-09 NOTE — Assessment & Plan Note (Signed)
TSH/T4 at follow up. Consider [place].

## 2022-07-09 NOTE — Assessment & Plan Note (Signed)
Continue Jardiance

## 2022-07-12 DIAGNOSIS — I129 Hypertensive chronic kidney disease with stage 1 through stage 4 chronic kidney disease, or unspecified chronic kidney disease: Secondary | ICD-10-CM | POA: Diagnosis not present

## 2022-07-12 DIAGNOSIS — I5032 Chronic diastolic (congestive) heart failure: Secondary | ICD-10-CM | POA: Diagnosis not present

## 2022-07-12 DIAGNOSIS — E1122 Type 2 diabetes mellitus with diabetic chronic kidney disease: Secondary | ICD-10-CM | POA: Diagnosis not present

## 2022-07-12 DIAGNOSIS — R808 Other proteinuria: Secondary | ICD-10-CM | POA: Diagnosis not present

## 2022-07-12 DIAGNOSIS — E211 Secondary hyperparathyroidism, not elsewhere classified: Secondary | ICD-10-CM | POA: Diagnosis not present

## 2022-07-12 DIAGNOSIS — N189 Chronic kidney disease, unspecified: Secondary | ICD-10-CM | POA: Diagnosis not present

## 2022-07-12 DIAGNOSIS — D638 Anemia in other chronic diseases classified elsewhere: Secondary | ICD-10-CM | POA: Diagnosis not present

## 2022-07-24 ENCOUNTER — Encounter (HOSPITAL_COMMUNITY)
Admission: RE | Admit: 2022-07-24 | Discharge: 2022-07-24 | Disposition: A | Discharge status: Home / Self Care | Payer: Medicare HMO | Source: Ambulatory Visit | Attending: Ophthalmology | Admitting: Ophthalmology

## 2022-07-24 ENCOUNTER — Other Ambulatory Visit: Payer: Self-pay

## 2022-07-24 ENCOUNTER — Encounter (HOSPITAL_COMMUNITY): Payer: Self-pay

## 2022-07-26 DIAGNOSIS — H25811 Combined forms of age-related cataract, right eye: Secondary | ICD-10-CM | POA: Diagnosis not present

## 2022-07-27 NOTE — H&P (Signed)
Surgical History & Physical  Patient Name: Deborah Gomez DOB: 1946-08-29  Surgery: Cataract extraction with intraocular lens implant phacoemulsification; Right Eye  Surgeon: Baruch Goldmann MD Surgery Date:  07-30-22 Pre-Op Date:  07-19-22  HPI: A 82 Yr. old female patient is referred by Dr Tempie Donning for cataract eval. 1. The patient complains of nighttime light - car headlights, street lamps etc. glare causing poor vision, which began 6 months ago. Both eyes are affected. The episode is gradual. Symptoms occur when the patient is driving and outside. This is negatively affecting the patient's quality of life and the patient is unable to function adequately in life with the current level of vision. HPI was performed by Baruch Goldmann .  Medical History: Diabetes - DM Type 2 Heart Problem Kidney Dis.  Review of Systems Negative Allergic/Immunologic Negative Cardiovascular Negative Constitutional Negative Ear, Nose, Mouth & Throat Negative Endocrine Negative Eyes Negative Gastrointestinal Negative Genitourinary Negative Hemotologic/Lymphatic Negative Integumentary Negative Musculoskeletal Negative Neurological Negative Psychiatry Negative Respiratory  Social   Current every day smoker   Medication Gabapentin, Jardiance, Vitamin D2, Carvedilol, Losartan, Furosemide, Aspirin, Hydralazine, Rosuvastatin, Nifedipine,   Sx/Procedures  Appendectomy, Knee Replacement, Thyroid sx, Breast Sx x2,   Drug Allergies   NKDA  History & Physical: Heent: Cataract, right eye NECK: supple without bruits LUNGS: lungs clear to auscultation CV: regular rate and rhythm Abdomen: soft and non-tender Impression & Plan: Assessment: 1.  COMBINED FORMS AGE RELATED CATARACT; Both Eyes (H25.813) 2.  Diabetes Type 2 No retinopathy (E11.9) 3.  BLEPHARITIS; Right Upper Lid, Right Lower Lid, Left Upper Lid, Left Lower Lid (H01.001, H01.002,H01.004,H01.005) 4.  DERMATOCHALASIS, no surgery; Right  Upper Lid, Left Upper Lid (H02.831, H02.834) 5.  ARCUS SENILIS; Both Eyes (H18.413) 6.  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  OAG BORDERLINE FINDINGS LOW RISK; Both Eyes (H40.013)  Plan: 1.  Cataract accounts for the patient's decreased vision. This visual impairment is not correctable with a tolerable change in glasses or contact lenses. Cataract surgery with an implantation of a new lens should significantly improve the visual and functional status of the patient. Discussed all risks, benefits, alternatives, and potential complications. Discussed the procedures and recovery. Patient desires to have surgery. A-scan ordered and performed today for intra-ocular lens calculations. The surgery will be performed in order to improve vision for driving, reading, and for eye examinations. Recommend phacoemulsification with intra-ocular lens. Recommend Dextenza for post-operative pain and inflammation. Right Eye non-dominant - first.. Dilates well - shugarcaine by protocol.  2.  Stressed importance of blood sugar and blood pressure control, and also yearly eye examinations. Discussed the need for ongoing proactive ocular exams and treatment, hopefully before visual symptoms develop.  3.  Recommend regular lid cleaning.  4.  Asymptomatic, recommend observation for now. Findings, prognosis and treatment options reviewed.  5.  Discussed significance of finding Answered patient questions about finding  6.  Observe; Artificial tears as needed for irritation.  7.  Based on cup-to-disc ratio. Unknown Family history. OCT rNFL shows: WNL OU. IOPs high normal. Recommend monitoring IOPs. Detailed discussion about glaucoma today including importance of maintaining good follow up and following treatment plan, and the possibility of irreversible blindness as part of this disease process.

## 2022-07-30 ENCOUNTER — Ambulatory Visit (HOSPITAL_COMMUNITY): Payer: Medicare HMO | Admitting: Anesthesiology

## 2022-07-30 ENCOUNTER — Encounter (HOSPITAL_COMMUNITY): Payer: Self-pay | Admitting: Ophthalmology

## 2022-07-30 ENCOUNTER — Other Ambulatory Visit: Payer: Self-pay

## 2022-07-30 ENCOUNTER — Encounter (HOSPITAL_COMMUNITY)
Admission: RE | Disposition: A | Discharge status: Home / Self Care | Payer: Self-pay | Source: Home / Self Care | Attending: Ophthalmology

## 2022-07-30 ENCOUNTER — Ambulatory Visit (HOSPITAL_BASED_OUTPATIENT_CLINIC_OR_DEPARTMENT_OTHER): Payer: Medicare HMO | Admitting: Anesthesiology

## 2022-07-30 ENCOUNTER — Ambulatory Visit (HOSPITAL_COMMUNITY)
Admission: RE | Admit: 2022-07-30 | Discharge: 2022-07-30 | Disposition: A | Discharge status: Home / Self Care | Payer: Medicare HMO | Attending: Ophthalmology | Admitting: Ophthalmology

## 2022-07-30 DIAGNOSIS — H25811 Combined forms of age-related cataract, right eye: Secondary | ICD-10-CM | POA: Diagnosis not present

## 2022-07-30 DIAGNOSIS — F17200 Nicotine dependence, unspecified, uncomplicated: Secondary | ICD-10-CM | POA: Diagnosis not present

## 2022-07-30 DIAGNOSIS — Z87891 Personal history of nicotine dependence: Secondary | ICD-10-CM | POA: Diagnosis not present

## 2022-07-30 DIAGNOSIS — D649 Anemia, unspecified: Secondary | ICD-10-CM | POA: Diagnosis not present

## 2022-07-30 DIAGNOSIS — E1136 Type 2 diabetes mellitus with diabetic cataract: Secondary | ICD-10-CM | POA: Insufficient documentation

## 2022-07-30 DIAGNOSIS — H25813 Combined forms of age-related cataract, bilateral: Secondary | ICD-10-CM | POA: Diagnosis not present

## 2022-07-30 DIAGNOSIS — H259 Unspecified age-related cataract: Secondary | ICD-10-CM

## 2022-07-30 DIAGNOSIS — H0100A Unspecified blepharitis right eye, upper and lower eyelids: Secondary | ICD-10-CM | POA: Insufficient documentation

## 2022-07-30 DIAGNOSIS — H02831 Dermatochalasis of right upper eyelid: Secondary | ICD-10-CM | POA: Diagnosis not present

## 2022-07-30 DIAGNOSIS — H02834 Dermatochalasis of left upper eyelid: Secondary | ICD-10-CM | POA: Insufficient documentation

## 2022-07-30 DIAGNOSIS — Z7984 Long term (current) use of oral hypoglycemic drugs: Secondary | ICD-10-CM | POA: Diagnosis not present

## 2022-07-30 DIAGNOSIS — H11153 Pinguecula, bilateral: Secondary | ICD-10-CM | POA: Insufficient documentation

## 2022-07-30 DIAGNOSIS — I10 Essential (primary) hypertension: Secondary | ICD-10-CM | POA: Diagnosis not present

## 2022-07-30 DIAGNOSIS — H0100B Unspecified blepharitis left eye, upper and lower eyelids: Secondary | ICD-10-CM | POA: Diagnosis not present

## 2022-07-30 DIAGNOSIS — H18413 Arcus senilis, bilateral: Secondary | ICD-10-CM | POA: Insufficient documentation

## 2022-07-30 HISTORY — PX: CATARACT EXTRACTION W/PHACO: SHX586

## 2022-07-30 LAB — GLUCOSE, CAPILLARY: Glucose-Capillary: 120 mg/dL — ABNORMAL HIGH (ref 70–99)

## 2022-07-30 SURGERY — PHACOEMULSIFICATION, CATARACT, WITH IOL INSERTION
Anesthesia: Monitor Anesthesia Care | Site: Eye | Laterality: Right

## 2022-07-30 MED ORDER — SODIUM CHLORIDE 0.9% FLUSH
INTRAVENOUS | Status: DC | PRN
  Administered 2022-07-30: 5 mL via INTRAVENOUS

## 2022-07-30 MED ORDER — MOXIFLOXACIN HCL 0.5 % OP SOLN
OPHTHALMIC | Status: DC | PRN
  Administered 2022-07-30: 1 [drp] via OPHTHALMIC

## 2022-07-30 MED ORDER — EPINEPHRINE PF 1 MG/ML IJ SOLN
INTRAMUSCULAR | Status: AC
  Filled 2022-07-30: qty 2

## 2022-07-30 MED ORDER — STERILE WATER FOR IRRIGATION IR SOLN
Status: DC | PRN
  Administered 2022-07-30: 500 mL

## 2022-07-30 MED ORDER — TETRACAINE HCL 0.5 % OP SOLN
1.0000 [drp] | OPHTHALMIC | Status: AC | PRN
  Administered 2022-07-30 (×3): 1 [drp] via OPHTHALMIC

## 2022-07-30 MED ORDER — MIDAZOLAM HCL 2 MG/2ML IJ SOLN
INTRAMUSCULAR | Status: DC | PRN
  Administered 2022-07-30: 2 mg via INTRAVENOUS

## 2022-07-30 MED ORDER — SODIUM HYALURONATE 23MG/ML IO SOSY
PREFILLED_SYRINGE | INTRAOCULAR | Status: DC | PRN
  Administered 2022-07-30: 0.6 mL via INTRAOCULAR

## 2022-07-30 MED ORDER — POVIDONE-IODINE 5 % OP SOLN
OPHTHALMIC | Status: DC | PRN
  Administered 2022-07-30: 1 via OPHTHALMIC

## 2022-07-30 MED ORDER — EPINEPHRINE PF 1 MG/ML IJ SOLN
INTRAOCULAR | Status: DC | PRN
  Administered 2022-07-30: 500 mL

## 2022-07-30 MED ORDER — LIDOCAINE HCL 3.5 % OP GEL
1.0000 | Freq: Once | OPHTHALMIC | Status: AC
  Administered 2022-07-30: 1 via OPHTHALMIC

## 2022-07-30 MED ORDER — SODIUM HYALURONATE 10 MG/ML IO SOLUTION
PREFILLED_SYRINGE | INTRAOCULAR | Status: DC | PRN
  Administered 2022-07-30: 0.85 mL via INTRAOCULAR

## 2022-07-30 MED ORDER — LIDOCAINE HCL (PF) 1 % IJ SOLN
INTRAOCULAR | Status: DC | PRN
  Administered 2022-07-30: 1 mL via OPHTHALMIC

## 2022-07-30 MED ORDER — PHENYLEPHRINE HCL 2.5 % OP SOLN
1.0000 [drp] | OPHTHALMIC | Status: AC | PRN
  Administered 2022-07-30 (×3): 1 [drp] via OPHTHALMIC

## 2022-07-30 MED ORDER — TROPICAMIDE 1 % OP SOLN
1.0000 [drp] | OPHTHALMIC | Status: AC | PRN
Start: 2022-07-30 — End: 2022-07-30
  Administered 2022-07-30 (×3): 1 [drp] via OPHTHALMIC

## 2022-07-30 MED ORDER — MIDAZOLAM HCL 2 MG/2ML IJ SOLN
INTRAMUSCULAR | Status: AC
  Filled 2022-07-30: qty 2

## 2022-07-30 MED ORDER — BSS IO SOLN
INTRAOCULAR | Status: DC | PRN
  Administered 2022-07-30: 15 mL via INTRAOCULAR

## 2022-07-30 SURGICAL SUPPLY — 16 items
CATARACT SUITE SIGHTPATH (MISCELLANEOUS) ×1 IMPLANT
CLOTH BEACON ORANGE TIMEOUT ST (SAFETY) ×1 IMPLANT
DRAPE HALF SHEET 40X57 (DRAPES) IMPLANT
EYE SHIELD UNIVERSAL CLEAR (GAUZE/BANDAGES/DRESSINGS) IMPLANT
FEE CATARACT SUITE SIGHTPATH (MISCELLANEOUS) ×1 IMPLANT
GLOVE BIOGEL PI IND STRL 7.0 (GLOVE) ×2 IMPLANT
LENS IOL RAYNER 24.0 (Intraocular Lens) ×1 IMPLANT
LENS IOL RAYONE EMV 24.0 (Intraocular Lens) IMPLANT
NDL HYPO 18GX1.5 BLUNT FILL (NEEDLE) ×1 IMPLANT
NEEDLE HYPO 18GX1.5 BLUNT FILL (NEEDLE) ×1 IMPLANT
PAD ARMBOARD 7.5X6 YLW CONV (MISCELLANEOUS) ×1 IMPLANT
RING MALYGIN 7.0 (MISCELLANEOUS) IMPLANT
SYR TB 1ML LL NO SAFETY (SYRINGE) ×1 IMPLANT
TAPE SURG TRANSPORE 1 IN (GAUZE/BANDAGES/DRESSINGS) IMPLANT
TAPE SURGICAL TRANSPORE 1 IN (GAUZE/BANDAGES/DRESSINGS) ×1
WATER STERILE IRR 500ML POUR (IV SOLUTION) IMPLANT

## 2022-07-30 NOTE — Interval H&P Note (Signed)
History and Physical Interval Note:  07/30/2022 8:57 AM  Deborah Gomez  has presented today for surgery, with the diagnosis of Combined forms age related cataract; right.  The various methods of treatment have been discussed with the patient and family. After consideration of risks, benefits and other options for treatment, the patient has consented to  Procedure(s) with comments: CATARACT EXTRACTION PHACO AND INTRAOCULAR LENS PLACEMENT (IOC) (Right) - CDE: as a surgical intervention.  The patient's history has been reviewed, patient examined, no change in status, stable for surgery.  I have reviewed the patient's chart and labs.  Questions were answered to the patient's satisfaction.     Baruch Goldmann

## 2022-07-30 NOTE — Discharge Instructions (Signed)
Please discharge patient when stable, will follow up today with Dr. Maryelizabeth Eberle at the [facility] office immediately following discharge.  Leave shield in place until visit.  All paperwork with discharge instructions will be given at the office.  [facility] Address:  [address]  [place], [place] [address]  

## 2022-07-30 NOTE — Anesthesia Postprocedure Evaluation (Signed)
Anesthesia Post Note  Patient: Deborah Gomez  Procedure(s) Performed: CATARACT EXTRACTION PHACO AND INTRAOCULAR LENS PLACEMENT (IOC) (Right: Eye)  Patient location during evaluation: Phase II Anesthesia Type: MAC Level of consciousness: awake and alert and oriented Pain management: pain level controlled Vital Signs Assessment: post-procedure vital signs reviewed and stable Respiratory status: spontaneous breathing, nonlabored ventilation and respiratory function stable Cardiovascular status: blood pressure returned to baseline and stable Postop Assessment: no apparent nausea or vomiting Anesthetic complications: no   No notable events documented.   Last Vitals:  Vitals:   07/30/22 0854 07/30/22 0924  BP: 127/61 (!) 159/81  Pulse: 72 74  Resp: 14 14  Temp: 36.6 C 36.7 C  SpO2: 100% 98%    Last Pain:  Vitals:   07/30/22 0924  TempSrc: Oral  PainSc: 0-No pain                 Yzabelle Calles C Treanna Dumler

## 2022-07-30 NOTE — Anesthesia Preprocedure Evaluation (Signed)
Anesthesia Evaluation  Patient identified by MRN, date of birth, ID band Patient awake    Reviewed: Allergy & Precautions, NPO status , Patient's Chart, lab work & pertinent test results, reviewed documented beta blocker date and time   Airway Mallampati: II  TM Distance: >3 FB Neck ROM: Full    Dental  (+) Edentulous Upper, Edentulous Lower   Pulmonary neg pulmonary ROS, former smoker,    Pulmonary exam normal breath sounds clear to auscultation       Cardiovascular Exercise Tolerance: Good hypertension, Pt. on medications and Pt. on home beta blockers Normal cardiovascular exam Rhythm:Regular Rate:Normal     Neuro/Psych negative neurological ROS  negative psych ROS   GI/Hepatic negative GI ROS, Neg liver ROS,   Endo/Other  diabetes, Well Controlled, Type 2, Oral Hypoglycemic Agents  Renal/GU Renal InsufficiencyRenal disease  negative genitourinary   Musculoskeletal negative musculoskeletal ROS (+)   Abdominal   Peds negative pediatric ROS (+)  Hematology  (+) Blood dyscrasia, anemia ,   Anesthesia Other Findings   Reproductive/Obstetrics negative OB ROS                             Anesthesia Physical Anesthesia Plan  ASA: 2  Anesthesia Plan: MAC   Post-op Pain Management: Minimal or no pain anticipated   Induction:   PONV Risk Score and Plan:   Airway Management Planned: Natural Airway and Nasal Cannula  Additional Equipment:   Intra-op Plan:   Post-operative Plan:   Informed Consent: I have reviewed the patients History and Physical, chart, labs and discussed the procedure including the risks, benefits and alternatives for the proposed anesthesia with the patient or authorized representative who has indicated his/her understanding and acceptance.       Plan Discussed with: CRNA and Surgeon  Anesthesia Plan Comments:         Anesthesia Quick Evaluation

## 2022-07-30 NOTE — Op Note (Signed)
Date of procedure: 07/30/22  Pre-operative diagnosis:  Visually significant combined form age-related cataract, Right Eye (H25.811)  Post-operative diagnosis:  Visually significant combined form age-related cataract, Right Eye (H25.811)  Procedure: Removal of cataract via phacoemulsification and insertion of intra-ocular lens Rayner [id number] +24.0D into the capsular bag of the Right Eye  Attending surgeon: Gerda Diss. Hayley Horn, MD, MA  Anesthesia: MAC, Topical [place]  Complications: None  Estimated Blood Loss: <54m (minimal)  Specimens: None  Implants: As above  Indications:  Visually significant age-related cataract, Right Eye  Procedure:  The patient was seen and identified in the pre-operative area. The operative eye was identified and dilated.  The operative eye was marked.  Topical anesthesia was administered to the operative eye.     The patient was then to the operative suite and placed in the supine position.  A timeout was performed confirming the patient, procedure to be performed, and all other relevant information.   The patient's face was prepped and draped in the usual fashion for intra-ocular surgery.  A lid speculum was placed into the operative eye and the surgical microscope moved into place and focused.  A superotemporal paracentesis was created using a 20 gauge paracentesis blade.  Shugarcaine was injected into the anterior chamber.  Viscoelastic was injected into the anterior chamber.  A temporal clear-corneal main wound incision was created using a 2.486mmicrokeratome.  A continuous curvilinear capsulorrhexis was initiated using an irrigating cystitome and completed using capsulorrhexis forceps.  Hydrodissection and hydrodeliniation were performed.  Viscoelastic was injected into the anterior chamber.  A phacoemulsification handpiece and a chopper as a second instrument were used to remove the nucleus and epinucleus. The irrigation/aspiration handpiece was used to remove any  remaining cortical material.   The capsular bag was reinflated with viscoelastic, checked, and found to be intact.  The intraocular lens was inserted into the capsular bag.  The irrigation/aspiration handpiece was used to remove any remaining viscoelastic.  The clear corneal wound and paracentesis wounds were then hydrated and checked with Weck-Cels to be watertight.  The lid-speculum was removed.  The drape was removed.  The patient's face was cleaned with a wet and dry 4x4.   Maxitrol was instilled in the eye. A clear shield was taped over the eye. The patient was taken to the post-operative care unit in good condition, having tolerated the procedure well.  Post-Op Instructions: [facility] and will receive all other orders and instructions.

## 2022-07-30 NOTE — Transfer of Care (Signed)
Immediate Anesthesia Transfer of Care Note  Patient: Deborah Gomez  Procedure(s) Performed: CATARACT EXTRACTION PHACO AND INTRAOCULAR LENS PLACEMENT (IOC) (Right: Eye)  Patient Location: PACU  Anesthesia Type:MAC  Level of Consciousness: awake, alert , oriented and patient cooperative  Airway & Oxygen Therapy: Patient Spontanous Breathing  Post-op Assessment: Report given to RN, Post -op Vital signs reviewed and stable and Patient moving all extremities X 4  Post vital signs: Reviewed and stable  Last Vitals:  Vitals Value Taken Time  BP    Temp    Pulse    Resp    SpO2      Last Pain:  Vitals:   07/30/22 0854  TempSrc: Oral  PainSc: 0-No pain         Complications: No notable events documented.

## 2022-08-02 ENCOUNTER — Encounter (HOSPITAL_COMMUNITY): Payer: Self-pay | Admitting: Ophthalmology

## 2022-08-07 ENCOUNTER — Encounter (HOSPITAL_COMMUNITY): Admission: RE | Admit: 2022-08-07 | Payer: Medicare HMO | Source: Ambulatory Visit

## 2022-08-20 ENCOUNTER — Other Ambulatory Visit: Payer: Self-pay | Admitting: *Deleted

## 2022-08-20 DIAGNOSIS — H25812 Combined forms of age-related cataract, left eye: Secondary | ICD-10-CM | POA: Diagnosis not present

## 2022-08-20 DIAGNOSIS — E785 Hyperlipidemia, unspecified: Secondary | ICD-10-CM

## 2022-08-20 MED ORDER — ROSUVASTATIN CALCIUM 40 MG PO TABS: 40.0000 mg | ORAL_TABLET | Freq: Every day | ORAL | 0 refills | Status: DC

## 2022-08-21 ENCOUNTER — Encounter (HOSPITAL_COMMUNITY)
Admission: RE | Admit: 2022-08-21 | Discharge: 2022-08-21 | Disposition: A | Discharge status: Home / Self Care | Payer: Medicare HMO | Source: Ambulatory Visit | Attending: Ophthalmology | Admitting: Ophthalmology

## 2022-08-22 ENCOUNTER — Ambulatory Visit (INDEPENDENT_AMBULATORY_CARE_PROVIDER_SITE_OTHER): Payer: Medicare HMO

## 2022-08-22 VITALS — Ht 64.0 in | Wt 168.0 lb

## 2022-08-22 DIAGNOSIS — Z0000 Encounter for general adult medical examination without abnormal findings: Secondary | ICD-10-CM | POA: Diagnosis not present

## 2022-08-22 NOTE — Patient Instructions (Signed)
Deborah Gomez , Thank you for taking time to come for your Medicare Wellness Visit. I appreciate your ongoing commitment to your health goals. Please review the following plan we discussed and let me know if I can assist you in the future.   Screening recommendations/referrals: Colonoscopy: Cologuard 08/03/21, every 3 years Mammogram: aged out Bone Density: 08/23/21, every 5 years Recommended yearly ophthalmology/optometry visit for glaucoma screening and checkup Recommended yearly dental visit for hygiene and checkup  Vaccinations: Influenza vaccine: n/d Pneumococcal vaccine: n/d Tdap vaccine: n/d Shingles vaccine: n/d   Covid-19:n/d  Advanced directives: no  Conditions/risks identified: none  Next appointment: Follow up in one year for your annual wellness visit 08/27/23 @ 9:45 am by phone   Preventive Care 65 Years and Older, Female Preventive care refers to lifestyle choices and visits with your health care provider that can promote health and wellness. What does preventive care include? A yearly physical exam. This is also called an annual well check. Dental exams once or twice a year. Routine eye exams. Ask your health care provider how often you should have your eyes checked. Personal lifestyle choices, including: Daily care of your teeth and gums. Regular physical activity. Eating a healthy diet. Avoiding tobacco and drug use. Limiting alcohol use. Practicing safe sex. Taking low-dose aspirin every day. Taking vitamin and mineral supplements as recommended by your health care provider. What happens during an annual well check? The services and screenings done by your health care provider during your annual well check will depend on your age, overall health, lifestyle risk factors, and family history of disease. Counseling  Your health care provider may ask you questions about your: Alcohol use. Tobacco use. Drug use. Emotional well-being. Home and relationship  well-being. Sexual activity. Eating habits. History of falls. Memory and ability to understand (cognition). Work and work Statistician. Reproductive health. Screening  You may have the following tests or measurements: Height, weight, and BMI. Blood pressure. Lipid and cholesterol levels. These may be checked every 5 years, or more frequently if you are over 82 years old. Skin check. Lung cancer screening. You may have this screening every year starting at age 18 if you have a 30-pack-year history of smoking and currently smoke or have quit within the past 15 years. Fecal occult blood test (FOBT) of the stool. You may have this test every year starting at age 63. Flexible sigmoidoscopy or colonoscopy. You may have a sigmoidoscopy every 5 years or a colonoscopy every 10 years starting at age 58. Hepatitis C blood test. Hepatitis B blood test. Sexually transmitted disease (STD) testing. Diabetes screening. This is done by checking your blood sugar (glucose) after you have not eaten for a while (fasting). You may have this done every 1-3 years. Bone density scan. This is done to screen for osteoporosis. You may have this done starting at age 34. Mammogram. This may be done every 1-2 years. Talk to your health care provider about how often you should have regular mammograms. Talk with your health care provider about your test results, treatment options, and if necessary, the need for more tests. Vaccines  Your health care provider may recommend certain vaccines, such as: Influenza vaccine. This is recommended every year. Tetanus, diphtheria, and acellular pertussis (Tdap, Td) vaccine. You may need a Td booster every 10 years. Zoster vaccine. You may need this after age 70. Pneumococcal 13-valent conjugate (PCV13) vaccine. One dose is recommended after age 70. Pneumococcal polysaccharide (PPSV23) vaccine. One dose is recommended after age  65. Talk to your health care provider about which  screenings and vaccines you need and how often you need them. This information is not intended to replace advice given to you by your health care provider. Make sure you discuss any questions you have with your health care provider. Document Released: 12/02/2015 Document Revised: 07/25/2016 Document Reviewed: 09/06/2015 Elsevier Interactive Patient Education  2017 [place] Prevention in the Home Falls can cause injuries. They can happen to people of all ages. There are many things you can do to make your home safe and to help prevent falls. What can I do on the outside of my home? Regularly fix the edges of walkways and driveways and fix any cracks. Remove anything that might make you trip as you walk through a door, such as a raised step or threshold. Trim any bushes or trees on the path to your home. Use bright outdoor lighting. Clear any walking paths of anything that might make someone trip, such as rocks or tools. Regularly check to see if handrails are loose or broken. Make sure that both sides of any steps have handrails. Any raised decks and porches should have guardrails on the edges. Have any leaves, snow, or ice cleared regularly. Use sand or salt on walking paths during winter. Clean up any spills in your garage right away. This includes oil or grease spills. What can I do in the bathroom? Use night lights. Install grab bars by the toilet and in the tub and shower. Do not use towel bars as grab bars. Use non-skid mats or decals in the tub or shower. If you need to sit down in the shower, use a plastic, non-slip stool. Keep the floor dry. Clean up any water that spills on the floor as soon as it happens. Remove soap buildup in the tub or shower regularly. Attach bath mats securely with double-sided non-slip rug tape. Do not have throw rugs and other things on the floor that can make you trip. What can I do in the bedroom? Use night lights. Make sure that you have a  light by your bed that is easy to reach. Do not use any sheets or blankets that are too big for your bed. They should not hang down onto the floor. Have a firm chair that has side arms. You can use this for support while you get dressed. Do not have throw rugs and other things on the floor that can make you trip. What can I do in the kitchen? Clean up any spills right away. Avoid walking on wet floors. Keep items that you use a lot in easy-to-reach places. If you need to reach something above you, use a strong step stool that has a grab bar. Keep electrical cords out of the way. Do not use floor polish or wax that makes floors slippery. If you must use wax, use non-skid floor wax. Do not have throw rugs and other things on the floor that can make you trip. What can I do with my stairs? Do not leave any items on the stairs. Make sure that there are handrails on both sides of the stairs and use them. Fix handrails that are broken or loose. Make sure that handrails are as long as the stairways. Check any carpeting to make sure that it is firmly attached to the stairs. Fix any carpet that is loose or worn. Avoid having throw rugs at the top or bottom of the stairs. If you do have  throw rugs, attach them to the floor with carpet tape. Make sure that you have a light switch at the top of the stairs and the bottom of the stairs. If you do not have them, ask someone to add them for you. What else can I do to help prevent falls? Wear shoes that: Do not have high heels. Have rubber bottoms. Are comfortable and fit you well. Are closed at the toe. Do not wear sandals. If you use a stepladder: Make sure that it is fully opened. Do not climb a closed stepladder. Make sure that both sides of the stepladder are locked into place. Ask someone to hold it for you, if possible. Clearly mark and make sure that you can see: Any grab bars or handrails. First and last steps. Where the edge of each step  is. Use tools that help you move around (mobility aids) if they are needed. These include: Canes. Walkers. Scooters. Crutches. Turn on the lights when you go into a dark area. Replace any light bulbs as soon as they burn out. Set up your furniture so you have a clear path. Avoid moving your furniture around. If any of your floors are uneven, fix them. If there are any pets around you, be aware of where they are. Review your medicines with your doctor. Some medicines can make you feel dizzy. This can increase your chance of falling. Ask your doctor what other things that you can do to help prevent falls. This information is not intended to replace advice given to you by your health care provider. Make sure you discuss any questions you have with your health care provider. Document Released: 09/01/2009 Document Revised: 04/12/2016 Document Reviewed: 12/10/2014 Elsevier Interactive Patient Education  2017 [employer].

## 2022-08-22 NOTE — H&P (Signed)
Surgical History & Physical  Patient Name: Deborah Gomez DOB: 1946/03/08  Surgery: Cataract extraction with intraocular lens implant phacoemulsification; Left Eye  Surgeon: Baruch Goldmann MD Surgery Date:  08-27-22 Pre-Op Date:  08-06-22  HPI: A 29 Yr. old female patient present for 1 week post op OD and pre op OS. 1. The patient is returning after cataract surgery. The right eye is affected. Status post cataract surgery, which began 1 weeks ago: Since the last visit, the affected area is doing well. Patient is following medication instructions: Combo drop TID OD. 2. 2. Difficulties recognizing people from a distance due to the cataract OS. Hazy/blurred vision. This is negatively affecting the patient's quality of life and the patient is unable to function adequately in life with the current level of vision. HPI was performed by Baruch Goldmann .  Medical History: Diabetes - DM Type 2 Heart Problem Kidney Dis.  Review of Systems Negative Allergic/Immunologic Negative Cardiovascular Negative Constitutional Negative Ear, Nose, Mouth & Throat Negative Endocrine Negative Eyes Negative Gastrointestinal Negative Genitourinary Negative Hemotologic/Lymphatic Negative Integumentary Negative Musculoskeletal Negative Neurological Negative Psychiatry Negative Respiratory  Social   Current every day smoker   Medication Prednisolone-Moxifloxacin-Bromfenac,  Gabapentin, Jardiance, Vitamin D2, Carvedilol, Losartan, Furosemide, Aspirin, Hydralazine, Rosuvastatin, Nifedipine,   Sx/Procedures Phaco c IOL OD,  Appendectomy, Knee Replacement, Thyroid sx, Breast Sx x2,   Drug Allergies   NKDA  History & Physical: Heent: Cataract, left eye NECK: supple without bruits LUNGS: lungs clear to auscultation CV: regular rate and rhythm Abdomen: soft and non-tender Impression & Plan: Assessment: 1.  CATARACT EXTRACTION STATUS; Right Eye (Z98.41) 2.  COMBINED FORMS AGE RELATED CATARACT; Left  Eye (H25.812)  Plan: 1.  1 week after cataract surgery. Doing well with improved vision and normal eye pressure. Call with any problems or concerns. Continue Pred-Moxi-Brom 2x/day for 3 more weeks.  2.  Cataract accounts for the patient's decreased vision. This visual impairment is not correctable with a tolerable change in glasses or contact lenses. Cataract surgery with an implantation of a new lens should significantly improve the visual and functional status of the patient. Discussed all risks, benefits, alternatives, and potential complications. Discussed the procedures and recovery. Patient desires to have surgery. A-scan ordered and performed today for intra-ocular lens calculations. The surgery will be performed in order to improve vision for driving, reading, and for eye examinations. Recommend phacoemulsification with intra-ocular lens. Recommend Dextenza for post-operative pain and inflammation. Left Eye. Surgery required to correct imbalance of vision. Dilates well - shugarcaine by protocol.

## 2022-08-22 NOTE — Progress Notes (Signed)
Virtual Visit via Telephone Note  I connected with  Deborah Gomez on 08/22/22 at 10:30 AM EDT by telephone and verified that I am speaking with the correct person using two identifiers.  Location: Patient: home Provider: [facility] Persons participating in the virtual visit: patient/Nurse Health Advisor   I discussed the limitations, risks, security and privacy concerns of performing an evaluation and management service by telephone and the availability of in person appointments. The patient expressed understanding and agreed to proceed.  Interactive audio and video telecommunications were attempted between this nurse and patient, however failed, due to patient having technical difficulties OR patient did not have access to video capability.  We continued and completed visit with audio only.  Some vital signs may be absent or patient reported.   Dionisio David, LPN  Subjective:   Deborah Gomez is a 76 y.o. female who presents for Medicare Annual (Subsequent) preventive examination.  Review of Systems     Cardiac Risk Factors include: advanced age (>38men, >85 women);diabetes mellitus;hypertension;smoking/ tobacco exposure     Objective:    There were no vitals filed for this visit. There is no height or weight on file to calculate BMI.     08/22/2022   10:39 AM 08/21/2022    9:08 AM 07/30/2022    8:37 AM 07/24/2022    2:43 PM 06/06/2021    1:23 PM 05/16/2021    3:00 AM 05/15/2021    3:31 PM  Advanced Directives  Does Patient Have a Medical Advance Directive? No No No No No No No  Would patient like information on creating a medical advance directive? No - Patient declined No - Patient declined No - Patient declined No - Patient declined No - Patient declined No - Patient declined No - Patient declined    Current Medications (verified) Outpatient Encounter Medications as of 08/22/2022  Medication Sig   aspirin EC 81 MG tablet Take 81 mg by mouth daily. Swallow whole.    calcitRIOL (ROCALTROL) 0.25 MCG capsule Take 0.25 mcg by mouth daily.   empagliflozin (JARDIANCE) 10 MG TABS tablet Take 1 tablet (10 mg total) by mouth daily.   furosemide (LASIX) 20 MG tablet Take 20 mg by mouth 2 (two) times daily.   gabapentin (NEURONTIN) 100 MG capsule Take 1 capsule (100 mg total) by mouth 3 (three) times daily. (Patient taking differently: Take 645 mg by mouth 3 (three) times daily.)   losartan (COZAAR) 50 MG tablet Take 50 mg by mouth daily.   NIFEdipine (PROCARDIA XL/NIFEDICAL-XL) 90 MG 24 hr tablet Take 90 mg by mouth daily.   rosuvastatin (CRESTOR) 40 MG tablet Take 1 tablet (40 mg total) by mouth daily.   carvedilol (COREG) 3.125 MG tablet Take 1 tablet (3.125 mg total) by mouth 2 (two) times daily with a meal. (Patient not taking: Reported on 08/22/2022)   hydrALAZINE (APRESOLINE) 50 MG tablet Take 1.5 tablets (75 mg total) by mouth 3 (three) times daily.   No facility-administered encounter medications on file as of 08/22/2022.    Allergies (verified) Patient has no known allergies.   History: Past Medical History:  Diagnosis Date   Chronic kidney disease    Diabetes mellitus without complication ([place])    Hypertension    Past Surgical History:  Procedure Laterality Date   APPENDECTOMY     BREAST SURGERY Left    CARPAL TUNNEL RELEASE Right    CATARACT EXTRACTION W/PHACO Right 07/30/2022   Procedure: CATARACT EXTRACTION PHACO AND INTRAOCULAR LENS PLACEMENT ([place]);  Surgeon: Baruch Goldmann, MD;  Location: [facility];  Service: Ophthalmology;  Laterality: Right;  CDE:19.32   REPLACEMENT TOTAL KNEE Left    THYROID SURGERY     partial removal   Family History  Problem Relation Age of Onset   Colon cancer Neg Hx    Colon polyps Neg Hx    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  Tobacco Use   Smoking status: Former    Packs/day: 0.50    Years: 52.00    Total pack years: 26.00    Types: Cigarettes    Quit date: 08/21/2021    Years since quitting: 1.0   Smokeless tobacco: Never  Vaping Use   Vaping Use: Never used  Substance and Sexual Activity   Alcohol use: Not Currently   Drug use: Never   Sexual activity: Yes    Birth control/protection: Post-menopausal  Other Topics Concern   Not on file  Social History Narrative   1 child in [place], the other is not nearby   Social Determinants of Health   Financial Resource Strain: [place]  (08/22/2022)   Overall Financial Resource Strain (CARDIA)    Difficulty of Paying Living Expenses: Not hard at all  Food Insecurity: No Food Insecurity (08/22/2022)   Hunger Vital Sign    Worried About Running Out of Food in the Last Year: Never true    Ran Out of Food in the Last Year: Never true  Transportation Needs: No Transportation Needs (08/22/2022)   PRAPARE - Hydrologist (Medical): No    Lack of Transportation (Non-Medical): No  Physical Activity: Insufficiently Active (08/22/2022)   Exercise Vital Sign    Days of Exercise per Week: 3 days    Minutes of Exercise per Session: 30 min  Stress: No Stress Concern Present (08/22/2022)   [place]    Feeling of Stress : Not at all  Social Connections: Socially Isolated (08/22/2022)   Social Connection and Isolation Panel [NHANES]    Frequency of Communication with Friends and Family: Twice a week    Frequency of Social Gatherings with Friends and Family: Once a week    Attends Religious Services: Never    Marine scientist or Organizations: No    Attends Archivist Meetings: Never    Marital Status: Widowed    Tobacco Counseling Counseling given: Not  Answered   Clinical Intake:  Pre-visit preparation completed: Yes  Pain : No/denies pain     Nutritional Risks: None Diabetes: Yes CBG done?: No Did pt. bring in CBG monitor from home?: No  How often do you need to have someone help you when you read instructions, pamphlets, or other written materials from your doctor or pharmacy?: 1 - Never  Diabetic?yes Nutrition Risk Assessment:  Has the patient had any N/V/D within the last 2 months?  No  Does the patient have any non-healing wounds?  No  Has the patient had any unintentional weight loss or weight gain?  No   Diabetes:  Is the patient diabetic?  Yes  If diabetic, was a CBG obtained today?  No  Did the  patient bring in their glucometer from home?  No  How often do you monitor your CBG's? Never.   Financial Strains and Diabetes Management:  Are you having any financial strains with the device, your supplies or your medication? No .  Does the patient want to be seen by Chronic Care Management for management of their diabetes?  No  Would the patient like to be referred to a Nutritionist or for Diabetic Management?  No   Diabetic Exams:  Diabetic Eye Exam: Completed 06/19/22. Marland Kitchen Pt has been advised about the importance in completing this exam.  Diabetic Foot Exam: Completed 01/09/22. Pt has been advised about the importance in completing this exam.   Interpreter Needed?: No  Information entered by :: Kirke Shaggy, LPN   Activities of Daily Living    08/22/2022   10:41 AM 08/21/2022    9:09 AM  In your present state of health, do you have any difficulty performing the following activities:  Hearing? 0   Vision? 0   Difficulty concentrating or making decisions? 0   Walking or climbing stairs? 0   Dressing or bathing? 0   Doing errands, shopping? 0 0  Preparing Food and eating ? N   Using the Toilet? N   In the past six months, have you accidently leaked urine? N   Do you have problems with loss of bowel control?  N   Managing your Medications? N   Managing your Finances? N   Housekeeping or managing your Housekeeping? N     Patient Care Team: Coral Spikes, DO as PCP - General (Family Medicine) Beryle Lathe, [facility] (Inactive) (Pharmacist)  Indicate any recent Medical Services you may have received from other than [facility] providers in the past year (date may be approximate).     Assessment:   This is a routine wellness examination for [place].  Hearing/Vision screen Hearing Screening - Comments:: No aids  Vision Screening - Comments:: Wears glasses- Dr.Wrdosek  Dietary issues and exercise activities discussed: Current Exercise Habits: Home exercise routine, Type of exercise: walking, Time (Minutes): 30, Frequency (Times/Week): 3, Weekly Exercise (Minutes/Week): 90, Intensity: Mild   Goals Addressed             This Visit's Progress    DIET - EAT MORE FRUITS AND VEGETABLES         Depression Screen    08/22/2022   10:38 AM 01/09/2022    9:51 AM 11/14/2021    1:26 PM 11/14/2021    1:16 PM 08/16/2021    1:21 PM 07/12/2021   10:57 AM 06/06/2021    1:24 PM  PHQ 2/9 Scores  PHQ - 2 Score 0 0 0 0 0 0 0  PHQ- 9 Score 0          Fall Risk    08/22/2022   10:40 AM 01/09/2022    9:51 AM 11/14/2021    1:26 PM 11/14/2021    1:16 PM 08/16/2021    1:21 PM  Fall Risk   Falls in the past year? 0 0 0 0 0  Number falls in past yr: 0 0 0 0 0  Injury with Fall? 0 0 0 0 0  Risk for fall due to : No Fall Risks No Fall Risks No Fall Risks No Fall Risks No Fall Risks  Follow up Falls prevention discussed;Falls evaluation completed Falls evaluation completed Falls evaluation completed Falls evaluation completed Falls evaluation completed    FALL RISK PREVENTION PERTAINING TO THE HOME:  Any stairs in or around the home? No  If so, are there any without handrails? No  Home free of loose throw rugs in walkways, pet beds, electrical cords, etc? Yes  Adequate lighting in your home to reduce  risk of falls? Yes   ASSISTIVE DEVICES UTILIZED TO PREVENT FALLS:  Life alert? No  Use of a cane, walker or w/c? Yes  Grab bars in the bathroom? Yes  Shower chair or bench in shower? No  Elevated toilet seat or a handicapped toilet? No    Cognitive Function:declined 2023 PT is A & O x 3       06/06/2021    1:25 PM  MMSE - Mini Mental State Exam  Not completed: Unable to complete        06/06/2021    1:25 PM  6CIT Screen  What Year? 0 points  What month? 0 points  What time? 0 points  Count back from 20 0 points  Months in reverse 0 points  Repeat phrase 0 points  Total Score 0 points    Immunizations  There is no immunization history on file for this patient.  TDAP status: Due, Education has been provided regarding the importance of this vaccine. Advised may receive this vaccine at local pharmacy or Health Dept. Aware to provide a copy of the vaccination record if obtained from local pharmacy or Health Dept. Verbalized acceptance and understanding.  Flu Vaccine status: Declined, Education has been provided regarding the importance of this vaccine but patient still declined. Advised may receive this vaccine at local pharmacy or Health Dept. Aware to provide a copy of the vaccination record if obtained from local pharmacy or Health Dept. Verbalized acceptance and understanding.  Pneumococcal vaccine status: Declined,  Education has been provided regarding the importance of this vaccine but patient still declined. Advised may receive this vaccine at local pharmacy or Health Dept. Aware to provide a copy of the vaccination record if obtained from local pharmacy or Health Dept. Verbalized acceptance and understanding.   Covid-19 vaccine status: Declined, Education has been provided regarding the importance of this vaccine but patient still declined. Advised may receive this vaccine at local pharmacy or Health Dept.or vaccine clinic. Aware to provide a copy of the vaccination  record if obtained from local pharmacy or Health Dept. Verbalized acceptance and understanding.  Qualifies for Shingles Vaccine? Yes   Zostavax completed No   Shingrix Completed?: No.    Education has been provided regarding the importance of this vaccine. Patient has been advised to call insurance company to determine out of pocket expense if they have not yet received this vaccine. Advised may also receive vaccine at local pharmacy or Health Dept. Verbalized acceptance and understanding.  Screening Tests Health Maintenance  Topic Date Due   TETANUS/TDAP  Never done   Zoster Vaccines- Shingrix (1 of 2) Never done   HEMOGLOBIN A1C  05/15/2022   INFLUENZA VACCINE  Never done   Pneumonia Vaccine 79+ Years old (1 - PCV) 01/09/2023 (Originally 12/10/1951)   Diabetic kidney evaluation - GFR measurement  11/14/2022   FOOT EXAM  01/09/2023   OPHTHALMOLOGY EXAM  06/20/2023   Diabetic kidney evaluation - Urine ACR  07/04/2023   DEXA SCAN  Completed   HPV VACCINES  Aged Out   COVID-19 Vaccine  Discontinued   Hepatitis C Screening  Discontinued    Health Maintenance  Health Maintenance Due  Topic Date Due   TETANUS/TDAP  Never done   Zoster Vaccines- Shingrix (  1 of 2) Never done   HEMOGLOBIN A1C  05/15/2022   INFLUENZA VACCINE  Never done    Colorectal cancer screening: Type of screening: Cologuard. Completed 08/03/21. Repeat every 3 years  Mammogram status: No longer required due to age.  Bone Density status: Completed 08/23/21. Results reflect: Bone density results: NORMAL. Repeat every 5 years.  Lung Cancer Screening: (Low Dose CT Chest recommended if Age 49-80 years, 30 pack-year currently smoking OR have quit w/in 15years.) does not qualify.   Additional Screening:  Hepatitis C Screening: does qualify; Completed no  Vision Screening: Recommended annual ophthalmology exams for early detection of glaucoma and other disorders of the eye. Is the patient up to date with their annual  eye exam?  Yes  Who is the provider or what is the name of the office in which the patient attends annual eye exams? Dr.Wrodsek If pt is not established with a provider, would they like to be referred to a provider to establish care? No .   Dental Screening: Recommended annual dental exams for proper oral hygiene  Community Resource Referral / Chronic Care Management: CRR required this visit?  No   CCM required this visit?  No      Plan:     I have personally reviewed and noted the following in the patient's chart:   Medical and social history Use of alcohol, tobacco or illicit drugs  Current medications and supplements including opioid prescriptions. Patient is not currently taking opioid prescriptions. Functional ability and status Nutritional status Physical activity Advanced directives List of other physicians Hospitalizations, surgeries, and ER visits in previous 12 months Vitals Screenings to include cognitive, depression, and falls Referrals and appointments  In addition, I have reviewed and discussed with patient certain preventive protocols, quality metrics, and best practice recommendations. A written personalized care plan for preventive services as well as general preventive health recommendations were provided to patient.     Dionisio David, LPN   [id number]   Nurse Notes: none

## 2022-08-27 ENCOUNTER — Encounter (HOSPITAL_COMMUNITY)
Admission: RE | Disposition: A | Discharge status: Home / Self Care | Payer: Self-pay | Source: Home / Self Care | Attending: Ophthalmology

## 2022-08-27 ENCOUNTER — Ambulatory Visit (HOSPITAL_BASED_OUTPATIENT_CLINIC_OR_DEPARTMENT_OTHER): Payer: Medicare HMO | Admitting: Anesthesiology

## 2022-08-27 ENCOUNTER — Ambulatory Visit (HOSPITAL_COMMUNITY)
Admission: RE | Admit: 2022-08-27 | Discharge: 2022-08-27 | Disposition: A | Discharge status: Home / Self Care | Payer: Medicare HMO | Attending: Ophthalmology | Admitting: Ophthalmology

## 2022-08-27 ENCOUNTER — Ambulatory Visit (HOSPITAL_COMMUNITY): Payer: Medicare HMO | Admitting: Anesthesiology

## 2022-08-27 ENCOUNTER — Encounter (HOSPITAL_COMMUNITY): Payer: Self-pay | Admitting: Ophthalmology

## 2022-08-27 DIAGNOSIS — Z87891 Personal history of nicotine dependence: Secondary | ICD-10-CM | POA: Insufficient documentation

## 2022-08-27 DIAGNOSIS — I10 Essential (primary) hypertension: Secondary | ICD-10-CM | POA: Diagnosis not present

## 2022-08-27 DIAGNOSIS — Z9841 Cataract extraction status, right eye: Secondary | ICD-10-CM | POA: Insufficient documentation

## 2022-08-27 DIAGNOSIS — E1136 Type 2 diabetes mellitus with diabetic cataract: Secondary | ICD-10-CM | POA: Diagnosis not present

## 2022-08-27 DIAGNOSIS — Z7984 Long term (current) use of oral hypoglycemic drugs: Secondary | ICD-10-CM | POA: Insufficient documentation

## 2022-08-27 DIAGNOSIS — H25812 Combined forms of age-related cataract, left eye: Secondary | ICD-10-CM | POA: Insufficient documentation

## 2022-08-27 DIAGNOSIS — E119 Type 2 diabetes mellitus without complications: Secondary | ICD-10-CM

## 2022-08-27 HISTORY — PX: CATARACT EXTRACTION W/PHACO: SHX586

## 2022-08-27 LAB — GLUCOSE, CAPILLARY: Glucose-Capillary: 115 mg/dL — ABNORMAL HIGH (ref 70–99)

## 2022-08-27 SURGERY — PHACOEMULSIFICATION, CATARACT, WITH IOL INSERTION
Anesthesia: Monitor Anesthesia Care | Site: Eye | Laterality: Left

## 2022-08-27 MED ORDER — FENTANYL CITRATE (PF) 100 MCG/2ML IJ SOLN
INTRAMUSCULAR | Status: AC
  Filled 2022-08-27: qty 2

## 2022-08-27 MED ORDER — SODIUM HYALURONATE 10 MG/ML IO SOLUTION
PREFILLED_SYRINGE | INTRAOCULAR | Status: DC | PRN
  Administered 2022-08-27: 0.85 mL via INTRAOCULAR

## 2022-08-27 MED ORDER — LIDOCAINE HCL 3.5 % OP GEL
1.0000 | Freq: Once | OPHTHALMIC | Status: AC
  Administered 2022-08-27: 1 via OPHTHALMIC

## 2022-08-27 MED ORDER — STERILE WATER FOR IRRIGATION IR SOLN
Status: DC | PRN
  Administered 2022-08-27: 50 mL

## 2022-08-27 MED ORDER — PHENYLEPHRINE HCL 2.5 % OP SOLN
1.0000 [drp] | OPHTHALMIC | Status: AC | PRN
  Administered 2022-08-27 (×3): 1 [drp] via OPHTHALMIC

## 2022-08-27 MED ORDER — SODIUM HYALURONATE 23MG/ML IO SOSY
PREFILLED_SYRINGE | INTRAOCULAR | Status: DC | PRN
  Administered 2022-08-27: 0.6 mL via INTRAOCULAR

## 2022-08-27 MED ORDER — LIDOCAINE HCL (PF) 1 % IJ SOLN
INTRAOCULAR | Status: DC | PRN
  Administered 2022-08-27: 1 mL via OPHTHALMIC

## 2022-08-27 MED ORDER — MOXIFLOXACIN HCL 0.5 % OP SOLN
OPHTHALMIC | Status: DC | PRN
  Administered 2022-08-27: 2 [drp] via OPHTHALMIC

## 2022-08-27 MED ORDER — EPINEPHRINE PF 1 MG/ML IJ SOLN
INTRAMUSCULAR | Status: AC
  Filled 2022-08-27: qty 2

## 2022-08-27 MED ORDER — EPINEPHRINE PF 1 MG/ML IJ SOLN
INTRAOCULAR | Status: DC | PRN
  Administered 2022-08-27: 500 mL

## 2022-08-27 MED ORDER — TETRACAINE HCL 0.5 % OP SOLN
1.0000 [drp] | OPHTHALMIC | Status: AC | PRN
  Administered 2022-08-27 (×3): 1 [drp] via OPHTHALMIC

## 2022-08-27 MED ORDER — FENTANYL CITRATE (PF) 100 MCG/2ML IJ SOLN
INTRAMUSCULAR | Status: DC | PRN
  Administered 2022-08-27: 100 ug via INTRAVENOUS

## 2022-08-27 MED ORDER — TROPICAMIDE 1 % OP SOLN
1.0000 [drp] | OPHTHALMIC | Status: AC | PRN
  Administered 2022-08-27 (×3): 1 [drp] via OPHTHALMIC

## 2022-08-27 MED ORDER — BSS IO SOLN
INTRAOCULAR | Status: DC | PRN
  Administered 2022-08-27: 15 mL via INTRAOCULAR

## 2022-08-27 MED ORDER — POVIDONE-IODINE 5 % OP SOLN
OPHTHALMIC | Status: DC | PRN
  Administered 2022-08-27: 1 via OPHTHALMIC

## 2022-08-27 SURGICAL SUPPLY — 16 items
CATARACT SUITE SIGHTPATH (MISCELLANEOUS) ×1 IMPLANT
CLOTH BEACON ORANGE TIMEOUT ST (SAFETY) ×1 IMPLANT
EYE SHIELD UNIVERSAL CLEAR (GAUZE/BANDAGES/DRESSINGS) IMPLANT
FEE CATARACT SUITE SIGHTPATH (MISCELLANEOUS) ×1 IMPLANT
GLOVE BIOGEL PI IND STRL 7.0 (GLOVE) ×2 IMPLANT
GLOVE SS BIOGEL STRL SZ 6.5 (GLOVE) IMPLANT
GOWN SRG XL 47XLVL 4 REINF (GOWN DISPOSABLE) IMPLANT
GOWN STRL NON-REIN XL LVL4 (GOWN DISPOSABLE) ×1
LENS IOL RAYNER 23.5 (Intraocular Lens) ×1 IMPLANT
LENS IOL RAYONE EMV 23.5 (Intraocular Lens) IMPLANT
NDL HYPO 18GX1.5 BLUNT FILL (NEEDLE) ×1 IMPLANT
NEEDLE HYPO 18GX1.5 BLUNT FILL (NEEDLE) ×1 IMPLANT
PAD ARMBOARD 7.5X6 YLW CONV (MISCELLANEOUS) ×1 IMPLANT
SYR TB 1ML LL NO SAFETY (SYRINGE) ×1 IMPLANT
TAPE SURG TRANSPORE 1 IN (GAUZE/BANDAGES/DRESSINGS) IMPLANT
TAPE SURGICAL TRANSPORE 1 IN (GAUZE/BANDAGES/DRESSINGS) ×1

## 2022-08-27 NOTE — Op Note (Signed)
Date of procedure: 08/27/22  Pre-operative diagnosis: Visually significant age-related combined cataract, Left Eye (H25.812)  Post-operative diagnosis: Visually significant age-related combined cataract, Left Eye (H25.812)  Procedure: Removal of cataract via phacoemulsification and insertion of intra-ocular lens Rayner [id number] +23.5D into the capsular bag of the Left Eye  Attending surgeon: Gerda Diss. Aitan Rossbach, MD, MA  Anesthesia: MAC, Topical [place]  Complications: None  Estimated Blood Loss: <15m (minimal)  Specimens: None  Implants: As above  Indications:  Visually significant age-related cataract, Left Eye  Procedure:  The patient was seen and identified in the pre-operative area. The operative eye was identified and dilated.  The operative eye was marked.  Topical anesthesia was administered to the operative eye.     The patient was then to the operative suite and placed in the supine position.  A timeout was performed confirming the patient, procedure to be performed, and all other relevant information.   The patient's face was prepped and draped in the usual fashion for intra-ocular surgery.  A lid speculum was placed into the operative eye and the surgical microscope moved into place and focused.  An inferotemporal paracentesis was created using a 20 gauge paracentesis blade.  Shugarcaine was injected into the anterior chamber.  Viscoelastic was injected into the anterior chamber.  A temporal clear-corneal main wound incision was created using a 2.481mmicrokeratome.  A continuous curvilinear capsulorrhexis was initiated using an irrigating cystitome and completed using capsulorrhexis forceps.  Hydrodissection and hydrodeliniation were performed.  Viscoelastic was injected into the anterior chamber.  A phacoemulsification handpiece and a chopper as a second instrument were used to remove the nucleus and epinucleus. The irrigation/aspiration handpiece was used to remove any remaining  cortical material.   The capsular bag was reinflated with viscoelastic, checked, and found to be intact.  The intraocular lens was inserted into the capsular bag.  The irrigation/aspiration handpiece was used to remove any remaining viscoelastic.  The clear corneal wound and paracentesis wounds were then hydrated and checked with Weck-Cels to be watertight.  Moxifloxacin drops were instilled on the eye. The lid-speculum was removed.  The drape was removed.  The patient's face was cleaned with a wet and dry 4x4.    A clear shield was taped over the eye. The patient was taken to the post-operative care unit in good condition, having tolerated the procedure well.  Post-Op Instructions: [facility] and will receive all other orders and instructions.

## 2022-08-27 NOTE — Discharge Instructions (Addendum)
Please discharge patient when stable, will follow up today with Dr. Wrzosek at the [facility] [place] office immediately following discharge.  Leave shield in place until visit.  All paperwork with discharge instructions will be given at the office.  [facility] Address:  [address]  [place], [place] [address]   PATIENT INSTRUCTIONS POST-ANESTHESIA  IMMEDIATELY FOLLOWING SURGERY:  Do not drive or operate machinery for the first twenty four hours after surgery.  Do not make any important decisions for twenty four hours after surgery or while taking narcotic pain medications or sedatives.  If you develop intractable nausea and vomiting or a severe headache please notify your doctor immediately.  FOLLOW-UP:  Please make an appointment with your surgeon as instructed. You do not need to follow up with anesthesia unless specifically instructed to do so.  WOUND CARE INSTRUCTIONS (if applicable):  Keep a dry clean dressing on the anesthesia/puncture wound site if there is drainage.  Once the wound has quit draining you may leave it open to air.  Generally you should leave the bandage intact for twenty four hours unless there is drainage.  If the epidural site drains for more than 36-48 hours please call the anesthesia department.  QUESTIONS?:  Please feel free to call your physician or the hospital operator if you have any questions, and they will be happy to assist you.       

## 2022-08-27 NOTE — Anesthesia Preprocedure Evaluation (Signed)
Anesthesia Evaluation  Patient identified by MRN, date of birth, ID band Patient awake    Reviewed: Allergy & Precautions, NPO status , Patient's Chart, lab work & pertinent test results, reviewed documented beta blocker date and time   Airway Mallampati: II  TM Distance: >3 FB Neck ROM: Full    Dental  (+) Edentulous Upper, Edentulous Lower   Pulmonary neg pulmonary ROS, former smoker,    Pulmonary exam normal breath sounds clear to auscultation       Cardiovascular Exercise Tolerance: Good hypertension, Pt. on medications and Pt. on home beta blockers Normal cardiovascular exam Rhythm:Regular Rate:Normal     Neuro/Psych negative neurological ROS  negative psych ROS   GI/Hepatic negative GI ROS, Neg liver ROS,   Endo/Other  diabetes, Well Controlled, Type 2, Oral Hypoglycemic Agents  Renal/GU Renal InsufficiencyRenal disease  negative genitourinary   Musculoskeletal negative musculoskeletal ROS (+)   Abdominal   Peds negative pediatric ROS (+)  Hematology  (+) Blood dyscrasia, anemia ,   Anesthesia Other Findings   Reproductive/Obstetrics negative OB ROS                             Anesthesia Physical  Anesthesia Plan  ASA: 2  Anesthesia Plan: MAC   Post-op Pain Management: Minimal or no pain anticipated   Induction:   PONV Risk Score and Plan:   Airway Management Planned: Natural Airway and Nasal Cannula  Additional Equipment:   Intra-op Plan:   Post-operative Plan:   Informed Consent: I have reviewed the patients History and Physical, chart, labs and discussed the procedure including the risks, benefits and alternatives for the proposed anesthesia with the patient or authorized representative who has indicated his/her understanding and acceptance.       Plan Discussed with: CRNA and Surgeon  Anesthesia Plan Comments:         Anesthesia Quick Evaluation

## 2022-08-27 NOTE — Interval H&P Note (Signed)
History and Physical Interval Note:  08/27/2022 11:42 AM  Deborah Gomez  has presented today for surgery, with the diagnosis of combined forms age related cataract; left.  The various methods of treatment have been discussed with the patient and family. After consideration of risks, benefits and other options for treatment, the patient has consented to  Procedure(s) with comments: CATARACT EXTRACTION PHACO AND INTRAOCULAR LENS PLACEMENT ([facility]) (Left) - CDE:  as a surgical intervention.  The patient's history has been reviewed, patient examined, no change in status, stable for surgery.  I have reviewed the patient's chart and labs.  Questions were answered to the patient's satisfaction.     Baruch Goldmann

## 2022-08-27 NOTE — Anesthesia Postprocedure Evaluation (Signed)
Anesthesia Post Note  Patient: Deborah Gomez  Procedure(s) Performed: CATARACT EXTRACTION PHACO AND INTRAOCULAR LENS PLACEMENT ([facility]) (Left: Eye)  Patient location during evaluation: Phase II Anesthesia Type: MAC Level of consciousness: awake and alert and oriented Pain management: pain level controlled Vital Signs Assessment: post-procedure vital signs reviewed and stable Respiratory status: spontaneous breathing, nonlabored ventilation and respiratory function stable Cardiovascular status: stable and blood pressure returned to baseline Postop Assessment: no apparent nausea or vomiting Anesthetic complications: no   There were no known notable events for this encounter.   Last Vitals:  Vitals:   08/27/22 1111 08/27/22 1200  BP: (!) 142/54 (!) 135/59  Pulse: 71 77  Resp: 11 19  Temp: 36.7 C 37 C  SpO2: 97% 99%    Last Pain:  Vitals:   08/27/22 1200  TempSrc: Oral  PainSc: 0-No pain                 Deborah Gomez

## 2022-08-27 NOTE — Transfer of Care (Signed)
Immediate Anesthesia Transfer of Care Note  Patient: Deborah Gomez  Procedure(s) Performed: CATARACT EXTRACTION PHACO AND INTRAOCULAR LENS PLACEMENT ([facility]) (Left: Eye)  Patient Location: PACU and Short Stay  Anesthesia Type:MAC  Level of Consciousness: awake  Airway & Oxygen Therapy: Patient Spontanous Breathing  Post-op Assessment: Report given to RN  Post vital signs: Reviewed  Last Vitals:  Vitals Value Taken Time  BP    Temp    Pulse    Resp    SpO2      Last Pain:  Vitals:   08/27/22 1200  TempSrc: Oral  PainSc: 0-No pain         Complications: There were no known notable events for this encounter.

## 2022-08-29 ENCOUNTER — Encounter (HOSPITAL_COMMUNITY): Payer: Self-pay | Admitting: Ophthalmology

## 2022-09-11 DIAGNOSIS — E211 Secondary hyperparathyroidism, not elsewhere classified: Secondary | ICD-10-CM | POA: Diagnosis not present

## 2022-09-11 DIAGNOSIS — D638 Anemia in other chronic diseases classified elsewhere: Secondary | ICD-10-CM | POA: Diagnosis not present

## 2022-09-11 DIAGNOSIS — R808 Other proteinuria: Secondary | ICD-10-CM | POA: Diagnosis not present

## 2022-09-11 DIAGNOSIS — E1122 Type 2 diabetes mellitus with diabetic chronic kidney disease: Secondary | ICD-10-CM | POA: Diagnosis not present

## 2022-09-11 DIAGNOSIS — N189 Chronic kidney disease, unspecified: Secondary | ICD-10-CM | POA: Diagnosis not present

## 2022-09-11 DIAGNOSIS — I129 Hypertensive chronic kidney disease with stage 1 through stage 4 chronic kidney disease, or unspecified chronic kidney disease: Secondary | ICD-10-CM | POA: Diagnosis not present

## 2022-09-11 DIAGNOSIS — I5032 Chronic diastolic (congestive) heart failure: Secondary | ICD-10-CM | POA: Diagnosis not present

## 2022-10-25 DIAGNOSIS — E559 Vitamin D deficiency, unspecified: Secondary | ICD-10-CM | POA: Diagnosis not present

## 2022-10-25 DIAGNOSIS — R808 Other proteinuria: Secondary | ICD-10-CM | POA: Diagnosis not present

## 2022-10-25 DIAGNOSIS — I350 Nonrheumatic aortic (valve) stenosis: Secondary | ICD-10-CM | POA: Diagnosis not present

## 2022-10-25 DIAGNOSIS — E211 Secondary hyperparathyroidism, not elsewhere classified: Secondary | ICD-10-CM | POA: Diagnosis not present

## 2022-10-25 DIAGNOSIS — D638 Anemia in other chronic diseases classified elsewhere: Secondary | ICD-10-CM | POA: Diagnosis not present

## 2022-10-25 DIAGNOSIS — E1122 Type 2 diabetes mellitus with diabetic chronic kidney disease: Secondary | ICD-10-CM | POA: Diagnosis not present

## 2022-10-25 DIAGNOSIS — N189 Chronic kidney disease, unspecified: Secondary | ICD-10-CM | POA: Diagnosis not present

## 2022-10-25 DIAGNOSIS — I5032 Chronic diastolic (congestive) heart failure: Secondary | ICD-10-CM | POA: Diagnosis not present

## 2022-11-02 DIAGNOSIS — N170 Acute kidney failure with tubular necrosis: Secondary | ICD-10-CM | POA: Diagnosis not present

## 2022-11-02 DIAGNOSIS — N189 Chronic kidney disease, unspecified: Secondary | ICD-10-CM | POA: Diagnosis not present

## 2022-11-02 DIAGNOSIS — I129 Hypertensive chronic kidney disease with stage 1 through stage 4 chronic kidney disease, or unspecified chronic kidney disease: Secondary | ICD-10-CM | POA: Diagnosis not present

## 2022-11-02 DIAGNOSIS — I5032 Chronic diastolic (congestive) heart failure: Secondary | ICD-10-CM | POA: Diagnosis not present

## 2022-11-02 DIAGNOSIS — E211 Secondary hyperparathyroidism, not elsewhere classified: Secondary | ICD-10-CM | POA: Diagnosis not present

## 2022-11-02 DIAGNOSIS — E1122 Type 2 diabetes mellitus with diabetic chronic kidney disease: Secondary | ICD-10-CM | POA: Diagnosis not present

## 2022-11-02 DIAGNOSIS — R808 Other proteinuria: Secondary | ICD-10-CM | POA: Diagnosis not present

## 2022-11-02 DIAGNOSIS — D638 Anemia in other chronic diseases classified elsewhere: Secondary | ICD-10-CM | POA: Diagnosis not present

## 2022-11-08 ENCOUNTER — Other Ambulatory Visit: Payer: Self-pay

## 2022-11-08 DIAGNOSIS — E1122 Type 2 diabetes mellitus with diabetic chronic kidney disease: Secondary | ICD-10-CM

## 2022-11-08 MED ORDER — GABAPENTIN 100 MG PO CAPS: 100.0000 mg | ORAL_CAPSULE | Freq: Three times a day (TID) | ORAL | 3 refills | Status: DC

## 2022-11-20 ENCOUNTER — Other Ambulatory Visit: Payer: Self-pay | Admitting: *Deleted

## 2022-11-20 DIAGNOSIS — E1122 Type 2 diabetes mellitus with diabetic chronic kidney disease: Secondary | ICD-10-CM

## 2022-11-20 DIAGNOSIS — E785 Hyperlipidemia, unspecified: Secondary | ICD-10-CM

## 2022-11-20 MED ORDER — ROSUVASTATIN CALCIUM 40 MG PO TABS: 40.0000 mg | ORAL_TABLET | Freq: Every day | ORAL | 0 refills | Status: DC

## 2022-11-20 MED ORDER — EMPAGLIFLOZIN 10 MG PO TABS: 10.0000 mg | ORAL_TABLET | Freq: Every day | ORAL | 3 refills | Status: DC

## 2022-12-24 ENCOUNTER — Encounter: Payer: Self-pay | Admitting: Physician Assistant

## 2022-12-24 NOTE — Progress Notes (Addendum)
Cardiology Office Note    Date:  12/25/2022   ID:  Deborah Gomez, DOB 28-Dec-1945, MRN [id number]  PCP:  Coral Spikes, DO  Cardiologist:  Carlyle Dolly, MD  Electrophysiologist:  None   Chief Complaint: f/u diastolic dysfunction, HTN  History of Present Illness:   Deborah Gomez (leh-GET) is a 77 y.o. female with history of HTN, HLD, CKD stage IV followed by Dr. Theador Hawthorne, DM, diastolic dysfunction, mild MR, aortic sclerosis and LVH seen for follow-up. She established care with Dr. Harl Bowie in 12/2021. Prior renal artery duplex 06/2021 showed 1-59% stenosis, no obstruction. 2D Echo 07/2021 EF 65-70%, severe asymmetric LVH of the septal segment, G1DD, small pericardial effusion, mild MR, aortic sclerosis without stenosis. Continue medical therapy with BP control was recommended, no evidence of heart failure at the time. Her hydralazine was increased to '75mg'$  TID at that visit.  She returns for follow-up overall doing well. She has not had any recent chest pain, palpitations, diaphoresis, nausea, or syncope. She reports stable chronic mild DOE with higher levels of activity without any interim changes. She reports a longstanding history of heart murmur. Last nephrology note reviewed, mentions aortic stenosis. Patient denies any interim echocardiograms elsewhere. Reports home BPs still running 130s-mid 140s as similarly reported at last visit. She is also noted to have asymmetry of her neck with what looks like left thyroid gland enlargement. She reports this has been present for a long time, that she had remote thyroid surgery in the 1980s. I asked her if primary care is monitoring this and she said she thought so. I encouraged her to discuss at upcoming follow-up this month.    Labwork independently reviewed: 10/2022 Mg 2.5, K 4.3, Cr 2.41, Hgb 11.2. plt 258 10/2021 LDL 70, trig 81 (PCP), A1C 6.1, LFTs ok 06/2021 TSH, free T4 wnl  Past History   Past Medical History:  Diagnosis Date   Aortic  valve sclerosis    Chronic kidney disease    CKD (chronic kidney disease), stage IV (HCC)    Diabetes mellitus (HCC)    Diastolic dysfunction    Hypertension    LVH (left ventricular hypertrophy)     Past Surgical History:  Procedure Laterality Date   APPENDECTOMY     BREAST SURGERY Left    CARPAL TUNNEL RELEASE Right    CATARACT EXTRACTION W/PHACO Right 07/30/2022   Procedure: CATARACT EXTRACTION PHACO AND INTRAOCULAR LENS PLACEMENT ([place]);  Surgeon: Baruch Goldmann, MD;  Location: [facility];  Service: Ophthalmology;  Laterality: Right;  CDE:19.32   CATARACT EXTRACTION W/PHACO Left 08/27/2022   Procedure: CATARACT EXTRACTION PHACO AND INTRAOCULAR LENS PLACEMENT ([facility]);  Surgeon: Baruch Goldmann, MD;  Location: [facility];  Service: Ophthalmology;  Laterality: Left;  CDE: 32.13   REPLACEMENT TOTAL KNEE Left    THYROID SURGERY     partial removal    Current Medications: Current Meds  Medication Sig   aspirin EC 81 MG tablet Take 81 mg by mouth daily. Swallow whole.   calcitRIOL (ROCALTROL) 0.25 MCG capsule Take 0.25 mcg by mouth daily.   empagliflozin (JARDIANCE) 10 MG TABS tablet Take 1 tablet (10 mg total) by mouth daily.   furosemide (LASIX) 20 MG tablet Take 20 mg by mouth 2 (two) times daily.   gabapentin (NEURONTIN) 100 MG capsule Take 1 capsule (100 mg total) by mouth 3 (three) times daily.   hydrALAZINE (APRESOLINE) 50 MG tablet Take 1.5 tablets (75 mg total) by mouth 3 (three) times daily.   losartan (COZAAR) 50  MG tablet Take 50 mg by mouth daily.   NIFEdipine (PROCARDIA XL/NIFEDICAL-XL) 90 MG 24 hr tablet Take 90 mg by mouth daily.   rosuvastatin (CRESTOR) 40 MG tablet Take 1 tablet (40 mg total) by mouth daily.     Allergies:   Patient has no known allergies.   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  Tobacco Use   Smoking status: Former    Packs/day: 0.50    Years: 52.00    Total pack years: 26.00    Types: Cigarettes    Quit date: 08/21/2021    Years since quitting: 1.3   Smokeless tobacco: Never  Vaping Use   Vaping Use: Never used  Substance and Sexual Activity   Alcohol use: Not Currently   Drug use: Never   Sexual activity: Yes    Birth control/protection: Post-menopausal  Other Topics Concern   Not on file  Social History Narrative   1 child in [place], the other is not nearby   Social Determinants of Health   Financial Resource Strain: [place]  (08/22/2022)   Overall Financial Resource Strain (CARDIA)    Difficulty of Paying Living Expenses: Not hard at all  Food Insecurity: No Food Insecurity (08/22/2022)   Hunger Vital Sign    Worried About Running Out of Food in the Last Year: Never true    [place] in the Last Year: Never true  Transportation Needs: No Transportation Needs (08/22/2022)   PRAPARE - Hydrologist (Medical): No    Lack of Transportation (Non-Medical): No  Physical Activity: Insufficiently Active (08/22/2022)   Exercise Vital Sign    Days of Exercise per Week: 3 days    Minutes of Exercise per Session: 30 min  Stress: No Stress Concern Present (08/22/2022)   [place]    Feeling of Stress : Not at all  Social Connections: Socially Isolated (08/22/2022)   Social Connection and Isolation Panel [NHANES]    Frequency of Communication with Friends and Family: Twice a week    Frequency of Social Gatherings with Friends and Family: Once a week    Attends Religious Services: Never    Marine scientist or Organizations: No    Attends Archivist Meetings: Never    Marital Status: Widowed     Family History:  The patient's family history is negative  for Colon cancer and Colon polyps. She was adopted.  ROS:   Please see the history of present illness.  All other systems are reviewed and otherwise negative.    EKG(s)/Additional Testing   EKG:  EKG is ordered today, personally reviewed, demonstrating NSR 77bpm, cannot exclude prior inferior infarct / anterolateral infarct with narrow Q waves in III, avF, V3-V6.  Nonspecific TW flattening III, avF (this was seen in prior tracing). Repeated EKG to verify placement.   CV Studies: Cardiac studies reviewed are outlined and summarized above. Otherwise please see EMR for full report.  Recent Labs: No results found for requested labs within last 365 days.  Recent Lipid Panel    Component Value Date/Time   CHOL 177 11/14/2021 1359  TRIG 81 11/14/2021 1359   HDL 92 11/14/2021 1359   LDLCALC 70 11/14/2021 1359    PHYSICAL EXAM:    VS:  BP (!) 148/62   Pulse 86   Ht '5\' 4"'$  (1.626 m)   Wt 161 lb (73 kg)   SpO2 99%   BMI 27.64 kg/m   BMI: Body mass index is 27.64 kg/m.  GEN: Well nourished, well developed female in no acute distress HEENT: normocephalic, atraumatic Neck: no JVD, carotid bruits. + asymmetric thyroid enlargement on the left which patient states is chronic - encouraged her to discuss continued monitoring with PCP Cardiac: RRR; 2/6 SEM RSB, no rubs or gallops, no edema  Respiratory:  clear to auscultation bilaterally, normal work of breathing GI: soft, nontender, nondistended, + BS MS: no deformity or atrophy Skin: warm and dry, no rash Neuro:  Alert and Oriented x 3, Strength and sensation are intact, follows commands Psych: euthymic mood, full affect  Wt Readings from Last 3 Encounters:  12/25/22 161 lb (73 kg)  08/27/22 168 lb (76.2 kg)  08/22/22 168 lb (76.2 kg)     ASSESSMENT & PLAN:   1. Diastolic dysfunction with LVH, abnormal EKG - LVH suspected due to hypertensive heart disease given history of HTN. She is not having any clinical signs of CHF at this  time. Interestingly her EKG does show lower voltage today than prior, with narrow Q waves inferiorly and V3-V6 (EKG rechecked to confirm). She is not describing any anginal symptoms or any changes in her health otherwise. We will plan to repeat echo to reassess LVH and LV function. Based on study result, will plan to discuss evaluation for infiltrative CM versus ischemic eval with primary cardiologist.  2. Small pericardial effusion - small pericardial effusion noted on echocardiogram in 2022. Given lower voltage on EKG will repeat study. Clinically stable otherwise.  3. Essential HTN - given diastolic dysfunction and LVH, ideal goal SBP <130/80. Will titrate hydralazine to '100mg'$  TID. Otherwise she is maintained on Lasix, losartan, and nifedipine by outside providers. Carvedilol may be an additional option for treatment. Would likely avoid thiazide/MRA with CKD. Will also get repeat BMET and TSH today.  HYPERTENSION CONTROL Vitals:   12/25/22 1248 12/25/22 1318  BP: (!) 146/58 (!) 148/62    The patient's blood pressure is elevated above target today.  In order to address the patient's elevated BP: A current anti-hypertensive medication was adjusted today.       4. Aortic sclerosis without stenosis, prior mild mitral regurgitation as well - since getting echocardiogram as above, will be able to reassess finding.  5. Mild anemia - 10-11 range by last check, will obtain f/u value with labs since planning the above.    Disposition: F/u with me or APP in 6 weeks after echocardiogram.  Medication Adjustments/Labs and Tests Ordered: Current medicines are reviewed at length with the patient today.  Concerns regarding medicines are outlined above. Medication changes, Labs and Tests ordered today are summarized above and listed in the Patient Instructions accessible in Encounters.    Signed, Charlie Pitter, PA-C  12/25/2022 1:01 PM    [facility] in [place]. [place], [place] [address] Ph: [phone]; Fax [phone]

## 2022-12-25 ENCOUNTER — Encounter: Payer: Self-pay | Admitting: Physician Assistant

## 2022-12-25 ENCOUNTER — Other Ambulatory Visit (HOSPITAL_COMMUNITY)
Admission: RE | Admit: 2022-12-25 | Discharge: 2022-12-25 | Disposition: A | Discharge status: Home / Self Care | Payer: Medicare HMO | Source: Ambulatory Visit | Attending: Physician Assistant | Admitting: Physician Assistant

## 2022-12-25 ENCOUNTER — Ambulatory Visit: Payer: Medicare HMO | Attending: Physician Assistant | Admitting: Physician Assistant

## 2022-12-25 VITALS — BP 148/62 | HR 86 | Ht 64.0 in | Wt 161.0 lb

## 2022-12-25 DIAGNOSIS — I5189 Other ill-defined heart diseases: Secondary | ICD-10-CM | POA: Insufficient documentation

## 2022-12-25 DIAGNOSIS — I517 Cardiomegaly: Secondary | ICD-10-CM | POA: Insufficient documentation

## 2022-12-25 DIAGNOSIS — R9431 Abnormal electrocardiogram [ECG] [EKG]: Secondary | ICD-10-CM

## 2022-12-25 DIAGNOSIS — I3139 Other pericardial effusion (noninflammatory): Secondary | ICD-10-CM

## 2022-12-25 DIAGNOSIS — I358 Other nonrheumatic aortic valve disorders: Secondary | ICD-10-CM

## 2022-12-25 DIAGNOSIS — D649 Anemia, unspecified: Secondary | ICD-10-CM

## 2022-12-25 DIAGNOSIS — I10 Essential (primary) hypertension: Secondary | ICD-10-CM | POA: Diagnosis not present

## 2022-12-25 LAB — BASIC METABOLIC PANEL
Anion gap: 10 (ref 5–15)
BUN: 32 mg/dL — ABNORMAL HIGH (ref 8–23)
CO2: 25 mmol/L (ref 22–32)
Calcium: 9 mg/dL (ref 8.9–10.3)
Chloride: 103 mmol/L (ref 98–111)
Creatinine, Ser: 2.19 mg/dL — ABNORMAL HIGH (ref 0.44–1.00)
GFR, Estimated: 23 mL/min — ABNORMAL LOW (ref >60)
Glucose, Bld: 123 mg/dL — ABNORMAL HIGH (ref 70–99)
Potassium: 3.9 mmol/L (ref 3.5–5.1)
Sodium: 138 mmol/L (ref 135–145)

## 2022-12-25 LAB — CBC
HCT: 35.8 % — ABNORMAL LOW (ref 36.0–46.0)
Hemoglobin: 11.3 g/dL — ABNORMAL LOW (ref 12.0–15.0)
MCH: 27.8 pg (ref 26.0–34.0)
MCHC: 31.6 g/dL (ref 30.0–36.0)
MCV: 88 fL (ref 80.0–100.0)
Platelets: 267 10*3/uL (ref 150–400)
RBC: 4.07 MIL/uL (ref 3.87–5.11)
RDW: 14.7 % (ref 11.5–15.5)
WBC: 7 10*3/uL (ref 4.0–10.5)
nRBC: 0 % (ref 0.0–0.2)

## 2022-12-25 LAB — TSH: TSH: 0.689 u[IU]/mL (ref 0.350–4.500)

## 2022-12-25 NOTE — Patient Instructions (Addendum)
Medication Instructions:   INCREASE Hydralazine to 100 mg three times a day  Labwork:  cbc,bmet,tsh today  Testing/Procedures: Your physician has requested that you have an echocardiogram. Echocardiography is a painless test that uses sound waves to create images of your heart. It provides your doctor with information about the size and shape of your heart and how well your heart's chambers and valves are working. This procedure takes approximately one hour. There are no restrictions for this procedure. Please do NOT wear cologne, perfume, aftershave, or lotions (deodorant is allowed). Please arrive 15 minutes prior to your appointment time.   Follow-Up:6 weeks   Any Other Special Instructions Will Be Listed Below (If Applicable).  If you need a refill on your cardiac medications before your next appointment, please call your pharmacy.  We need to get a better idea of what your blood pressure is running at home. Here are some instructions to follow: - I would recommend using a blood pressure cuff that goes on your arm. The wrist ones can be inaccurate. If you're purchasing one for the first time, try to select one that also reports your heart rate because this can be helpful information as well. - To check your blood pressure, choose a time at least 3 hours after taking your blood pressure medicines. If you can sample it at different times of the day, that's great - it might give you more information about how your blood pressure fluctuates. Remain seated in a chair for 5 minutes quietly beforehand, then check it.  - Please record a list of those readings and call us/send in MyChart message on 01/01/23.

## 2023-01-09 ENCOUNTER — Encounter (INDEPENDENT_AMBULATORY_CARE_PROVIDER_SITE_OTHER): Payer: Self-pay | Admitting: *Deleted

## 2023-01-09 ENCOUNTER — Other Ambulatory Visit (HOSPITAL_COMMUNITY)
Admission: RE | Admit: 2023-01-09 | Discharge: 2023-01-09 | Disposition: A | Discharge status: Home / Self Care | Payer: Medicare HMO | Source: Ambulatory Visit | Attending: Family Medicine | Admitting: Family Medicine

## 2023-01-09 ENCOUNTER — Ambulatory Visit (INDEPENDENT_AMBULATORY_CARE_PROVIDER_SITE_OTHER): Payer: Medicare HMO | Admitting: Family Medicine

## 2023-01-09 VITALS — BP 146/70 | HR 93 | Temp 99.4°F | Ht 64.0 in | Wt 154.0 lb

## 2023-01-09 DIAGNOSIS — E782 Mixed hyperlipidemia: Secondary | ICD-10-CM | POA: Diagnosis not present

## 2023-01-09 DIAGNOSIS — Z87891 Personal history of nicotine dependence: Secondary | ICD-10-CM | POA: Diagnosis not present

## 2023-01-09 DIAGNOSIS — N184 Chronic kidney disease, stage 4 (severe): Secondary | ICD-10-CM | POA: Diagnosis not present

## 2023-01-09 DIAGNOSIS — I10 Essential (primary) hypertension: Secondary | ICD-10-CM

## 2023-01-09 DIAGNOSIS — E1122 Type 2 diabetes mellitus with diabetic chronic kidney disease: Secondary | ICD-10-CM | POA: Diagnosis not present

## 2023-01-09 DIAGNOSIS — R051 Acute cough: Secondary | ICD-10-CM

## 2023-01-09 DIAGNOSIS — R195 Other fecal abnormalities: Secondary | ICD-10-CM | POA: Diagnosis not present

## 2023-01-09 LAB — HEMOGLOBIN A1C
Hgb A1c MFr Bld: 5.9 % — ABNORMAL HIGH (ref 4.8–5.6)
Mean Plasma Glucose: 122.63 mg/dL

## 2023-01-09 LAB — LIPID PANEL
Cholesterol: 98 mg/dL (ref 0–200)
HDL: 44 mg/dL (ref >40)
LDL Cholesterol: 35 mg/dL (ref 0–99)
Total CHOL/HDL Ratio: 2.2 RATIO
Triglycerides: 96 mg/dL (ref <150)
VLDL: 19 mg/dL (ref 0–40)

## 2023-01-09 NOTE — Patient Instructions (Signed)
Labs today.  Chest xray at the hospital. We will call with results.  Follow up in 3 months.

## 2023-01-10 DIAGNOSIS — R051 Acute cough: Secondary | ICD-10-CM | POA: Insufficient documentation

## 2023-01-10 DIAGNOSIS — Z87891 Personal history of nicotine dependence: Secondary | ICD-10-CM | POA: Insufficient documentation

## 2023-01-10 LAB — LIPID PANEL
Chol/HDL Ratio: 2.1 ratio (ref 0.0–4.4)
Cholesterol, Total: 106 mg/dL (ref 100–199)
HDL: 50 mg/dL (ref >39)
LDL Chol Calc (NIH): 35 mg/dL (ref 0–99)
Triglycerides: 115 mg/dL (ref 0–149)
VLDL Cholesterol Cal: 21 mg/dL (ref 5–40)

## 2023-01-10 LAB — HEMOGLOBIN A1C
Est. average glucose Bld gHb Est-mCnc: 131 mg/dL
Hgb A1c MFr Bld: 6.2 % — ABNORMAL HIGH (ref 4.8–5.6)

## 2023-01-10 LAB — MICROALBUMIN / CREATININE URINE RATIO
Creatinine, Urine: 111.6 mg/dL
Microalb/Creat Ratio: 186 mg/g creat — ABNORMAL HIGH (ref 0–29)
Microalbumin, Urine: 207.4 ug/mL

## 2023-01-10 NOTE — Progress Notes (Signed)
Subjective:  Patient ID: Deborah Gomez, female    DOB: [birth year]  Age: 77 y.o. MRN: [id number]  CC: Chief Complaint  Patient presents with   Diabetes   Allergic Rhinitis     X 2 weeks    Diarrhea    2 weeks    HPI:  77 year old female with the below mentioned past medical history presents for follow-up.  Patient's diabetes has been well-controlled.  She is currently on Jardiance.  Needs A1c.  Patient has underlying CKD.  Most recent GFR 23.  Jardiance is still okay to be used as her GFR is greater than 20.  Patient's blood pressure mildly elevated here today.  She is on losartan, nifedipine, and hydralazine. Follows with nephrology.  Needs lipid panel. Has been well controlled on Crestor.   Patient reports that she has had ongoing cough and fatigue.  She has had some diarrhea and has been blowing her nose as well.  No fever.  Patient's had a positive Cologuard previously.  I cannot see where she had a colonoscopy.  Will discuss this today.  Patient is a candidate for lung cancer screening.  Will discuss this today as well.  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   Anemia of chronic disease 01/09/2022   Goiter 01/09/2022   Positive colorectal cancer screening using Cologuard test 12/19/2021   HLD (hyperlipidemia) 08/16/2021   Vitamin D deficiency 06/06/2021   Hypertension 05/15/2021    Social Hx   Social History   Socioeconomic History   Marital status: Significant Other    Spouse name: Not on file   Number of children: 2   Years of education: Not on file   Highest education level: Not on file  Occupational History   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  Tobacco Use    Smoking status: Former    Packs/day: 0.50    Years: 52.00    Total pack years: 26.00    Types: Cigarettes    Quit date: 08/21/2021    Years since quitting: 1.3   Smokeless tobacco: Never  Vaping Use   Vaping Use: Never used  Substance and Sexual Activity   Alcohol use: Not Currently   Drug use: Never   Sexual activity: Yes    Birth control/protection: Post-menopausal  Other Topics Concern   Not on file  Social History Narrative   1 child in [place], the other is not nearby   Social Determinants of Health   Financial Resource Strain: Low Risk  (08/22/2022)   Overall Financial Resource Strain (CARDIA)    Difficulty of Paying Living Expenses: Not hard at all  Food Insecurity: No Food Insecurity (08/22/2022)   Hunger Vital Sign    Worried About Running Out of Food in the Last Year: Never true    Ran Out of Food in the Last Year: Never true  Transportation Needs: No Transportation Needs (08/22/2022)   PRAPARE - Hydrologist (Medical): No    Lack of Transportation (Non-Medical): No  Physical Activity: Insufficiently Active (08/22/2022)   Exercise Vital Sign    Days of Exercise per Week: 3 days    Minutes of Exercise per Session: 30 min  Stress: No Stress  Concern Present (08/22/2022)   [place]    Feeling of Stress : Not at all  Social Connections: Socially Isolated (08/22/2022)   Social Connection and Isolation Panel [NHANES]    Frequency of Communication with Friends and Family: Twice a week    Frequency of Social Gatherings with Friends and Family: Once a week    Attends Religious Services: Never    Marine scientist or Organizations: No    Attends Archivist Meetings: Never    Marital Status: Widowed    Review of Systems Per HPI  Objective:  BP (!) 146/70   Pulse 93   Temp 99.4 F (37.4 C)   Ht 5' 4"$  (1.626 m)   Wt 154 lb (69.9 kg)   BMI 26.43 kg/m       01/09/2023   10:42 AM 01/09/2023   10:38 AM 12/25/2022    1:18 PM  BP/Weight  Systolic BP 123456 [id number]  Diastolic BP 70 69 62  Wt. (Lbs)  154   BMI  26.43 kg/m2     Physical Exam Vitals and nursing note reviewed.  Constitutional:      General: She is not in acute distress.    Appearance: Normal appearance.  HENT:     Head: Normocephalic and atraumatic.  Eyes:     General:        Right eye: No discharge.        Left eye: No discharge.     Conjunctiva/sclera: Conjunctivae normal.  Cardiovascular:     Rate and Rhythm: Normal rate and regular rhythm.  Pulmonary:     Effort: Pulmonary effort is normal.     Breath sounds: Normal breath sounds. No wheezing, rhonchi or rales.  Neurological:     Mental Status: She is alert.  Psychiatric:        Mood and Affect: Mood normal.        Behavior: Behavior normal.     Lab Results  Component Value Date   WBC 7.0 12/25/2022   HGB 11.3 (L) 12/25/2022   HCT 35.8 (L) 12/25/2022   PLT 267 12/25/2022   GLUCOSE 123 (H) 12/25/2022   CHOL 98 01/09/2023   TRIG 96 01/09/2023   HDL 44 01/09/2023   LDLCALC 35 01/09/2023   ALT 14 11/14/2021   AST 19 11/14/2021   NA 138 12/25/2022   K 3.9 12/25/2022   CL 103 12/25/2022   CREATININE 2.19 (H) 12/25/2022   BUN 32 (H) 12/25/2022   CO2 25 12/25/2022   TSH 0.689 12/25/2022   HGBA1C 5.9 (H) 01/09/2023     Assessment & Plan:   Problem List Items Addressed This Visit       Cardiovascular and Mediastinum   Hypertension - Primary    BP mildly elevated today. Continue current medications.         Endocrine   RESOLVED: DMII (diabetes mellitus, type 2) (HCC)   Relevant Orders   Hemoglobin A1c (Completed)     Genitourinary   CKD (chronic kidney disease) stage 4, GFR 15-29 ml/min (HCC)   Relevant Orders   Microalbumin / creatinine urine ratio (Completed)     Other   Positive colorectal cancer screening using Cologuard test    Referring to GI.      Relevant Orders   Ambulatory  referral to Gastroenterology   HLD (hyperlipidemia)    Well controlled. Continue Crestor.       Relevant Orders  Lipid panel (Completed)   Former smoker    Referral placed for Lung cancer screening.       Relevant Orders   Ambulatory Referral Lung Cancer Screening [facility]   Acute cough    Chest xray today.      Relevant Orders   DG Chest 2 View   Follow-up:  Return in about 3 months (around 04/09/2023).  [place]

## 2023-01-10 NOTE — Assessment & Plan Note (Signed)
Referral placed for Lung cancer screening.

## 2023-01-10 NOTE — Assessment & Plan Note (Signed)
BP mildly elevated today. Continue current medications.

## 2023-01-10 NOTE — Assessment & Plan Note (Signed)
Referring to GI.

## 2023-01-10 NOTE — Assessment & Plan Note (Signed)
Chest xray today.

## 2023-01-10 NOTE — Assessment & Plan Note (Signed)
Well controlled. Continue Crestor. 

## 2023-01-11 LAB — MICROALBUMIN / CREATININE URINE RATIO
Creatinine, Urine: 123 mg/dL
Microalb Creat Ratio: 192 mg/g creat — ABNORMAL HIGH (ref 0–29)
Microalb, Ur: 236 ug/mL — ABNORMAL HIGH

## 2023-01-15 ENCOUNTER — Other Ambulatory Visit: Payer: Self-pay

## 2023-01-15 ENCOUNTER — Emergency Department (HOSPITAL_COMMUNITY): Payer: Medicare HMO

## 2023-01-15 ENCOUNTER — Ambulatory Visit (HOSPITAL_BASED_OUTPATIENT_CLINIC_OR_DEPARTMENT_OTHER)
Admission: RE | Admit: 2023-01-15 | Discharge: 2023-01-15 | Disposition: A | Discharge status: Home / Self Care | Payer: Medicare HMO | Source: Ambulatory Visit | Attending: Physician Assistant | Admitting: Physician Assistant

## 2023-01-15 ENCOUNTER — Encounter (HOSPITAL_COMMUNITY): Payer: Self-pay

## 2023-01-15 ENCOUNTER — Emergency Department (HOSPITAL_COMMUNITY)
Admission: EM | Admit: 2023-01-15 | Discharge: 2023-01-15 | Disposition: A | Discharge status: Home / Self Care | Payer: Medicare HMO | Attending: Emergency Medicine | Admitting: Emergency Medicine

## 2023-01-15 DIAGNOSIS — R42 Dizziness and giddiness: Secondary | ICD-10-CM | POA: Insufficient documentation

## 2023-01-15 DIAGNOSIS — R9431 Abnormal electrocardiogram [ECG] [EKG]: Secondary | ICD-10-CM | POA: Insufficient documentation

## 2023-01-15 DIAGNOSIS — R634 Abnormal weight loss: Secondary | ICD-10-CM | POA: Diagnosis not present

## 2023-01-15 DIAGNOSIS — R4182 Altered mental status, unspecified: Secondary | ICD-10-CM | POA: Diagnosis not present

## 2023-01-15 DIAGNOSIS — R531 Weakness: Secondary | ICD-10-CM | POA: Diagnosis not present

## 2023-01-15 LAB — BASIC METABOLIC PANEL
Anion gap: 9 (ref 5–15)
BUN: 42 mg/dL — ABNORMAL HIGH (ref 8–23)
CO2: 21 mmol/L — ABNORMAL LOW (ref 22–32)
Calcium: 8.4 mg/dL — ABNORMAL LOW (ref 8.9–10.3)
Chloride: 103 mmol/L (ref 98–111)
Creatinine, Ser: 2.18 mg/dL — ABNORMAL HIGH (ref 0.44–1.00)
GFR, Estimated: 23 mL/min — ABNORMAL LOW (ref >60)
Glucose, Bld: 120 mg/dL — ABNORMAL HIGH (ref 70–99)
Potassium: 4.2 mmol/L (ref 3.5–5.1)
Sodium: 133 mmol/L — ABNORMAL LOW (ref 135–145)

## 2023-01-15 LAB — CBC WITH DIFFERENTIAL/PLATELET
Abs Immature Granulocytes: 0.02 10*3/uL (ref 0.00–0.07)
Basophils Absolute: 0 10*3/uL (ref 0.0–0.1)
Basophils Relative: 1 %
Eosinophils Absolute: 0 10*3/uL (ref 0.0–0.5)
Eosinophils Relative: 0 %
HCT: 34.6 % — ABNORMAL LOW (ref 36.0–46.0)
Hemoglobin: 10.8 g/dL — ABNORMAL LOW (ref 12.0–15.0)
Immature Granulocytes: 1 %
Lymphocytes Relative: 17 %
Lymphs Abs: 0.7 10*3/uL (ref 0.7–4.0)
MCH: 26.7 pg (ref 26.0–34.0)
MCHC: 31.2 g/dL (ref 30.0–36.0)
MCV: 85.6 fL (ref 80.0–100.0)
Monocytes Absolute: 0.5 10*3/uL (ref 0.1–1.0)
Monocytes Relative: 11 %
Neutro Abs: 2.9 10*3/uL (ref 1.7–7.7)
Neutrophils Relative %: 70 %
Platelets: 185 10*3/uL (ref 150–400)
RBC: 4.04 MIL/uL (ref 3.87–5.11)
RDW: 14.6 % (ref 11.5–15.5)
WBC: 4.1 10*3/uL (ref 4.0–10.5)
nRBC: 0 % (ref 0.0–0.2)

## 2023-01-15 LAB — ECHOCARDIOGRAM COMPLETE
AR max vel: 1.87 cm2
AV Area VTI: 1.78 cm2
AV Area mean vel: 1.82 cm2
AV Mean grad: 7 mmHg
AV Peak grad: 16 mmHg
Ao pk vel: 2 m/s
Area-P 1/2: 2.03 cm2
S' Lateral: 2.1 cm

## 2023-01-15 LAB — URINALYSIS, ROUTINE W REFLEX MICROSCOPIC
Bilirubin Urine: NEGATIVE
Glucose, UA: >=500 mg/dL — AB
Ketones, ur: NEGATIVE mg/dL
Leukocytes,Ua: NEGATIVE
Nitrite: NEGATIVE
Protein, ur: 30 mg/dL — AB
Specific Gravity, Urine: 1.01 (ref 1.005–1.030)
pH: 5 (ref 5.0–8.0)

## 2023-01-15 LAB — HEPATIC FUNCTION PANEL
ALT: 34 U/L (ref 0–44)
AST: 50 U/L — ABNORMAL HIGH (ref 15–41)
Albumin: 3.4 g/dL — ABNORMAL LOW (ref 3.5–5.0)
Alkaline Phosphatase: 55 U/L (ref 38–126)
Bilirubin, Direct: 0.1 mg/dL (ref 0.0–0.2)
Indirect Bilirubin: 0.5 mg/dL (ref 0.3–0.9)
Total Bilirubin: 0.6 mg/dL (ref 0.3–1.2)
Total Protein: 7.2 g/dL (ref 6.5–8.1)

## 2023-01-15 LAB — MAGNESIUM: Magnesium: 2.5 mg/dL — ABNORMAL HIGH (ref 1.7–2.4)

## 2023-01-15 LAB — CBG MONITORING, ED: Glucose-Capillary: 108 mg/dL — ABNORMAL HIGH (ref 70–99)

## 2023-01-15 NOTE — ED Triage Notes (Addendum)
Reports had diarrhea for a week and then wasn't eating and now is feeling dizzy and weak.  Family reports patient has had decreased appetitie x 2 weeks.  Denies n/v  Denies chest pain sob.  Diarrhea stopped approx 1 week ago.  Still making urine.  Patient just reports no energy fatigue and loss of apetite

## 2023-01-15 NOTE — ED Notes (Signed)
Food given to pt.

## 2023-01-15 NOTE — ED Provider Triage Note (Signed)
Emergency Medicine Provider Triage Evaluation Note  Deborah Gomez , a 77 y.o. female  was evaluated in triage.  Pt complains of weakness.  Pt reports feeling woozy. Pt reports she has had a poor appetite.  Pt reports losing weight.  Family states not eating for 2 weeks and 10 pd wt loss.  Pt had a fall recently but denies hitting her head   Review of Systems  Positive: Light headed Negative: fever  Physical Exam  BP 126/66 (BP Location: Right Arm)   Pulse 94   Temp 98.1 F (36.7 C) (Oral)   Resp 18   Ht '5\' 4"'$  (1.626 m)   Wt 69.9 kg   SpO2 98%   BMI 26.43 kg/m  Gen:   Awake, no distress   Resp:  Normal effort  MSK:   Moves extremities without difficulty  Other:    Medical Decision Making  Medically screening exam initiated at 2:39 PM.  Appropriate orders placed.  Deborah Gomez was informed that the remainder of the evaluation will be completed by another provider, this initial triage assessment does not replace that evaluation, and the importance of remaining in the ED until their evaluation is complete.     Deborah Gomez, [place] 01/15/23 1442

## 2023-01-15 NOTE — ED Notes (Signed)
Pt ate 75% of sandwhich and a few bites of applesauce with a whole can of ginger ale.

## 2023-01-15 NOTE — ED Provider Notes (Signed)
[facility] Provider Note   CSN: [id number] Arrival date & time: 01/15/23  1353     History  Chief Complaint  Patient presents with   Dizziness    Deborah Gomez is a 77 y.o. female.  Patient complains of feeling dizzy.  Patient reports she has not had a very good appetite for the last 2 weeks.  Family is concerned that patient has been losing weight.  Patient reports that she has been drinking plenty of fluids but she has not been eating solids.  Patient has a history of hypertension and chronic kidney disease.  Patient denies any fever or chills.  Patient denies any cough or congestion  The history is provided by the patient. No language interpreter was used.  Dizziness Quality:  Lightheadedness Severity:  Mild Onset quality:  Gradual Timing:  Constant Progression:  Worsening Chronicity:  New Relieved by:  Nothing Worsened by:  Nothing Ineffective treatments:  None tried Associated symptoms: nausea        Home Medications Prior to Admission medications   Medication Sig Start Date End Date Taking? Authorizing Provider  aspirin EC 81 MG tablet Take 81 mg by mouth daily. Swallow whole.    [provider]  calcitRIOL (ROCALTROL) 0.25 MCG capsule Take 0.25 mcg by mouth daily.    [provider]  empagliflozin (JARDIANCE) 10 MG TABS tablet Take 1 tablet (10 mg total) by mouth daily. 11/20/22   Coral Spikes, DO  furosemide (LASIX) 20 MG tablet Take 20 mg by mouth 2 (two) times daily.    [provider]  gabapentin (NEURONTIN) 100 MG capsule Take 1 capsule (100 mg total) by mouth 3 (three) times daily. 11/08/22   Coral Spikes, DO  hydrALAZINE (APRESOLINE) 50 MG tablet Take 1.5 tablets (75 mg total) by mouth 3 (three) times daily. 12/22/21 12/25/22  Arnoldo Lenis, MD  losartan (COZAAR) 50 MG tablet Take 50 mg by mouth daily. 11/02/21   [provider]  NIFEdipine (PROCARDIA XL/NIFEDICAL-XL) 90 MG  24 hr tablet Take 90 mg by mouth daily. 11/02/21   [provider]  rosuvastatin (CRESTOR) 40 MG tablet Take 1 tablet (40 mg total) by mouth daily. 11/20/22   Coral Spikes, DO      Allergies    Patient has no known allergies.    Review of Systems   Review of Systems  Gastrointestinal:  Positive for nausea.  Neurological:  Positive for dizziness.  All other systems reviewed and are negative.   Physical Exam Updated Vital Signs BP (!) 115/104   Pulse 83   Temp 98.6 F (37 C) (Oral)   Resp 18   Ht '5\' 4"'$  (1.626 m)   Wt 69.9 kg   SpO2 97%   BMI 26.43 kg/m  Physical Exam Vitals and nursing note reviewed.  Constitutional:      Appearance: She is well-developed.  HENT:     Head: Normocephalic.     Nose: Nose normal.     Mouth/Throat:     Mouth: Mucous membranes are moist.  Eyes:     Extraocular Movements: Extraocular movements intact.     Pupils: Pupils are equal, round, and reactive to light.  Cardiovascular:     Rate and Rhythm: Normal rate and regular rhythm.  Pulmonary:     Effort: Pulmonary effort is normal.  Abdominal:     General: Abdomen is flat. There is no distension.     Palpations: Abdomen is soft.  Musculoskeletal:        General: Normal range of motion.     Cervical back: Normal range of motion.  Skin:    General: Skin is warm.  Neurological:     General: No focal deficit present.     Mental Status: She is alert and oriented to person, place, and time.     ED Results / Procedures / Treatments   Labs (all labs ordered are listed, but only abnormal results are displayed) Labs Reviewed  BASIC METABOLIC PANEL - Abnormal; Notable for the following components:      Result Value   Sodium 133 (*)    CO2 21 (*)    Glucose, Bld 120 (*)    BUN 42 (*)    Creatinine, Ser 2.18 (*)    Calcium 8.4 (*)    GFR, Estimated 23 (*)    All other components within normal limits  MAGNESIUM - Abnormal; Notable for the following components:   Magnesium 2.5  (*)    All other components within normal limits  CBC WITH DIFFERENTIAL/PLATELET - Abnormal; Notable for the following components:   Hemoglobin 10.8 (*)    HCT 34.6 (*)    All other components within normal limits  HEPATIC FUNCTION PANEL - Abnormal; Notable for the following components:   Albumin 3.4 (*)    AST 50 (*)    All other components within normal limits  CBG MONITORING, ED - Abnormal; Notable for the following components:   Glucose-Capillary 108 (*)    All other components within normal limits  URINALYSIS, ROUTINE W REFLEX MICROSCOPIC    EKG None  Radiology CT Head Wo Contrast  Result Date: 01/15/2023 CLINICAL DATA:  Altered mental status.  Recent fall. EXAM: CT HEAD WITHOUT CONTRAST TECHNIQUE: Contiguous axial images were obtained from the base of the skull through the vertex without intravenous contrast. RADIATION DOSE REDUCTION: This exam was performed according to the departmental dose-optimization program which includes automated exposure control, adjustment of the mA and/or kV according to patient size and/or use of iterative reconstruction technique. COMPARISON:  Head CT 05/15/2021. FINDINGS: Brain: No acute hemorrhage. Unchanged chronic small-vessel disease. Cortical gray-white differentiation is otherwise preserved. Prominence of the ventricles and sulci within normal limits for age. No extra-axial collection. Basilar cisterns are patent. Vascular: No hyperdense vessel or unexpected calcification. Skull: No calvarial fracture or suspicious bone lesion. Skull base is unremarkable. Sinuses/Orbits: Unremarkable. Other: None. IMPRESSION: 1. No acute intracranial abnormality. 2. Unchanged chronic small-vessel disease. Electronically Signed   By: Emmit Alexanders M.D.   On: 01/15/2023 15:14   ECHOCARDIOGRAM COMPLETE  Result Date: 01/15/2023    ECHOCARDIOGRAM REPORT   Patient Name:   Deborah Gomez Date of Exam: 01/15/2023 Medical Rec #:  [id number]       Height:       64.0 in  Accession #:    [id number]      Weight:       154.0 lb Date of Birth:  04-04-1946       BSA:          1.751 m Patient Age:    50 years        BP:           119/68 mmHg Patient Gender: F               HR:           87 bpm. Exam Location:  [facility] Procedure: 2D Echo, Cardiac Doppler and Color Doppler Indications:  R94.31 (ICD-10-CM) - Abnormal EKG  History:        Patient has prior history of Echocardiogram examinations, most                 recent 08/03/2021. Risk Factors:Hypertension, Diabetes,                 Dyslipidemia and Former Smoker. CKD (chronic kidney disease)                 stage 4.  Sonographer:    Alvino Chapel RCS Referring Phys: [phone] [place]  1. Left ventricular ejection fraction, by estimation, is 65 to 70%. The left ventricle has normal function. The left ventricle has no regional wall motion abnormalities. There is moderate left ventricular hypertrophy. Left ventricular diastolic parameters are consistent with Grade I diastolic dysfunction (impaired relaxation).  2. Right ventricular systolic function is normal. The right ventricular size is normal. There is normal pulmonary artery systolic pressure.  3. The mitral valve is normal in structure. No evidence of mitral valve regurgitation. No evidence of mitral stenosis.  4. The tricuspid valve is abnormal.  5. The aortic valve is tricuspid. There is mild calcification of the aortic valve. There is mild thickening of the aortic valve. Aortic valve regurgitation is not visualized. No aortic stenosis is present.  6. The inferior vena cava is normal in size with greater than 50% respiratory variability, suggesting right atrial pressure of 3 mmHg. FINDINGS  Left Ventricle: Left ventricular ejection fraction, by estimation, is 65 to 70%. The left ventricle has normal function. The left ventricle has no regional wall motion abnormalities. The left ventricular internal cavity size was normal in size. There is  moderate left ventricular  hypertrophy. Left ventricular diastolic parameters are consistent with Grade I diastolic dysfunction (impaired relaxation). Normal left ventricular filling pressure. Right Ventricle: The right ventricular size is normal. Right vetricular wall thickness was not well visualized. Right ventricular systolic function is normal. There is normal pulmonary artery systolic pressure. The tricuspid regurgitant velocity is 2.39 m/s, and with an assumed right atrial pressure of 3 mmHg, the estimated right ventricular systolic pressure is 0000000 mmHg. Left Atrium: Left atrial size was normal in size. Right Atrium: Right atrial size was normal in size. Pericardium: There is no evidence of pericardial effusion. Mitral Valve: The mitral valve is normal in structure. No evidence of mitral valve regurgitation. No evidence of mitral valve stenosis. Tricuspid Valve: The tricuspid valve is abnormal. Tricuspid valve regurgitation is mild . No evidence of tricuspid stenosis. Aortic Valve: The aortic valve is tricuspid. There is mild calcification of the aortic valve. There is mild thickening of the aortic valve. There is mild aortic valve annular calcification. Aortic valve regurgitation is not visualized. No aortic stenosis  is present. Aortic valve mean gradient measures 7.0 mmHg. Aortic valve peak gradient measures 16.0 mmHg. Aortic valve area, by VTI measures 1.78 cm. Pulmonic Valve: The pulmonic valve was not well visualized. Pulmonic valve regurgitation is mild. No evidence of pulmonic stenosis. Aorta: The aortic root is normal in size and structure. Venous: The inferior vena cava is normal in size with greater than 50% respiratory variability, suggesting right atrial pressure of 3 mmHg. IAS/Shunts: No atrial level shunt detected by color flow Doppler.  LEFT VENTRICLE PLAX 2D LVIDd:         3.60 cm   Diastology LVIDs:         2.10 cm   LV e' medial:    5.00  cm/s LV PW:         1.30 cm   LV E/e' medial:  12.7 LV IVS:        1.40 cm    LV e' lateral:   7.94 cm/s LVOT diam:     1.80 cm   LV E/e' lateral: 8.0 LV SV:         62 LV SV Index:   35 LVOT Area:     2.54 cm  RIGHT VENTRICLE RV S prime:     13.70 cm/s TAPSE (M-mode): 1.5 cm LEFT ATRIUM             Index        RIGHT ATRIUM           Index LA diam:        3.60 cm 2.06 cm/m   RA Area:     15.70 cm LA Vol (A2C):   38.0 ml 21.71 ml/m  RA Volume:   39.60 ml  22.62 ml/m LA Vol (A4C):   32.4 ml 18.51 ml/m LA Biplane Vol: 35.4 ml 20.22 ml/m  AORTIC VALVE AV Area (Vmax):    1.87 cm AV Area (Vmean):   1.82 cm AV Area (VTI):     1.78 cm AV Vmax:           200.00 cm/s AV Vmean:          123.000 cm/s AV VTI:            0.348 m AV Peak Grad:      16.0 mmHg AV Mean Grad:      7.0 mmHg LVOT Vmax:         147.00 cm/s LVOT Vmean:        87.800 cm/s LVOT VTI:          0.244 m LVOT/AV VTI ratio: 0.70  AORTA Ao Root diam: 3.10 cm MITRAL VALVE                TRICUSPID VALVE MV Area (PHT): 2.03 cm     TR Peak grad:   22.8 mmHg MV Decel Time: 373 msec     TR Vmax:        239.00 cm/s MV E velocity: 63.50 cm/s MV A velocity: 108.00 cm/s  SHUNTS MV E/A ratio:  0.59         Systemic VTI:  0.24 m                             Systemic Diam: 1.80 cm Carlyle Dolly MD Electronically signed by Carlyle Dolly MD Signature Date/Time: 01/15/2023/3:12:50 PM    Final    DG Chest 2 View  Result Date: 01/15/2023 CLINICAL DATA:  Weakness, dizziness. EXAM: CHEST - 2 VIEW COMPARISON:  None Available. FINDINGS: The heart size and mediastinal contours are within normal limits. Both lungs are clear. The visualized skeletal structures are unremarkable. IMPRESSION: No active cardiopulmonary disease. Electronically Signed   By: Marijo Conception M.D.   On: 01/15/2023 15:01    Procedures Procedures    Medications Ordered in ED Medications - No data to display  ED Course/ Medical Decision Making/ A&P Clinical Course as of 01/15/23 1850  Tue Jan 15, 2023  [id number] Basic metabolic panel(!) [LS]    Clinical Course User  Index [LS] Fransico Meadow, [place]  Medical Decision Making Patient complains of general weakness poor appetite.  Patient's family is concerned that patient is losing weight.  Patient reports patient did have some diarrhea but that resolved over a week ago.  Amount and/or Complexity of Data Reviewed External Data Reviewed: labs.    Details:  labs are reviewed Labs: ordered. Decision-making details documented in ED Course.    Details: Abs ordered reviewed and interpreted BUN is 42.  Creatinine is 2.18.  Magnesium is 2.5.  Hemoglobin is 10.8. Radiology: ordered and independent interpretation performed. Decision-making details documented in ED Course.    Details: CT head no acute abnormality Chest x-ray no acute abnormality ECG/medicine tests: ordered and independent interpretation performed. Decision-making details documented in ED Course.    Details: No acute  Risk Risk Details: Patient reports she is feeling hungry.  Patient ate a sandwich applesauce and drink a ginger ale.           Final Clinical Impression(s) / ED Diagnoses Final diagnoses:  Lightheadedness  Weight loss    Rx / DC Orders ED Discharge Orders     None      An After Visit Summary was printed and given to the patient.    Fransico Meadow, Hershal Coria 01/15/23 Allena Katz, MD 01/18/23 [phone]

## 2023-01-15 NOTE — Discharge Instructions (Addendum)
Make sure to eat and drink.  Schedule to see your Physician for recheck in 2-3 days

## 2023-01-15 NOTE — Progress Notes (Signed)
*  PRELIMINARY RESULTS* Echocardiogram 2D Echocardiogram has been performed.  Deborah Gomez 01/15/2023, 1:48 PM

## 2023-01-18 ENCOUNTER — Ambulatory Visit (HOSPITAL_COMMUNITY)
Admission: RE | Admit: 2023-01-18 | Discharge: 2023-01-18 | Disposition: A | Discharge status: Home / Self Care | Payer: Medicare HMO | Source: Ambulatory Visit | Attending: Family Medicine | Admitting: Family Medicine

## 2023-01-18 ENCOUNTER — Ambulatory Visit (INDEPENDENT_AMBULATORY_CARE_PROVIDER_SITE_OTHER): Payer: Medicare HMO | Admitting: Family Medicine

## 2023-01-18 DIAGNOSIS — R1084 Generalized abdominal pain: Secondary | ICD-10-CM | POA: Diagnosis not present

## 2023-01-18 DIAGNOSIS — R634 Abnormal weight loss: Secondary | ICD-10-CM | POA: Insufficient documentation

## 2023-01-18 DIAGNOSIS — K5730 Diverticulosis of large intestine without perforation or abscess without bleeding: Secondary | ICD-10-CM | POA: Diagnosis not present

## 2023-01-18 NOTE — Progress Notes (Signed)
Subjective:  Patient ID: Deborah Gomez, female    DOB: Apr 10, 1946  Age: 77 y.o. MRN: [id number]  CC: Chief Complaint  Patient presents with   light headed ER follow up     X weeks , no appetite just sipping water and juice  Cough with chest congestion taking tylenol cold and flu    HPI:  77 year old female with the below mentioned medical problems presents for follow-up.  Patient recently seen in the ER for lightheadedness and dizziness.  Patient was evaluated thoroughly and discharged home.  CT scan of the head was negative.  Chest x-ray clear.  Labs stable.  Patient reports that she feels very poorly.  Significant fatigue and poor appetite.  She has not eaten anything today.  Upon review of the EMR, she has lost a significant mount of weight.  Her weight in August was 170 pounds.  She is currently 148 pounds.  She is very fatigued and does not feel well.  She is very concerned.  No reports of hematochezia or melena.  She is followed closely by nephrology.   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   Anemia of chronic disease 01/09/2022   Goiter 01/09/2022   Positive colorectal cancer screening using Cologuard test 12/19/2021   HLD (hyperlipidemia) 08/16/2021   Vitamin D deficiency 06/06/2021   Hypertension 05/15/2021    Social Hx   Social History   Socioeconomic History   Marital status: Significant Other    Spouse name: Not on file   Number of children: 2   Years of education: Not on file   Highest education level: Not on file  Occupational History   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  Tobacco Use   Smoking status: Former    Packs/day: 0.50    Years: 52.00     Total pack years: 26.00    Types: Cigarettes    Quit date: 08/21/2021    Years since quitting: 1.4   Smokeless tobacco: Never  Vaping Use   Vaping Use: Never used  Substance and Sexual Activity   Alcohol use: Not Currently   Drug use: Never   Sexual activity: Yes    Birth control/protection: Post-menopausal  Other Topics Concern   Not on file  Social History Narrative   1 child in [place], the other is not nearby   Social Determinants of Health   Financial Resource Strain: Low Risk  (08/22/2022)   Overall Financial Resource Strain (CARDIA)    Difficulty of Paying Living Expenses: Not hard at all  Food Insecurity: No Food Insecurity (08/22/2022)   Hunger Vital Sign    Worried About Running Out of Food in the Last Year: Never true    Ran Out of Food in the Last Year: Never true  Transportation Needs: No Transportation Needs (08/22/2022)   PRAPARE - Hydrologist (Medical): No    Lack of Transportation (Non-Medical): No  Physical Activity: Insufficiently Active (08/22/2022)   Exercise Vital Sign    Days of Exercise per Week: 3 days    Minutes of Exercise per Session: 30 min  Stress: No Stress Concern Present (08/22/2022)   [place]  Stress Questionnaire    Feeling of Stress : Not at all  Social Connections: Socially Isolated (08/22/2022)   Social Connection and Isolation Panel [NHANES]    Frequency of Communication with Friends and Family: Twice a week    Frequency of Social Gatherings with Friends and Family: Once a week    Attends Religious Services: Never    Marine scientist or Organizations: No    Attends Archivist Meetings: Never    Marital Status: Widowed    Review of Systems Per HPI  Objective:  BP 130/70   Pulse (!) 104   Temp 98.2 F (36.8 C)   Ht '5\' 4"'$  (1.626 m)   Wt 148 lb (67.1 kg)   SpO2 100%   BMI 25.40 kg/m      01/18/2023    3:55 PM 01/15/2023    7:19 PM  01/15/2023    6:19 PM  BP/Weight  Systolic BP [id number]  Diastolic BP 70 58 123456  Wt. (Lbs) 148    BMI 25.4 kg/m2      Physical Exam Vitals and nursing note reviewed.  Constitutional:      Comments: Appears fatigued.  HENT:     Head: Normocephalic and atraumatic.  Eyes:     General:        Right eye: No discharge.        Left eye: No discharge.     Conjunctiva/sclera: Conjunctivae normal.  Cardiovascular:     Rate and Rhythm: Normal rate and regular rhythm.  Pulmonary:     Effort: Pulmonary effort is normal.     Breath sounds: No wheezing or rales.  Abdominal:     General: There is no distension.     Palpations: Abdomen is soft.     Comments: Mild tenderness on exam.  Neurological:     Mental Status: She is alert.  Psychiatric:        Mood and Affect: Mood normal.        Behavior: Behavior normal.     Lab Results  Component Value Date   WBC 4.1 01/15/2023   HGB 10.8 (L) 01/15/2023   HCT 34.6 (L) 01/15/2023   PLT 185 01/15/2023   GLUCOSE 120 (H) 01/15/2023   CHOL 98 01/09/2023   TRIG 96 01/09/2023   HDL 44 01/09/2023   LDLCALC 35 01/09/2023   ALT 34 01/15/2023   AST 50 (H) 01/15/2023   NA 133 (L) 01/15/2023   K 4.2 01/15/2023   CL 103 01/15/2023   CREATININE 2.18 (H) 01/15/2023   BUN 42 (H) 01/15/2023   CO2 21 (L) 01/15/2023   TSH 0.689 12/25/2022   HGBA1C 5.9 (H) 01/09/2023   MICROALBUR 236.0 (H) 01/09/2023     Assessment & Plan:   Problem List Items Addressed This Visit       Other   Unintentional weight loss    Patient with significant weight loss. Has had a positive Cologuard and did not have colonoscopy. Needs CT scan of the abdomen and pelvis for further evaluation.  Concern for malignancy.      Relevant Orders   CT Abdomen Pelvis Wo Contrast   Other Visit Diagnoses     Generalized abdominal pain       Relevant Orders   CT Abdomen Pelvis Wo Contrast       [place]

## 2023-01-18 NOTE — Assessment & Plan Note (Signed)
Patient with significant weight loss. Has had a positive Cologuard and did not have colonoscopy. Needs CT scan of the abdomen and pelvis for further evaluation.  Concern for malignancy.

## 2023-01-21 ENCOUNTER — Other Ambulatory Visit: Payer: Self-pay | Admitting: Family Medicine

## 2023-01-21 DIAGNOSIS — J479 Bronchiectasis, uncomplicated: Secondary | ICD-10-CM

## 2023-01-22 NOTE — Progress Notes (Signed)
Cardiology Office Note:    Date:  02/05/2023   ID:  Burnard Bunting, DOB July 03, 1946, MRN [id number]  PCP:  Coral Spikes, DO  [facility] Cardiologist:  Carlyle Dolly, MD     Referring MD: Coral Spikes, DO   Chief Complaint:  Follow-up     History of Present Illness:   Deborah Gomez is a 77 y.o. female with   history of HTN, HLD, CKD stage IV followed by Dr. Theador Hawthorne, DM, diastolic dysfunction, mild MR, aortic sclerosis and LVH Prior renal artery duplex 06/2021 showed 1-59% stenosis, no obstruction. 2D Echo 07/2021 EF 65-70%, severe asymmetric LVH of the septal segment, G1DD, small pericardial effusion, mild MR, aortic sclerosis without stenosis. Continue medical therapy with BP control was recommended, no evidence of heart failure at the time. Her hydralazine was increased to 75mg  TID at that visit.Prior renal artery duplex 06/2021 showed 1-59% stenosis, no obstruction.    Patient last seen by Ms. Dunn 12/25/22 hydralazine increased 100 mg tid for HTN and echo ordered. EKG abnormal narrow Q waves III V3-V6. Echo normal LVEF 65-70% mod LVH grade 1DD  Patient in ED 01/15/23 with general weakness poor appetite and weight loss. Crt 2.18.   Patient comes in for f/u. Denies chest pain, palpitations, dyspnea, dizziness or edema. She feels well and BP readings from home all normal.     Past Medical History:  Diagnosis Date   Aortic valve sclerosis    Chronic kidney disease    CKD (chronic kidney disease), stage IV (HCC)    Diabetes mellitus (HCC)    Diastolic dysfunction    Hypertension    LVH (left ventricular hypertrophy)    Current Medications: Current Meds  Medication Sig   aspirin EC 81 MG tablet Take 81 mg by mouth daily. Swallow whole.   calcitRIOL (ROCALTROL) 0.25 MCG capsule Take 0.25 mcg by mouth daily.   empagliflozin (JARDIANCE) 10 MG TABS tablet Take 1 tablet (10 mg total) by mouth daily.   furosemide (LASIX) 20 MG tablet Take 20 mg by mouth 2 (two)  times daily.   gabapentin (NEURONTIN) 100 MG capsule Take 1 capsule (100 mg total) by mouth 3 (three) times daily.   hydrALAZINE (APRESOLINE) 50 MG tablet Take 1.5 tablets (75 mg total) by mouth 3 (three) times daily.   losartan (COZAAR) 50 MG tablet Take 50 mg by mouth daily.   NIFEdipine (PROCARDIA XL/NIFEDICAL-XL) 90 MG 24 hr tablet Take 90 mg by mouth daily.   rosuvastatin (CRESTOR) 40 MG tablet Take 1 tablet (40 mg total) by mouth daily.    Allergies:   Patient has no known allergies.   Social History   Tobacco Use   Smoking status: Every Day    Packs/day: 0.50    Years: 52.00    Additional pack years: 0.00    Total pack years: 26.00    Types: Cigarettes    Last attempt to quit: 08/21/2021    Years since quitting: 1.4   Smokeless tobacco: Never   Tobacco comments:    Smokes about 4 ciggs per day   Vaping Use   Vaping Use: Never used  Substance Use Topics   Alcohol use: Not Currently   Drug use: Never    Family Hx: The patient's family history is negative for Colon cancer and Colon polyps. She was adopted.  ROS     Physical Exam:    VS:  BP (!) 150/80   Pulse 99   Ht 5\' 4"  (1.626  m)   Wt 152 lb 9.6 oz (69.2 kg)   SpO2 97%   BMI 26.19 kg/m     Wt Readings from Last 3 Encounters:  02/05/23 152 lb 9.6 oz (69.2 kg)  01/18/23 148 lb (67.1 kg)  01/15/23 154 lb (69.9 kg)    Physical Exam  GEN: Well nourished, well developed, in no acute distress  Neck: no JVD, carotid bruits, or masses Cardiac:RRR; 1/6 systolic murmur LSB Respiratory:  clear to auscultation bilaterally, normal work of breathing GI: soft, nontender, nondistended, + BS Ext: without cyanosis, clubbing, or edema, Good distal pulses bilaterally Neuro:  Alert and Oriented x 3,  Psych: euthymic mood, full affect        EKGs/Labs/Other Test Reviewed:    EKG:  EKG is  not ordered today.   Recent Labs: 12/25/2022: TSH 0.689 01/15/2023: ALT 34; BUN 42; Creatinine, Ser 2.18; Hemoglobin 10.8;  Magnesium 2.5; Platelets 185; Potassium 4.2; Sodium 133   Recent Lipid Panel Recent Labs    01/09/23 1218  CHOL 98  TRIG 96  HDL 44  VLDL 19  LDLCALC 35     Prior CV Studies:   Echo 01/15/23 IMPRESSIONS     1. Left ventricular ejection fraction, by estimation, is 65 to 70%. The  left ventricle has normal function. The left ventricle has no regional  wall motion abnormalities. There is moderate left ventricular hypertrophy.  Left ventricular diastolic  parameters are consistent with Grade I diastolic dysfunction (impaired  relaxation).   2. Right ventricular systolic function is normal. The right ventricular  size is normal. There is normal pulmonary artery systolic pressure.   3. The mitral valve is normal in structure. No evidence of mitral valve  regurgitation. No evidence of mitral stenosis.   4. The tricuspid valve is abnormal.   5. The aortic valve is tricuspid. There is mild calcification of the  aortic valve. There is mild thickening of the aortic valve. Aortic valve  regurgitation is not visualized. No aortic stenosis is present.   6. The inferior vena cava is normal in size with greater than 50%  respiratory variability, suggesting right atrial pressure of 3 mmHg.   FINDINGS   Left Ventricle: Left ventricular ejection fraction, by estimation, is 65  to 70%. The left ventricle has normal function. The left ventricle has no  regional wall motion abnormalities. The left ventricular internal cavity  size was normal in size. There is   moderate left ventricular hypertrophy. Left ventricular diastolic  parameters are consistent with Grade I diastolic dysfunction (impaired  relaxation). Normal left ventricular filling pressure.   Right Ventricle: The right ventricular size is normal. Right vetricular  wall thickness was not well visualized. Right ventricular systolic  function is normal. There is normal pulmonary artery systolic pressure.  The tricuspid regurgitant  velocity is 2.39  m/s, and with an assumed right atrial pressure of 3 mmHg, the estimated  right ventricular systolic pressure is 95.6 mmHg.   Left Atrium: Left atrial size was normal in size.   Right Atrium: Right atrial size was normal in size.   Pericardium: There is no evidence of pericardial effusion.   Mitral Valve: The mitral valve is normal in structure. No evidence of  mitral valve regurgitation. No evidence of mitral valve stenosis.   Tricuspid Valve: The tricuspid valve is abnormal. Tricuspid valve  regurgitation is mild . No evidence of tricuspid stenosis.   Aortic Valve: The aortic valve is tricuspid. There is mild calcification  of the  aortic valve. There is mild thickening of the aortic valve. There  is mild aortic valve annular calcification. Aortic valve regurgitation is  not visualized. No aortic stenosis   is present. Aortic valve mean gradient measures 7.0 mmHg. Aortic valve  peak gradient measures 16.0 mmHg. Aortic valve area, by VTI measures 1.78  cm.   Pulmonic Valve: The pulmonic valve was not well visualized. Pulmonic valve  regurgitation is mild. No evidence of pulmonic stenosis.   Aorta: The aortic root is normal in size and structure.   Venous: The inferior vena cava is normal in size with greater than 50%  respiratory variability, suggesting right atrial pressure of 3 mmHg.   IAS/Shunts: No atrial level shunt detected by color flow Doppler.     Risk Assessment/Calculations/Metrics:         HYPERTENSION CONTROL Vitals:   02/05/23 1259 02/05/23 1329  BP: (!) 148/68 (!) 150/80    The patient's blood pressure is elevated above target today.  In order to address the patient's elevated BP: Blood pressure will be monitored at home to determine if medication changes need to be made.; The blood pressure is usually elevated in clinic.  Blood pressures monitored at home have been optimal.       ASSESSMENT & PLAN:   No problem-specific Assessment  & Plan notes found for this encounter.   Chronic diastolic dysfunction with LVH-well compensated today. Continue lasix, hydralazine, losartan, nifedipine  Abn EKG with narrow Q waves inf V3-V6 f/u echo normal LVEF with no WMA-no symptoms  Small pericardial effusion 2022 not on f/u ectho  HTN-BP up today but readings from home all normal. Will not make changes today.   CKD-followed by Dr. Theador Hawthorne and was seen yest and felt to be stable. To start epogen injections.            Dispo:  No follow-ups on file.   Medication Adjustments/Labs and Tests Ordered: Current medicines are reviewed at length with the patient today.  Concerns regarding medicines are outlined above.  Tests Ordered: No orders of the defined types were placed in this encounter.  Medication Changes: No orders of the defined types were placed in this encounter.  Signed, Ermalinda Barrios, PA-C  02/05/2023 1:32 PM    [facility] [address], [place], [place]  [address] Phone: [phone]; Fax: [phone]

## 2023-01-23 ENCOUNTER — Telehealth: Payer: Self-pay

## 2023-01-23 NOTE — Telephone Encounter (Signed)
        Patient  visited [facility] on 2/27    Telephone encounter attempt :  1st  A HIPAA compliant voice message was left requesting a return call.  Instructed patient to call back     [facility] [phone] [address], [place], [place] [address] Phone: [phone] Email: [email or web address]

## 2023-01-24 ENCOUNTER — Telehealth: Payer: Self-pay

## 2023-01-24 NOTE — Telephone Encounter (Signed)
        Patient  visited [facility] on 2/27    Telephone encounter attempt :  2nd  A HIPAA compliant voice message was left requesting a return call.  Instructed patient to call back    [facility] [phone] [address], [place], [place] [address] Phone: [phone] Email: [email or web address]

## 2023-01-30 DIAGNOSIS — R808 Other proteinuria: Secondary | ICD-10-CM | POA: Diagnosis not present

## 2023-01-30 DIAGNOSIS — N170 Acute kidney failure with tubular necrosis: Secondary | ICD-10-CM | POA: Diagnosis not present

## 2023-01-30 DIAGNOSIS — E211 Secondary hyperparathyroidism, not elsewhere classified: Secondary | ICD-10-CM | POA: Diagnosis not present

## 2023-01-30 DIAGNOSIS — I5032 Chronic diastolic (congestive) heart failure: Secondary | ICD-10-CM | POA: Diagnosis not present

## 2023-01-30 DIAGNOSIS — D638 Anemia in other chronic diseases classified elsewhere: Secondary | ICD-10-CM | POA: Diagnosis not present

## 2023-01-30 DIAGNOSIS — I129 Hypertensive chronic kidney disease with stage 1 through stage 4 chronic kidney disease, or unspecified chronic kidney disease: Secondary | ICD-10-CM | POA: Diagnosis not present

## 2023-01-30 DIAGNOSIS — E1122 Type 2 diabetes mellitus with diabetic chronic kidney disease: Secondary | ICD-10-CM | POA: Diagnosis not present

## 2023-01-30 DIAGNOSIS — N189 Chronic kidney disease, unspecified: Secondary | ICD-10-CM | POA: Diagnosis not present

## 2023-02-04 DIAGNOSIS — R808 Other proteinuria: Secondary | ICD-10-CM | POA: Diagnosis not present

## 2023-02-04 DIAGNOSIS — E211 Secondary hyperparathyroidism, not elsewhere classified: Secondary | ICD-10-CM | POA: Diagnosis not present

## 2023-02-04 DIAGNOSIS — E1122 Type 2 diabetes mellitus with diabetic chronic kidney disease: Secondary | ICD-10-CM | POA: Diagnosis not present

## 2023-02-04 DIAGNOSIS — I129 Hypertensive chronic kidney disease with stage 1 through stage 4 chronic kidney disease, or unspecified chronic kidney disease: Secondary | ICD-10-CM | POA: Diagnosis not present

## 2023-02-04 DIAGNOSIS — N189 Chronic kidney disease, unspecified: Secondary | ICD-10-CM | POA: Diagnosis not present

## 2023-02-04 DIAGNOSIS — I5032 Chronic diastolic (congestive) heart failure: Secondary | ICD-10-CM | POA: Diagnosis not present

## 2023-02-04 DIAGNOSIS — D638 Anemia in other chronic diseases classified elsewhere: Secondary | ICD-10-CM | POA: Diagnosis not present

## 2023-02-05 ENCOUNTER — Ambulatory Visit: Payer: Medicare HMO | Attending: Physician Assistant | Admitting: Physician Assistant

## 2023-02-05 ENCOUNTER — Encounter: Payer: Self-pay | Admitting: Physician Assistant

## 2023-02-05 VITALS — BP 150/80 | HR 99 | Ht 64.0 in | Wt 152.6 lb

## 2023-02-05 DIAGNOSIS — I3139 Other pericardial effusion (noninflammatory): Secondary | ICD-10-CM | POA: Diagnosis not present

## 2023-02-05 DIAGNOSIS — N184 Chronic kidney disease, stage 4 (severe): Secondary | ICD-10-CM

## 2023-02-05 DIAGNOSIS — I10 Essential (primary) hypertension: Secondary | ICD-10-CM

## 2023-02-05 DIAGNOSIS — I5032 Chronic diastolic (congestive) heart failure: Secondary | ICD-10-CM | POA: Diagnosis not present

## 2023-02-05 DIAGNOSIS — R9431 Abnormal electrocardiogram [ECG] [EKG]: Secondary | ICD-10-CM | POA: Diagnosis not present

## 2023-02-05 NOTE — Patient Instructions (Signed)
Medication Instructions:  Your physician recommends that you continue on your current medications as directed. Please refer to the Current Medication list given to you today.  *If you need a refill on your cardiac medications before your next appointment, please call your pharmacy*   Lab Work: NONE  If you have labs (blood work) drawn today and your tests are completely normal, you will receive your results only by: [facility] (if you have MyChart) OR A paper copy in the mail If you have any lab test that is abnormal or we need to change your treatment, we will call you to review the results.   Testing/Procedures: NONE    Follow-Up: At [facility], you and your health needs are our priority.  As part of our continuing mission to provide you with exceptional heart care, we have created designated Provider Care Teams.  These Care Teams include your primary Cardiologist (physician) and Advanced Practice Providers (APPs -  Physician Assistants and Nurse Practitioners) who all work together to provide you with the care you need, when you need it.  We recommend signing up for the patient portal called "MyChart".  Sign up information is provided on this After Visit Summary.  MyChart is used to connect with patients for Virtual Visits (Telemedicine).  Patients are able to view lab/test results, encounter notes, upcoming appointments, etc.  Non-urgent messages can be sent to your provider as well.   To learn more about what you can do with MyChart, go to [email or web address].    Your next appointment:   6 month(s)  Provider:   You may see Carlyle Dolly, MD or one of the following Advanced Practice Providers on your designated Care Team:   Bernerd Pho, PA-C  Ermalinda Barrios, [place]     Other Instructions Thank you for choosing [facility]!

## 2023-02-06 ENCOUNTER — Ambulatory Visit: Payer: Medicare HMO | Admitting: Internal Medicine

## 2023-02-06 ENCOUNTER — Encounter: Payer: Self-pay | Admitting: Internal Medicine

## 2023-02-06 VITALS — BP 124/64 | HR 97 | Temp 98.4°F | Ht 64.0 in | Wt 152.8 lb

## 2023-02-06 DIAGNOSIS — F17210 Nicotine dependence, cigarettes, uncomplicated: Secondary | ICD-10-CM | POA: Diagnosis not present

## 2023-02-06 DIAGNOSIS — J479 Bronchiectasis, uncomplicated: Secondary | ICD-10-CM

## 2023-02-06 NOTE — Assessment & Plan Note (Signed)
Active smoker - mild bronchiectasis on CT ad 01/15/23   Likely result of infection as child as have no prior CT's but needs pfts to complete the w/u and HRCT eventually to fully assess the severity but other than breathing clean air I have no additonal recs for rx at this point  Advised:   I reviewed the Fletcher curve with the patient that basically indicates  if you quit smoking when your best day FEV1 is still well preserved (as is likely still   the case here)  it is highly unlikely you will progress to severe disease and informed the patient there was  no medication on the market that has proven to alter the curve/ its downward trajectory  or the likelihood of progression of their disease(unlike other chronic medical conditions such as atheroclerosis where we do think we can change the natural hx with risk reducing meds)    Therefore stopping smoking and maintaining abstinence are  the most important aspects of her care, not choice of inhalers or for that matter, pulmonary doctors.   Treatment other than smoking cessation  is entirely directed by severity of symptoms and focused also on reducing exacerbations, not attempting to change the natural history of the disease.

## 2023-02-06 NOTE — Progress Notes (Signed)
Deborah Gomez, female    DOB: December 22, 1945   MRN: [id number]   Brief patient profile:  77  yobf  from [employer] active smoker slowed by knees referred to pulmonary clinic 02/06/2023 by Elliot Gurney DO  for cough and sob onset x      Mid of Feb 2024   History of Present Illness  02/06/2023  Pulmonary/ 1st office eval/Barbara Ahart no maint rx  Chief Complaint  Patient presents with   Consult    Chest congestion. Patient states [place] out on [place], windy weather.  Sx 3 weeks ago.  Pt doing well today.  Referred by Dr. Thersa Salt.  Dyspnea:  [facility] ok 3/19 improved over baseline s specific rx  Cough: white mucus varies but worse in am  Sleep: flat bed 2 pillows  SABA use: vicks sinus   No obvious day to day or daytime pattern/variability or assoc excess/ purulent sputum or mucus plugs or hemoptysis or cp or chest tightness, subjective wheeze or overt sinus or hb symptoms.   Sleeping  without nocturnal  or early am exacerbation  of respiratory  c/o's or need for noct saba. Also denies any obvious fluctuation of symptoms with weather or environmental changes or other aggravating or alleviating factors except as outlined above   No unusual exposure hx or h/o childhood pna/ asthma or knowledge of premature birth.  Current Allergies, Complete Past Medical History, Past Surgical History, Family History, and Social History were reviewed in [employer] record.  ROS  The following are not active complaints unless bolded Hoarseness, sore throat, dysphagia, dental problems, itching, sneezing,  nasal congestion or discharge of excess mucus or purulent secretions, ear ache,   fever, chills, sweats, unintended wt loss or wt gain, classically pleuritic or exertional cp,  orthopnea pnd or arm/hand swelling  or leg swelling, presyncope, palpitations, abdominal pain, anorexia, nausea, vomiting, diarrhea  or change in bowel habits or change in bladder habits, change in stools or change in urine, dysuria,  hematuria,  rash, arthralgias, visual complaints, headache, numbness, weakness or ataxia or problems with walking or coordination,  change in mood or  memory.             Past Medical History:  Diagnosis Date   Aortic valve sclerosis    Chronic kidney disease    CKD (chronic kidney disease), stage IV (HCC)    Diabetes mellitus (HCC)    Diastolic dysfunction    Hypertension    LVH (left ventricular hypertrophy)     Outpatient Medications Prior to Visit  Medication Sig Dispense Refill   aspirin EC 81 MG tablet Take 81 mg by mouth daily. Swallow whole.     calcitRIOL (ROCALTROL) 0.25 MCG capsule Take 0.25 mcg by mouth daily.     empagliflozin (JARDIANCE) 10 MG TABS tablet Take 1 tablet (10 mg total) by mouth daily. 30 tablet 3   furosemide (LASIX) 20 MG tablet Take 20 mg by mouth 2 (two) times daily.     gabapentin (NEURONTIN) 100 MG capsule Take 1 capsule (100 mg total) by mouth 3 (three) times daily. 90 capsule 3   hydrALAZINE (APRESOLINE) 50 MG tablet Take 1.5 tablets (75 mg total) by mouth 3 (three) times daily. 405 tablet 3   losartan (COZAAR) 50 MG tablet Take 50 mg by mouth daily.     NIFEdipine (PROCARDIA XL/NIFEDICAL-XL) 90 MG 24 hr tablet Take 90 mg by mouth daily.     rosuvastatin (CRESTOR) 40 MG tablet Take 1 tablet (40  mg total) by mouth daily. 90 tablet 0   epoetin alfa (EPOGEN) 3000 UNIT/ML injection Inject 3,000 Units into the skin every 14 (fourteen) days. Patient has not started medication as of 02/06/2023. (Patient not taking: Reported on 02/06/2023)     No facility-administered medications prior to visit.     Objective:     BP 124/64 (BP Location: Left Arm, Patient Position: Sitting, Cuff Size: Normal)   Pulse 97   Temp 98.4 F (36.9 C) (Oral)   Ht 5\' 4"  (1.626 m)   Wt 152 lb 12.8 oz (69.3 kg)   SpO2 99%   BMI 26.23 kg/m   SpO2: 99 %  Amb bf nad   HEENT : Oropharynx  clear       NECK :  without  apparent JVD/ palpable Nodes/TM    LUNGS: no acc  muscle use,  Min barrel  contour chest wall with bilateral  slightly decreased bs s audible wheeze and  without cough on insp or exp maneuvers and min  Hyperresonant  to  percussion bilaterally    CV:  RRR  no s3 with 2/6 SEM s  increase in P2, and no edema   ABD:  soft and nontender with pos end  insp Hoover's  in the supine position.  No bruits or organomegaly appreciated   MS:  Nl gait/ ext warm without deformities Or obvious joint restrictions  calf tenderness, cyanosis or clubbing     SKIN: warm and dry without lesions    NEURO:  alert, approp, nl sensorium with  no motor or cerebellar deficits apparent.        I personally reviewed images and agree with radiology impression as follows:  CXR:   pa and lateral 01/15/23  No active dz  My review: slt eventration Ant R HD    I personally reviewed images and agree with radiology impression as follows:  Chest CT cuts on abd study 01/15/23      Lower chest: There is ectasia of bronchi in the lower lung fields. There are scattered small patchy ground-glass densities in both lower lung fields   My review c/w mild bronchiectasis/ inflamatory lung dz     Assessment   Bronchiectasis without complication ([place]) Active smoker - mild bronchiectasis on CT ad 01/15/23   Likely result of infection as child as have no prior CT's but needs pfts to complete the w/u and HRCT eventually to fully assess the severity but other than breathing clean air I have no additonal recs for rx at this point  Advised:   I reviewed the Fletcher curve with the patient that basically indicates  if you quit smoking when your best day FEV1 is still well preserved (as is likely still   the case here)  it is highly unlikely you will progress to severe disease and informed the patient there was  no medication on the market that has proven to alter the curve/ its downward trajectory  or the likelihood of progression of their disease(unlike other chronic medical conditions  such as atheroclerosis where we do think we can change the natural hx with risk reducing meds)    Therefore stopping smoking and maintaining abstinence are  the most important aspects of her care, not choice of inhalers or for that matter, pulmonary doctors.   Treatment other than smoking cessation  is entirely directed by severity of symptoms and focused also on reducing exacerbations, not attempting to change the natural history of the disease.  Cigarette smoker Referred for LDSCT   02/06/2023   Counseled re importance of smoking cessation but did not meet time criteria for separate billing    Advised: Low-dose CT lung cancer screening is recommended for patients who are 72-61 years of age with a 20+ pack-year history of smoking and who are currently smoking or quit <=15 years ago. No coughing up blood  No unintentional weight loss of > 15 pounds in the last 6 months - pt is eligible for scanning yearly until age 71            Each maintenance medication was reviewed in detail including emphasizing most importantly the difference between maintenance and prns and under what circumstances the prns are to be triggered using an action plan format where appropriate.  Total time for H and P, chart review, counseling,   and generating customized AVS unique to this office visit / same day charting = 45 min new pt eval           Christinia Gully, MD 02/06/2023

## 2023-02-06 NOTE — Assessment & Plan Note (Signed)
Referred for LDSCT   02/06/2023   Counseled re importance of smoking cessation but did not meet time criteria for separate billing    Advised: Low-dose CT lung cancer screening is recommended for patients who are 24-77 years of age with a 20+ pack-year history of smoking and who are currently smoking or quit <=15 years ago. No coughing up blood  No unintentional weight loss of > 15 pounds in the last 6 months - pt is eligible for scanning yearly until age 74            Each maintenance medication was reviewed in detail including emphasizing most importantly the difference between maintenance and prns and under what circumstances the prns are to be triggered using an action plan format where appropriate.  Total time for H and P, chart review, counseling,   and generating customized AVS unique to this office visit / same day charting = 45 min new pt eval

## 2023-02-06 NOTE — Patient Instructions (Addendum)
My office will be contacting you by phone for referral to PFTs at [facility]   - if you don't hear back from my office within one week please call us back or notify us thru MyChart and we'll address it right away.   I have referred you also for lung cancer screening at [facility]   The key is to stop smoking completely before smoking completely stops you!   Please schedule a follow up visit in 3 months but call sooner if needed

## 2023-02-13 ENCOUNTER — Other Ambulatory Visit: Payer: Self-pay

## 2023-02-18 ENCOUNTER — Encounter (HOSPITAL_COMMUNITY)
Admission: RE | Admit: 2023-02-18 | Discharge: 2023-02-18 | Disposition: A | Discharge status: Home / Self Care | Payer: Medicare HMO | Source: Ambulatory Visit | Attending: Nephrology | Admitting: Nephrology

## 2023-02-18 ENCOUNTER — Other Ambulatory Visit: Payer: Self-pay | Admitting: Family Medicine

## 2023-02-18 VITALS — BP 150/52 | HR 114 | Temp 98.4°F | Resp 16

## 2023-02-18 DIAGNOSIS — N184 Chronic kidney disease, stage 4 (severe): Secondary | ICD-10-CM | POA: Diagnosis not present

## 2023-02-18 DIAGNOSIS — E785 Hyperlipidemia, unspecified: Secondary | ICD-10-CM

## 2023-02-18 DIAGNOSIS — D638 Anemia in other chronic diseases classified elsewhere: Secondary | ICD-10-CM

## 2023-02-18 DIAGNOSIS — D631 Anemia in chronic kidney disease: Secondary | ICD-10-CM | POA: Insufficient documentation

## 2023-02-18 LAB — POCT HEMOGLOBIN-HEMACUE: Hemoglobin: 9.8 g/dL — ABNORMAL LOW (ref 12.0–15.0)

## 2023-02-18 MED ORDER — EPOETIN ALFA-EPBX 3000 UNIT/ML IJ SOLN
3000.0000 [IU] | Freq: Once | INTRAMUSCULAR | Status: AC
  Administered 2023-02-18: 3000 [IU] via SUBCUTANEOUS
  Filled 2023-02-18: qty 1

## 2023-02-18 NOTE — Progress Notes (Signed)
Diagnosis: Anemia in Chronic Kidney Disease  Provider:  Manpreet Bhutani MD  Procedure: Injection  Retacrit (epoetin alfa-epbx), Dose: 3000 Units, Site: subcutaneous, Number of injections: 1  Hgb 9.8. Injection administered in left arm.  Post Care: Observation period completed  Discharge: Condition: Good, Destination: Home . AVS Provided  Performed by:  Binnie Kand, RN

## 2023-02-19 ENCOUNTER — Other Ambulatory Visit: Payer: Self-pay

## 2023-02-19 MED ORDER — FUROSEMIDE 20 MG PO TABS: 20.0000 mg | ORAL_TABLET | Freq: Two times a day (BID) | ORAL | 5 refills | Status: DC

## 2023-02-24 ENCOUNTER — Emergency Department (HOSPITAL_COMMUNITY)
Admission: EM | Admit: 2023-02-24 | Discharge: 2023-02-24 | Disposition: A | Discharge status: Home / Self Care | Payer: Medicare HMO | Attending: Emergency Medicine | Admitting: Emergency Medicine

## 2023-02-24 ENCOUNTER — Emergency Department (HOSPITAL_COMMUNITY): Payer: Medicare HMO

## 2023-02-24 ENCOUNTER — Encounter (HOSPITAL_COMMUNITY): Payer: Self-pay | Admitting: Emergency Medicine

## 2023-02-24 ENCOUNTER — Other Ambulatory Visit: Payer: Self-pay

## 2023-02-24 DIAGNOSIS — I129 Hypertensive chronic kidney disease with stage 1 through stage 4 chronic kidney disease, or unspecified chronic kidney disease: Secondary | ICD-10-CM | POA: Diagnosis not present

## 2023-02-24 DIAGNOSIS — N184 Chronic kidney disease, stage 4 (severe): Secondary | ICD-10-CM | POA: Insufficient documentation

## 2023-02-24 DIAGNOSIS — Z79899 Other long term (current) drug therapy: Secondary | ICD-10-CM | POA: Insufficient documentation

## 2023-02-24 DIAGNOSIS — Z7982 Long term (current) use of aspirin: Secondary | ICD-10-CM | POA: Diagnosis not present

## 2023-02-24 DIAGNOSIS — S0081XA Abrasion of other part of head, initial encounter: Secondary | ICD-10-CM | POA: Insufficient documentation

## 2023-02-24 DIAGNOSIS — E1122 Type 2 diabetes mellitus with diabetic chronic kidney disease: Secondary | ICD-10-CM | POA: Insufficient documentation

## 2023-02-24 DIAGNOSIS — Z7984 Long term (current) use of oral hypoglycemic drugs: Secondary | ICD-10-CM | POA: Insufficient documentation

## 2023-02-24 DIAGNOSIS — W01198A Fall on same level from slipping, tripping and stumbling with subsequent striking against other object, initial encounter: Secondary | ICD-10-CM | POA: Diagnosis not present

## 2023-02-24 DIAGNOSIS — Z043 Encounter for examination and observation following other accident: Secondary | ICD-10-CM | POA: Diagnosis not present

## 2023-02-24 DIAGNOSIS — W19XXXA Unspecified fall, initial encounter: Secondary | ICD-10-CM

## 2023-02-24 DIAGNOSIS — S0993XA Unspecified injury of face, initial encounter: Secondary | ICD-10-CM | POA: Diagnosis not present

## 2023-02-24 DIAGNOSIS — M79642 Pain in left hand: Secondary | ICD-10-CM | POA: Insufficient documentation

## 2023-02-24 NOTE — ED Provider Notes (Signed)
[place] EMERGENCY DEPARTMENT AT [facility] Provider Note   CSN: [id number] Arrival date & time: 02/24/23  1410     History  Chief Complaint  Patient presents with   Marland McalpineFall    Deborah Gomez is a 77 y.o. female with HTN, HLD, CKD stage 4, anemia of chronic disease, diastolic dysfunction, who presents with fall.   Patient with mechanical fall last night fell into her dresser after tripping on the floor. Pt hit bridge of nose and bilateral thumb pain from grabbing onto dresser. Pt denies dizziness and denies LOC. Has no pain in her face or head, no neck/chest/abdominal/back pain. Otherwise in her NSOH. No recent illnesses/f/c, urinary symptoms, nausea/vomiting/diarrhea/constipation.    Fall       Home Medications Prior to Admission medications   Medication Sig Start Date End Date Taking? Authorizing Provider  aspirin EC 81 MG tablet Take 81 mg by mouth daily. Swallow whole.    [provider]  calcitRIOL (ROCALTROL) 0.25 MCG capsule Take 0.25 mcg by mouth daily.    [provider]  empagliflozin (JARDIANCE) 10 MG TABS tablet Take 1 tablet (10 mg total) by mouth daily. 11/20/22   Tommie Samsook, Jayce G, DO  epoetin alfa (EPOGEN) 3000 UNIT/ML injection Inject 3,000 Units into the skin every 14 (fourteen) days. Patient has not started medication as of 02/06/2023. 02/04/23   [provider]  furosemide (LASIX) 20 MG tablet Take 1 tablet (20 mg total) by mouth 2 (two) times daily. 02/19/23   Antoine PocheBranch, Jonathan F, MD  gabapentin (NEURONTIN) 100 MG capsule Take 1 capsule (100 mg total) by mouth 3 (three) times daily. 11/08/22   Tommie Samsook, Jayce G, DO  hydrALAZINE (APRESOLINE) 50 MG tablet TAKE 1 & 1/2 (ONE & ONE-HALF) TABLETS BY MOUTH THREE TIMES DAILY 03/01/23   Antoine PocheBranch, Jonathan F, MD  losartan (COZAAR) 50 MG tablet Take 50 mg by mouth daily. 11/02/21   [provider]  NIFEdipine (PROCARDIA XL/NIFEDICAL-XL) 90 MG 24 hr tablet Take 90 mg by mouth daily. 11/02/21    [provider]  rosuvastatin (CRESTOR) 40 MG tablet Take 1 tablet by mouth once daily 02/18/23   Tommie Samsook, Jayce G, DO      Allergies    Patient has no known allergies.    Review of Systems   Review of Systems Review of systems Negative for LOC, neck pain.  A 10 point review of systems was performed and is negative unless otherwise reported in HPI.  Physical Exam Updated Vital Signs BP (!) 143/56 (BP Location: Right Arm)   Pulse 88   Temp 98.2 F (36.8 C) (Oral)   Resp 16   Ht 5\' 4"  (1.626 m)   Wt 68.1 kg   SpO2 97%   BMI 25.76 kg/m  Physical Exam General: Normal appearing female, lying in bed.  HEENT: PERRLA, EOMI, Sclera anicteric, MMM, trachea midline. Mild abrasion to nasal bridge. Stable forehead, midface, nasal bridge. No nasal septal hematoma.  Cardiology: RRR, no murmurs/rubs/gallops. BL radial and DP pulses equal bilaterally.  Resp: Normal respiratory rate and effort. CTAB, no wheezes, rhonchi, crackles.  Abd: Soft, non-tender, non-distended. No rebound tenderness or guarding.  GU: Deferred. MSK: TTP of left 1st metacarpal, no deformity or signs of trauma. FROM BL UEs. NVI. No anatomic snuffbox tenderness of BL UEs. No peripheral edema or signs of trauma.  Skin: warm, dry.  Back: No C, T, or L spine midline TTP/stepoffs/deformities. Neuro: A&Ox4, CNs II-XII grossly intact. MAEs. Sensation grossly intact.  Psych:  Normal mood and affect.   ED Results / Procedures / Treatments   Labs (all labs ordered are listed, but only abnormal results are displayed) Labs Reviewed - No data to display  EKG None  Radiology No results found.  Procedures Procedures    Medications Ordered in ED Medications - No data to display  ED Course/ Medical Decision Making/ A&P                          Medical Decision Making Amount and/or Complexity of Data Reviewed Radiology: ordered. Decision-making details documented in ED Course.    MDM:    Patient very well  appearing, HDS. Patient with mild facial trauma after fall last night. Doesn't take blood thinners, no headache, no LOC/N/V, fall was >12 hours ago with no symptoms, no FNDs. Do not believe patient requires CTH at this time. She does have TTP over nasal bridge but it is stable, patient agrees to facial CT, and no fracture of her face is found. Consider fracture/dislocation of L thumb though none apparent on exam, XR is reassuring. No anatomic snuffbox tenderness to indicate scaphoid injury. Patient made aware of her thyroid, this is an old problem. [facility] and feels well, stable for DC. Instructed to f/u with her PCP. DC w/ discharge instructions/return precautions. All questions answered to patient's satisfaction.    Clinical Course as of 03/07/23 1050  Sun Feb 24, 2023  1550 DG Hand Complete Left No acute fracture or dislocation.  If persistent clinical concern for occult fracture, recommend immobilization with repeat imaging in 2 weeks.   [HN]  1814 CT Maxillofacial Wo Contrast 1. No acute facial bone fracture. 2. Markedly enlarged and heterogeneous left lobe of the thyroid.   [HN]    Clinical Course User Index [HN] Loetta RoughNaasz, Justina Bertini N, MD     Imaging Studies ordered: I ordered imaging studies including XR L hand, CT face I independently visualized and interpreted imaging. I agree with the radiologist interpretation  Additional history obtained from chart review.   Social Determinants of Health: Patient lives independently   Disposition:  DC w/ discharge instructions/return precautions. All questions answered to patient's satisfaction.    Co morbidities that complicate the patient evaluation  Past Medical History:  Diagnosis Date   Aortic valve sclerosis    Chronic kidney disease    CKD (chronic kidney disease), stage IV    Diabetes mellitus    Diastolic dysfunction    Hypertension    LVH (left ventricular hypertrophy)      Medicines No orders  of the defined types were placed in this encounter.   I have reviewed the patients home medicines and have made adjustments as needed  Problem List / ED Course: Problem List Items Addressed This Visit   None Visit Diagnoses     Fall, initial encounter    -  Primary   Abrasion of face, initial encounter                       This note was created using dictation software, which may contain spelling or grammatical errors.    Loetta RoughNaasz, Jenya Putz N, MD 03/07/23 1055

## 2023-02-24 NOTE — ED Triage Notes (Signed)
Mechanical fall last night fell into dressers. Pt hit bridge of nose and bilateral thumb pain from grabbing onto dresser. Pt denies dizziness and denies LOC.

## 2023-02-24 NOTE — Discharge Instructions (Signed)
Thank you for coming to [facility] Emergency Department. You were seen for fall. We did an exam, labs, and imaging, and these showed no acute findings.  Please follow up with your primary care provider within 1 week. You can take 650mg  tylenol (acetaminophen) every 4-6 hours as needed for pain. Continue to use the neosporin.    Do not hesitate to return to the ED or call 911 if you experience: -Worsening symptoms -headache, blurry vision, confusion -Lightheadedness, passing out -Fevers/chills -Anything else that concerns you

## 2023-02-27 ENCOUNTER — Telehealth: Payer: Self-pay

## 2023-02-27 NOTE — Telephone Encounter (Signed)
     Patient  visit on 02/24/2023  at [facility] was for fall.  Have you been able to follow up with your primary care physician? Yes  The patient was or was not able to obtain any needed medicine or equipment. No medication prescribed.  Are there diet recommendations that you are having difficulty following? No  Patient expresses understanding of discharge instructions and education provided has no other needs at this time. Yes   [facility]  [facility] Community Resource Care Guide   [email or web address]  ?? [phone]   Website: [email or web address]  [email or web address]

## 2023-03-01 ENCOUNTER — Other Ambulatory Visit: Payer: Self-pay | Admitting: Cardiology

## 2023-03-04 ENCOUNTER — Encounter (HOSPITAL_COMMUNITY)
Admission: RE | Admit: 2023-03-04 | Discharge: 2023-03-04 | Disposition: A | Discharge status: Home / Self Care | Payer: Medicare HMO | Source: Ambulatory Visit | Attending: Nephrology | Admitting: Nephrology

## 2023-03-04 VITALS — BP 148/55 | HR 102 | Temp 97.8°F | Resp 16

## 2023-03-04 DIAGNOSIS — D631 Anemia in chronic kidney disease: Secondary | ICD-10-CM | POA: Diagnosis not present

## 2023-03-04 DIAGNOSIS — N184 Chronic kidney disease, stage 4 (severe): Secondary | ICD-10-CM | POA: Diagnosis not present

## 2023-03-04 DIAGNOSIS — D638 Anemia in other chronic diseases classified elsewhere: Secondary | ICD-10-CM

## 2023-03-04 LAB — POCT HEMOGLOBIN-HEMACUE: Hemoglobin: 10.5 g/dL — ABNORMAL LOW (ref 12.0–15.0)

## 2023-03-04 MED ORDER — EPOETIN ALFA-EPBX 3000 UNIT/ML IJ SOLN: 3000.0000 [IU] | Freq: Once | INTRAMUSCULAR | Status: DC

## 2023-03-04 NOTE — Addendum Note (Signed)
Encounter addended by: Evelena Peat, RN on: 03/04/2023 10:03 AM  Actions taken: Therapy plan modified

## 2023-03-04 NOTE — Addendum Note (Signed)
Encounter addended by: Tera Mater, [facility] on: 03/04/2023 10:05 AM  Actions taken: Order list changed

## 2023-03-04 NOTE — Progress Notes (Signed)
Diagnosis: Anemia in Chronic Kidney Disease  Provider:  Manpreet Bhutani MD  Procedure: Injection  Retacrit (epoetin alfa-epbx), Dose: 3000 Units, Site: subcutaneous, Number of injections: Not Given  Hgb 10.5   Post Care: Patient declined observation  Discharge: Condition: Good, Destination: Home . AVS Provided  Performed by:  Evelena Peat, RN

## 2023-03-05 ENCOUNTER — Ambulatory Visit (INDEPENDENT_AMBULATORY_CARE_PROVIDER_SITE_OTHER): Payer: Medicare HMO | Admitting: Family Medicine

## 2023-03-05 VITALS — BP 146/71 | HR 86 | Temp 96.6°F | Ht 64.0 in | Wt 149.0 lb

## 2023-03-05 DIAGNOSIS — W19XXXA Unspecified fall, initial encounter: Secondary | ICD-10-CM | POA: Insufficient documentation

## 2023-03-05 DIAGNOSIS — W19XXXD Unspecified fall, subsequent encounter: Secondary | ICD-10-CM

## 2023-03-05 DIAGNOSIS — S0081XD Abrasion of other part of head, subsequent encounter: Secondary | ICD-10-CM | POA: Diagnosis not present

## 2023-03-05 NOTE — Patient Instructions (Signed)
Continue your medications.  I am glad you're doing well.  Take care  Dr. Adriana Simas

## 2023-03-05 NOTE — Assessment & Plan Note (Signed)
Imaging reviewed.  CT maxillofacial was negative.  X-ray of the hand was negative.  Patient is currently asymptomatic and feeling well.  Supportive care.

## 2023-03-05 NOTE — Progress Notes (Signed)
Subjective:  Patient ID: Deborah Gomez, female    DOB: 19-Dec-1945  Age: 77 y.o. MRN: [id number]  CC: Chief Complaint  Patient presents with   ER follow up from a Fall    HPI:  77 year old female presents for ER follow-up.  Patient recently suffered a fall and was seen in the ER on 4/7.  Imaging was obtained and was negative.  Patient states that she is back to her normal self.  No pain at this time.  She is feeling well.  She has no complaints or concerns.  Patient Active Problem List   Diagnosis Date Noted   Fall 03/05/2023   Cigarette smoker 02/06/2023   Bronchiectasis without complication 02/06/2023   Unintentional weight loss 01/18/2023   Aortic valve sclerosis 04/11/2022   Diastolic dysfunction 04/11/2022   CKD (chronic kidney disease) stage 4, GFR 15-29 ml/min 01/09/2022   Nephrotic range proteinuria 01/09/2022   Anemia of chronic disease 01/09/2022   Goiter 01/09/2022   Positive colorectal cancer screening using Cologuard test 12/19/2021   HLD (hyperlipidemia) 08/16/2021   Vitamin D deficiency 06/06/2021   Hypertension 05/15/2021    Social Hx   Social History   Socioeconomic History   Marital status: Significant Other    Spouse name: Not on file   Number of children: 2   Years of education: Not on file   Highest education level: Not on file  Occupational History   Occupation: Retired    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  Tobacco Use   Smoking status: Every Day    Packs/day: 0.25    Years: 52.00    Additional pack years: 0.00    Total pack years: 13.00    Types: Cigarettes    Last attempt to quit: 08/21/2021    Years since quitting: 1.5   Smokeless tobacco: Never   Tobacco comments:    Smokes about 4 cigarettes per day   Vaping Use   Vaping Use: Never used  Substance and Sexual Activity   Alcohol use: Not Currently   Drug use: Never   Sexual activity: Yes    Birth control/protection: Post-menopausal  Other Topics Concern    Not on file  Social History Narrative   1 child in [place], the other is not nearby   Social Determinants of Health   Financial Resource Strain: Low Risk  (08/22/2022)   Overall Financial Resource Strain (CARDIA)    Difficulty of Paying Living Expenses: Not hard at all  Food Insecurity: No Food Insecurity (08/22/2022)   Hunger Vital Sign    Worried About Running Out of Food in the Last Year: Never true    Ran Out of Food in the Last Year: Never true  Transportation Needs: No Transportation Needs (08/22/2022)   PRAPARE - Administrator, Civil Service (Medical): No    Lack of Transportation (Non-Medical): No  Physical Activity: Insufficiently Active (08/22/2022)   Exercise Vital Sign    Days of Exercise per Week: 3 days    Minutes of Exercise per Session: 30 min  Stress: No Stress Concern Present (08/22/2022)   [employer] of Occupational Health - Occupational Stress Questionnaire    Feeling of Stress : Not at all  Social Connections: Socially Isolated (08/22/2022)   Social Connection and Isolation Panel [NHANES]    Frequency of Communication with Friends and Family: Twice a week    Frequency of Social Gatherings with Friends and Family: Once a week    Attends Religious Services: Never  Active Member of Clubs or Organizations: No    Attends Banker Meetings: Never    Marital Status: Widowed    Review of Systems Per HPI  Objective:  BP (!) 146/71   Pulse 86   Temp (!) 96.6 F (35.9 C)   Ht  (1.626 m)   Wt 149 lb (67.6 kg)   SpO2 100%   BMI 25.58 kg/m      03/05/2023   11:11 AM 03/04/2023    9:53 AM 02/24/2023    6:28 PM  BP/Weight  Systolic BP 146 148 143  Diastolic BP 71 55 56  Wt. (Lbs) 149    BMI 25.58 kg/m2      Physical Exam Vitals and nursing note reviewed.  Constitutional:      General: She is not in acute distress.    Appearance: Normal appearance.  HENT:     Head: Normocephalic and atraumatic.  Eyes:     General:         Right eye: No discharge.        Left eye: No discharge.     Conjunctiva/sclera: Conjunctivae normal.  Cardiovascular:     Rate and Rhythm: Normal rate and regular rhythm.  Pulmonary:     Effort: Pulmonary effort is normal.     Breath sounds: No wheezing.  Musculoskeletal:        General: No swelling or tenderness. Normal range of motion.  Neurological:     General: No focal deficit present.     Mental Status: She is alert.  Psychiatric:        Mood and Affect: Mood normal.        Behavior: Behavior normal.     Lab Results  Component Value Date   WBC 4.1 01/15/2023   HGB 10.5 (L) 03/04/2023   HCT 34.6 (L) 01/15/2023   PLT 185 01/15/2023   GLUCOSE 120 (H) 01/15/2023   CHOL 98 01/09/2023   TRIG 96 01/09/2023   HDL 44 01/09/2023   LDLCALC 35 01/09/2023   ALT 34 01/15/2023   AST 50 (H) 01/15/2023   NA 133 (L) 01/15/2023   K 4.2 01/15/2023   CL 103 01/15/2023   CREATININE 2.18 (H) 01/15/2023   BUN 42 (H) 01/15/2023   CO2 21 (L) 01/15/2023   TSH 0.689 12/25/2022   HGBA1C 5.9 (H) 01/09/2023   MICROALBUR 236.0 (H) 01/09/2023     Assessment & Plan:   Problem List Items Addressed This Visit       Other   Fall - Primary    Imaging reviewed.  CT maxillofacial was negative.  X-ray of the hand was negative.  Patient is currently asymptomatic and feeling well.  Supportive care.       Everlene Other DO [facility]

## 2023-03-18 ENCOUNTER — Encounter (HOSPITAL_COMMUNITY)
Admission: RE | Admit: 2023-03-18 | Discharge: 2023-03-18 | Disposition: A | Discharge status: Home / Self Care | Payer: Medicare HMO | Source: Ambulatory Visit | Attending: Nephrology | Admitting: Nephrology

## 2023-03-18 ENCOUNTER — Telehealth: Payer: Self-pay | Admitting: Family Medicine

## 2023-03-18 VITALS — BP 136/54 | HR 100 | Temp 98.4°F | Resp 14

## 2023-03-18 DIAGNOSIS — D631 Anemia in chronic kidney disease: Secondary | ICD-10-CM | POA: Diagnosis not present

## 2023-03-18 DIAGNOSIS — D638 Anemia in other chronic diseases classified elsewhere: Secondary | ICD-10-CM

## 2023-03-18 DIAGNOSIS — N184 Chronic kidney disease, stage 4 (severe): Secondary | ICD-10-CM | POA: Diagnosis not present

## 2023-03-18 LAB — POCT HEMOGLOBIN-HEMACUE: Hemoglobin: 10.4 g/dL — ABNORMAL LOW (ref 12.0–15.0)

## 2023-03-18 MED ORDER — EPOETIN ALFA-EPBX 3000 UNIT/ML IJ SOLN: 3000.0000 [IU] | Freq: Once | INTRAMUSCULAR | Status: DC

## 2023-03-18 NOTE — Progress Notes (Signed)
Diagnosis: Anemia in Chronic Kidney Disease  Provider:  Manpreet Bhutani MD  Procedure: Injection  Retacrit (epoetin alfa-epbx), Dose: 3000 Units, Site: subcutaneous, Number of injections: 0  Hgb 10.4. Injection not administered.  Post Care: Patient declined observation  Discharge: Condition: Good, Destination: Home . AVS Provided  Performed by:  Wyvonne Lenz, RN

## 2023-03-18 NOTE — Addendum Note (Signed)
Encounter addended by: Wyvonne Lenz, RN on: 03/18/2023 10:06 AM  Actions taken: Therapy plan modified

## 2023-03-18 NOTE — Telephone Encounter (Signed)
Contacted [employer] to schedule their annual wellness visit. Appointment made for 08/30/2023.  Thank you,  Judeth Cornfield,  AMB Clinical Support [facility], [facility], [facility] AWV Program Direct Dial ??[phone]

## 2023-03-20 ENCOUNTER — Other Ambulatory Visit: Payer: Self-pay | Admitting: Family Medicine

## 2023-03-20 DIAGNOSIS — E1122 Type 2 diabetes mellitus with diabetic chronic kidney disease: Secondary | ICD-10-CM

## 2023-03-21 ENCOUNTER — Encounter (INDEPENDENT_AMBULATORY_CARE_PROVIDER_SITE_OTHER): Payer: Self-pay | Admitting: Gastroenterology

## 2023-03-21 ENCOUNTER — Ambulatory Visit (INDEPENDENT_AMBULATORY_CARE_PROVIDER_SITE_OTHER): Payer: Medicare HMO | Admitting: Gastroenterology

## 2023-03-21 VITALS — BP 129/69 | HR 97 | Temp 98.2°F | Ht 64.0 in | Wt 148.0 lb

## 2023-03-21 DIAGNOSIS — R195 Other fecal abnormalities: Secondary | ICD-10-CM | POA: Diagnosis not present

## 2023-03-21 NOTE — Progress Notes (Addendum)
Referring Provider: Tommie Sams, DO Primary Care Physician:  Tommie Sams, DO Primary GI Physician: not assigned   Chief Complaint  Patient presents with   positive cologuard    Patient referred for positive cologuard test.    HPI:   Deborah Gomez is a 77 y.o. female with past medical history of aortic valve sclerosis, CKD stage IV, DM, HTN, LVH  Patient presenting today for positive cologuard in 2022.  Patent actually saw Lewie Loron, NP with our team in January of 2023 and was encouraged to proceed with colonoscopy for further evaluation, however, she did not wish to pursue at that time.   Recent ECHO feb 2024 with EF 65-70%.  Labs in march with hgb 9.1, iron studies with iron 58, TIBC 195, iron sat 30, ferritin 221.  She notes at time of positive cologuard she was having constipation and having to strain a lot, had some rectal bleeding then but this resolved. She felt this was related to hemorrhoids. She has had no further rectal bleeding, no episodes of melena. No abdominal pain .having a BM daily, no changes in bowel habits. No nausea or vomiting.   Notably she weighed 171 lbs in September 2023, she is 148 lbs now. She attributes this to an acute respiratory illness she had noting she was very sick, though she was not hospitalized for this. She feels she is starting to eat better now.   Patient is adamant she does not wish to have a colonoscopy   Last Colonoscopy: never  Last Endoscopy: never   Recommendations:    Past Medical History:  Diagnosis Date   Aortic valve sclerosis    Chronic kidney disease    CKD (chronic kidney disease), stage IV (HCC)    Diabetes mellitus (HCC)    Diastolic dysfunction    Hypertension    LVH (left ventricular hypertrophy)     Past Surgical History:  Procedure Laterality Date   APPENDECTOMY     BREAST SURGERY Left    CARPAL TUNNEL RELEASE Right    CATARACT EXTRACTION W/PHACO Right 07/30/2022   Procedure: CATARACT EXTRACTION  PHACO AND INTRAOCULAR LENS PLACEMENT (IOC);  Surgeon: Fabio Pierce, MD;  Location: [facility];  Service: Ophthalmology;  Laterality: Right;  CDE:19.32   CATARACT EXTRACTION W/PHACO Left 08/27/2022   Procedure: CATARACT EXTRACTION PHACO AND INTRAOCULAR LENS PLACEMENT (IOC);  Surgeon: Fabio Pierce, MD;  Location: [facility];  Service: Ophthalmology;  Laterality: Left;  CDE: 32.13   REPLACEMENT TOTAL KNEE Left    THYROID SURGERY     partial removal    Current Outpatient Medications  Medication Sig Dispense Refill   aspirin EC 81 MG tablet Take 81 mg by mouth daily. Swallow whole.     calcitRIOL (ROCALTROL) 0.25 MCG capsule Take 0.25 mcg by mouth daily.     empagliflozin (JARDIANCE) 10 MG TABS tablet Take 1 tablet (10 mg total) by mouth daily. 30 tablet 3   epoetin alfa (EPOGEN) 3000 UNIT/ML injection Inject 3,000 Units into the skin every 14 (fourteen) days. Patient has not started medication as of 02/06/2023.     furosemide (LASIX) 20 MG tablet Take 1 tablet (20 mg total) by mouth 2 (two) times daily. 60 tablet 5   gabapentin (NEURONTIN) 100 MG capsule TAKE 1 CAPSULE BY MOUTH THREE TIMES DAILY 90 capsule 0   hydrALAZINE (APRESOLINE) 50 MG tablet TAKE 1 & 1/2 (ONE & ONE-HALF) TABLETS BY MOUTH THREE TIMES DAILY 405 tablet 0   losartan (COZAAR) 50 MG  tablet Take 50 mg by mouth daily.     NIFEdipine (PROCARDIA XL/NIFEDICAL-XL) 90 MG 24 hr tablet Take 90 mg by mouth daily.     rosuvastatin (CRESTOR) 40 MG tablet Take 1 tablet by mouth once daily 90 tablet 0   No current facility-administered medications for this visit.    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  Tobacco Use   Smoking status: Every Day    Packs/day: 0.25    Years: 52.00    Additional pack years: 0.00    Total pack years: 13.00    Types: Cigarettes    Last attempt to quit: 08/21/2021    Years since quitting: 1.5   Smokeless tobacco: Never   Tobacco comments:    Smokes about 4 cigarettes per day   Vaping Use   Vaping Use: Never used  Substance and Sexual Activity   Alcohol use: Not Currently   Drug use: Never   Sexual activity: Yes    Birth control/protection: Post-menopausal  Other Topics Concern   Not on file  Social History Narrative   1 child in [place], the other is not nearby   Social Determinants of Health   Financial Resource Strain: Low Risk  (08/22/2022)   Overall Financial Resource Strain (CARDIA)    Difficulty of Paying Living Expenses: Not hard at all  Food Insecurity: No Food Insecurity (08/22/2022)   Hunger Vital Sign    Worried About Running Out of Food in the Last Year: Never true    Ran Out of Food in the Last Year: Never true  Transportation Needs: No Transportation Needs (08/22/2022)   PRAPARE - Administrator, Civil Service (Medical): No    Lack of Transportation (Non-Medical): No  Physical Activity: Insufficiently Active (08/22/2022)   Exercise Vital Sign    Days of Exercise per Week: 3 days    Minutes of Exercise per Session: 30 min  Stress: No Stress Concern Present (08/22/2022)   [employer] of Occupational Health - Occupational Stress Questionnaire    Feeling of Stress : Not at all  Social Connections: Socially Isolated (08/22/2022)   Social Connection and Isolation Panel [NHANES]    Frequency of Communication with Friends and Family: Twice a week    Frequency of Social Gatherings with Friends and Family: Once a week    Attends Religious Services: Never    Database administrator or Organizations: No    Attends Tax inspector Meetings: Never    Marital Status: Widowed   Review of systems General: negative for malaise, night sweats, fever, chills, +weight loss  Neck: Negative for lumps, goiter, pain and significant neck swelling Resp: Negative for cough, wheezing, dyspnea at rest CV: Negative for chest pain, leg swelling, palpitations, orthopnea GI: denies melena, hematochezia, nausea, vomiting, diarrhea, constipation, dysphagia, odyonophagia, early satiety or unintentional weight loss.  MSK: Negative for joint pain or swelling, back pain, and muscle pain. Derm: Negative for itching or  rash Psych: Denies depression, anxiety, memory loss, confusion. No homicidal or suicidal ideation.  Heme: Negative for prolonged bleeding, bruising easily, and swollen nodes. Endocrine: Negative for cold or heat intolerance, polyuria, polydipsia and goiter. Neuro: negative for tremor, gait imbalance, syncope and seizures. The remainder of the review of systems is noncontributory.  Physical Exam: There were no vitals taken for this visit. General:   Alert and oriented. No distress noted. Pleasant and cooperative.  Head:  Normocephalic and atraumatic. Eyes:  Conjuctiva clear without scleral icterus. Mouth:  Oral mucosa pink and moist. Good dentition. No lesions. Heart: Normal rate and rhythm, s1 and s2 heart sounds present.  Lungs: Clear lung sounds in all lobes. Respirations equal and unlabored. Abdomen:  +BS, soft, non-tender and non-distended. No rebound or guarding. No HSM or masses noted. Derm: No palmar erythema or jaundice Msk:  Symmetrical without gross deformities. Normal posture. Extremities:  Without edema. Neurologic:  Alert and  oriented x4 Psych:  Alert and cooperative. Normal mood and affect.  Invalid input(s): "6 MONTHS"   ASSESSMENT: Deborah Gomez is a 77 y.o. female presenting today for follow up of positive cologuard in 2022.  Patient presents again for positive cologuard in 2022, she  previously saw one of our team members in January 2023 and was recommended to have colonoscopy for further evaluation which she declined. She has no rectal bleeding, melena, abdominal pain or changes in her bowel habits, though notably with approximately 23 lbs weight loss since September 2023 which patient attributes to a severe respiratory illness she experienced a while back. I discussed with the patient Cologuard is intended for the qualitative detection of colorectal neoplasia associated DNA markers and for the presence of occult hemoglobin in human stool. A positive result may indicate the presence of colorectal cancer (CRC) or pre cancerous polyps, and should be followed by colonoscopy. A negative Cologuard test result does not guarantee absence of cancer or pre cancerous polyp.False positives and false negatives do occur.   Patient is adamant she does not want to have a colonoscopy. I explained the risks, benefits and indications for this. I discussed with her that in presence of a positive cologuard and recent significant weight loss, this would ultimately be concerning for the possibility of malignancy and this cannot be ruled out without further investigation via colonoscopy. Patient verbalized understanding but still refusing to proceed with colonoscopy. I encouraged her to make me aware if she changes her mind.     PLAN:   Patient will let me know if she wishes to proceed with colonoscopy   All questions were answered, patient verbalized understanding and is in agreement with plan as outlined above.   Follow Up: PRN  Annalaya Wile L. Jeanmarie Hubert, MSN, APRN, AGNP-C Adult-Gerontology Nurse Practitioner [facility]  I have reviewed the note and agree with the APP's assessment as described in this progress note  Katrinka Blazing, MD Gastroenterology and Hepatology [facility]

## 2023-03-21 NOTE — Patient Instructions (Addendum)
Cologuard is intended for the qualitative detection of colorectal neoplasia associated DNA markers and for the presence of occult hemoglobin in human stool. A positive result may indicate the presence of colorectal cancer (CRC) or pre cancerous polyps, and should be followed by colonoscopy. A negative Cologuard test result does not guarantee absence of cancer or pre cancerous polyp. False positives and false negatives do occur.  As we discussed with positive cologuard, especially in conjunction with your significant weight loss, I would highly recommend proceeding with colonoscopy as I cannot rule out the presence of something more severe such as a cancer. Please let me know if you change your mind and want to proceed with this evaluation.   Follow up as needed

## 2023-03-22 ENCOUNTER — Other Ambulatory Visit: Payer: Self-pay | Admitting: Family Medicine

## 2023-03-22 DIAGNOSIS — E1122 Type 2 diabetes mellitus with diabetic chronic kidney disease: Secondary | ICD-10-CM

## 2023-03-22 NOTE — Addendum Note (Signed)
Addended by: Dolores Frame on: 03/22/2023 10:32 PM   Modules accepted: Level of Service

## 2023-04-01 ENCOUNTER — Encounter (HOSPITAL_COMMUNITY)
Admission: RE | Admit: 2023-04-01 | Discharge: 2023-04-01 | Disposition: A | Discharge status: Home / Self Care | Payer: Medicare HMO | Source: Ambulatory Visit | Attending: Nephrology | Admitting: Nephrology

## 2023-04-01 ENCOUNTER — Telehealth: Payer: Self-pay

## 2023-04-01 ENCOUNTER — Telehealth (HOSPITAL_COMMUNITY): Payer: Self-pay

## 2023-04-01 VITALS — BP 123/56 | HR 69 | Temp 98.6°F | Resp 16

## 2023-04-01 DIAGNOSIS — D631 Anemia in chronic kidney disease: Secondary | ICD-10-CM | POA: Diagnosis not present

## 2023-04-01 DIAGNOSIS — N179 Acute kidney failure, unspecified: Secondary | ICD-10-CM | POA: Diagnosis not present

## 2023-04-01 DIAGNOSIS — N184 Chronic kidney disease, stage 4 (severe): Secondary | ICD-10-CM | POA: Insufficient documentation

## 2023-04-01 DIAGNOSIS — E1122 Type 2 diabetes mellitus with diabetic chronic kidney disease: Secondary | ICD-10-CM | POA: Diagnosis not present

## 2023-04-01 DIAGNOSIS — I129 Hypertensive chronic kidney disease with stage 1 through stage 4 chronic kidney disease, or unspecified chronic kidney disease: Secondary | ICD-10-CM | POA: Diagnosis not present

## 2023-04-01 DIAGNOSIS — D638 Anemia in other chronic diseases classified elsewhere: Secondary | ICD-10-CM | POA: Insufficient documentation

## 2023-04-01 LAB — RENAL FUNCTION PANEL
Albumin: 3.7 g/dL (ref 3.5–5.0)
Anion gap: 12 (ref 5–15)
BUN: 34 mg/dL — ABNORMAL HIGH (ref 8–23)
CO2: 24 mmol/L (ref 22–32)
Calcium: 9.3 mg/dL (ref 8.9–10.3)
Chloride: 100 mmol/L (ref 98–111)
Creatinine, Ser: 2.51 mg/dL — ABNORMAL HIGH (ref 0.44–1.00)
GFR, Estimated: 19 mL/min — ABNORMAL LOW (ref >60)
Glucose, Bld: 119 mg/dL — ABNORMAL HIGH (ref 70–99)
Phosphorus: 3.5 mg/dL (ref 2.5–4.6)
Potassium: 2.7 mmol/L — CL (ref 3.5–5.1)
Sodium: 136 mmol/L (ref 135–145)

## 2023-04-01 LAB — PROTEIN / CREATININE RATIO, URINE
Creatinine, Urine: 96 mg/dL
Protein Creatinine Ratio: 0.52 mg/mg{Cre} — ABNORMAL HIGH (ref 0.00–0.15)
Total Protein, Urine: 50 mg/dL

## 2023-04-01 LAB — CBC WITH DIFFERENTIAL/PLATELET
Abs Immature Granulocytes: 0.02 10*3/uL (ref 0.00–0.07)
Basophils Absolute: 0 10*3/uL (ref 0.0–0.1)
Basophils Relative: 1 %
Eosinophils Absolute: 0.1 10*3/uL (ref 0.0–0.5)
Eosinophils Relative: 1 %
HCT: 33.6 % — ABNORMAL LOW (ref 36.0–46.0)
Hemoglobin: 10.5 g/dL — ABNORMAL LOW (ref 12.0–15.0)
Immature Granulocytes: 0 %
Lymphocytes Relative: 21 %
Lymphs Abs: 1.4 10*3/uL (ref 0.7–4.0)
MCH: 27.5 pg (ref 26.0–34.0)
MCHC: 31.3 g/dL (ref 30.0–36.0)
MCV: 88 fL (ref 80.0–100.0)
Monocytes Absolute: 0.6 10*3/uL (ref 0.1–1.0)
Monocytes Relative: 9 %
Neutro Abs: 4.6 10*3/uL (ref 1.7–7.7)
Neutrophils Relative %: 68 %
Platelets: 252 10*3/uL (ref 150–400)
RBC: 3.82 MIL/uL — ABNORMAL LOW (ref 3.87–5.11)
RDW: 15.2 % (ref 11.5–15.5)
WBC: 6.6 10*3/uL (ref 4.0–10.5)
nRBC: 0 % (ref 0.0–0.2)

## 2023-04-01 LAB — POCT HEMOGLOBIN-HEMACUE: Hemoglobin: 10.6 g/dL — ABNORMAL LOW (ref 12.0–15.0)

## 2023-04-01 MED ORDER — EPOETIN ALFA-EPBX 4000 UNIT/ML IJ SOLN: 3000.0000 [IU] | Freq: Once | INTRAMUSCULAR | Status: DC

## 2023-04-01 NOTE — Telephone Encounter (Signed)
Called and spoke with Tiffany at Dr. Lucio Edward office ([facility], [phone]). Obtained verbal order to draw renal panel, CBC with diff, and to obtain urine specimen for protein-creatinine lab today at patient's visit. Confirmed orders via read back. Tiffany will also fax orders.   Wyvonne Lenz, RN

## 2023-04-01 NOTE — Progress Notes (Addendum)
Diagnosis: Anemia in Chronic Kidney Disease  Provider:  Manpreet Bhutani MD  Procedure: Injection  Retacrit (epoetin alfa-epbx), Dose: 3000 Units, Site: subcutaneous, Number of injections: 0  Hgb 10.6. Injection not administered.  Post Care: Patient declined observation  Discharge: Condition: Good, Destination: Home . AVS declined.  Performed by:  Wyvonne Lenz, RN

## 2023-04-01 NOTE — Telephone Encounter (Signed)
Call received from Dr. Lucio Edward office ([facility], [phone]), to confirm whether patient was still at [facility]; stated patient's lab work had come back with critical value. This RN confirmed that potassium was 2.7, and replied that patient was no longer here; provided additional contact information for patient. Staff stated they would attempt to call patient so that she could be seen by Dr. Wolfgang Phoenix today.  Subsequently received call from Theodosia in [facility], reporting critical value of potassium 2.7. Verbalized understanding and relayed that I had already spoken with Dr. Lucio Edward office.  Wyvonne Lenz, RN

## 2023-04-01 NOTE — Addendum Note (Signed)
Encounter addended by: Wyvonne Lenz, RN on: 04/01/2023 10:56 AM  Actions taken: Therapy plan modified

## 2023-04-01 NOTE — Addendum Note (Signed)
Encounter addended by: Tera Mater, [facility] on: 04/01/2023 11:25 AM  Actions taken: Order list changed

## 2023-04-09 ENCOUNTER — Other Ambulatory Visit: Payer: Self-pay | Admitting: Family Medicine

## 2023-04-09 ENCOUNTER — Ambulatory Visit (INDEPENDENT_AMBULATORY_CARE_PROVIDER_SITE_OTHER): Payer: Medicare HMO | Admitting: Family Medicine

## 2023-04-09 VITALS — BP 110/44 | HR 84 | Temp 97.7°F | Ht 64.0 in | Wt 147.0 lb

## 2023-04-09 DIAGNOSIS — E782 Mixed hyperlipidemia: Secondary | ICD-10-CM

## 2023-04-09 DIAGNOSIS — I10 Essential (primary) hypertension: Secondary | ICD-10-CM

## 2023-04-09 DIAGNOSIS — F17210 Nicotine dependence, cigarettes, uncomplicated: Secondary | ICD-10-CM | POA: Diagnosis not present

## 2023-04-09 DIAGNOSIS — N184 Chronic kidney disease, stage 4 (severe): Secondary | ICD-10-CM

## 2023-04-09 NOTE — Patient Instructions (Signed)
Lab today.  Follow up in 3 months.  Call with concerns.

## 2023-04-10 LAB — BASIC METABOLIC PANEL
BUN/Creatinine Ratio: 14 (ref 12–28)
BUN: 33 mg/dL — ABNORMAL HIGH (ref 8–27)
CO2: 22 mmol/L (ref 20–29)
Calcium: 9.6 mg/dL (ref 8.7–10.3)
Chloride: 103 mmol/L (ref 96–106)
Creatinine, Ser: 2.28 mg/dL — ABNORMAL HIGH (ref 0.57–1.00)
Glucose: 107 mg/dL — ABNORMAL HIGH (ref 70–99)
Potassium: 4.5 mmol/L (ref 3.5–5.2)
Sodium: 141 mmol/L (ref 134–144)
eGFR: 22 mL/min/{1.73_m2} — ABNORMAL LOW (ref >59)

## 2023-04-10 NOTE — Assessment & Plan Note (Signed)
We discussed smoking cessation today.  Patient states that she is going to work on this.

## 2023-04-10 NOTE — Assessment & Plan Note (Signed)
Patient had a recent upper kalemia.  Rechecking metabolic panel today.  Needs close follow-up with nephrology.

## 2023-04-10 NOTE — Assessment & Plan Note (Signed)
Patient reports her home readings are typically more elevated than it is here today.  Continue current pharmacotherapy.  No changes at this time.

## 2023-04-10 NOTE — Progress Notes (Signed)
Subjective:  Patient ID: Deborah Gomez, female    DOB: 1946-08-09  Age: 77 y.o. MRN: [id number]  CC:  Follow up  HPI:  77 year old female with hypertension, bronchiectasis, Gorder, CKD stage IV, hyperlipidemia, tobacco abuse presents for follow-up.  Patient's blood pressure is well-controlled.  Diastolic slightly low today.  She is asymptomatic.  She is on losartan, nifedipine, hydralazine, and Lasix.  She is followed by nephrology closely.  Patient states that overall she is feeling well.  Her appetite is back to normal.  She has recently seen GI regarding positive Cologuard.  Patient's lipids are well-controlled on Crestor.  Patient still smokes.  We will discuss smoking cessation today.  Patient Active Problem List   Diagnosis Date Noted   Cigarette smoker 02/06/2023   Bronchiectasis without complication (HCC) 02/06/2023   Aortic valve sclerosis 04/11/2022   Diastolic dysfunction 04/11/2022   CKD (chronic kidney disease) stage 4, GFR 15-29 ml/min (HCC) 01/09/2022   Anemia of chronic disease 01/09/2022   Goiter 01/09/2022   Positive colorectal cancer screening using Cologuard test 12/19/2021   HLD (hyperlipidemia) 08/16/2021   Vitamin D deficiency 06/06/2021   Hypertension 05/15/2021    Social Hx   Social History   Socioeconomic History   Marital status: Significant Other    Spouse name: Not on file   Number of children: 2   Years of education: Not on file   Highest education level: Not on file  Occupational History   Occupation: Retired    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  Tobacco Use   Smoking status: Every Day    Packs/day: 0.25    Years: 52.00    Additional pack years: 0.00    Total pack years: 13.00    Types: Cigarettes    Last attempt to quit: 08/21/2021    Years since quitting: 1.6   Smokeless tobacco: Never   Tobacco comments:    Smokes about 4 cigarettes per day   Vaping Use   Vaping Use: Never used  Substance and Sexual  Activity   Alcohol use: Not Currently   Drug use: Never   Sexual activity: Yes    Birth control/protection: Post-menopausal  Other Topics Concern   Not on file  Social History Narrative   1 child in [place], the other is not nearby   Social Determinants of Health   Financial Resource Strain: Low Risk  (08/22/2022)   Overall Financial Resource Strain (CARDIA)    Difficulty of Paying Living Expenses: Not hard at all  Food Insecurity: No Food Insecurity (08/22/2022)   Hunger Vital Sign    Worried About Running Out of Food in the Last Year: Never true    Ran Out of Food in the Last Year: Never true  Transportation Needs: No Transportation Needs (08/22/2022)   PRAPARE - Administrator, Civil Service (Medical): No    Lack of Transportation (Non-Medical): No  Physical Activity: Insufficiently Active (08/22/2022)   Exercise Vital Sign    Days of Exercise per Week: 3 days    Minutes of Exercise per Session: 30 min  Stress: No Stress Concern Present (08/22/2022)   [employer] of Occupational Health - Occupational Stress Questionnaire    Feeling of Stress : Not at all  Social Connections: Socially Isolated (08/22/2022)   Social Connection and Isolation Panel [NHANES]    Frequency of Communication with Friends and Family: Twice a week    Frequency of Social Gatherings with Friends and Family: Once a week  Attends Religious Services: Never    Active Member of Clubs or Organizations: No    Attends Banker Meetings: Never    Marital Status: Widowed    Review of Systems  Constitutional: Negative.   Respiratory: Negative.    Cardiovascular: Negative.     Objective:  BP (!) 110/44   Pulse 84   Temp 97.7 F (36.5 C)   Ht 5\' 4"  (1.626 m)   Wt 147 lb (66.7 kg)   SpO2 98%   BMI 25.23 kg/m      04/09/2023    9:42 AM 04/01/2023   10:01 AM 04/01/2023    9:44 AM  BP/Weight  Systolic BP 110 123 134  Diastolic BP 44 56 52  Wt. (Lbs) 147    BMI 25.23 kg/m2       Physical Exam Vitals and nursing note reviewed.  Constitutional:      General: She is not in acute distress.    Appearance: Normal appearance.  HENT:     Head: Normocephalic and atraumatic.  Eyes:     General:        Right eye: No discharge.        Left eye: No discharge.     Conjunctiva/sclera: Conjunctivae normal.  Cardiovascular:     Rate and Rhythm: Normal rate and regular rhythm.  Pulmonary:     Effort: Pulmonary effort is normal.     Breath sounds: Normal breath sounds. No wheezing, rhonchi or rales.  Abdominal:     General: There is no distension.     Palpations: Abdomen is soft.     Tenderness: There is no abdominal tenderness.  Neurological:     Mental Status: She is alert.  Psychiatric:        Mood and Affect: Mood normal.        Behavior: Behavior normal.     Lab Results  Component Value Date   WBC 6.6 04/01/2023   HGB 10.6 (L) 04/01/2023   HCT 33.6 (L) 04/01/2023   PLT 252 04/01/2023   GLUCOSE 107 (H) 04/09/2023   CHOL 98 01/09/2023   TRIG 96 01/09/2023   HDL 44 01/09/2023   LDLCALC 35 01/09/2023   ALT 34 01/15/2023   AST 50 (H) 01/15/2023   NA 141 04/09/2023   K 4.5 04/09/2023   CL 103 04/09/2023   CREATININE 2.28 (H) 04/09/2023   BUN 33 (H) 04/09/2023   CO2 22 04/09/2023   TSH 0.689 12/25/2022   HGBA1C 5.9 (H) 01/09/2023   MICROALBUR 236.0 (H) 01/09/2023     Assessment & Plan:   Problem List Items Addressed This Visit       Cardiovascular and Mediastinum   Hypertension    Patient reports her home readings are typically more elevated than it is here today.  Continue current pharmacotherapy.  No changes at this time.        Genitourinary   CKD (chronic kidney disease) stage 4, GFR 15-29 ml/min (HCC) - Primary    Patient had a recent upper kalemia.  Rechecking metabolic panel today.  Needs close follow-up with nephrology.      Relevant Orders   Basic Metabolic Panel     Other   Cigarette smoker    We discussed smoking  cessation today.  Patient states that she is going to work on this.      HLD (hyperlipidemia)    Well-controlled on Crestor.  Continue.       Follow-up:  3 months  Saim Almanza  Adriana Simas DO [facility]

## 2023-04-10 NOTE — Assessment & Plan Note (Signed)
Well-controlled on Crestor.  Continue. 

## 2023-04-16 ENCOUNTER — Encounter (HOSPITAL_COMMUNITY)
Admission: RE | Admit: 2023-04-16 | Discharge: 2023-04-16 | Disposition: A | Discharge status: Home / Self Care | Payer: Medicare HMO | Source: Ambulatory Visit | Attending: Nephrology | Admitting: Nephrology

## 2023-04-16 VITALS — BP 147/53 | Temp 98.0°F | Resp 16

## 2023-04-16 DIAGNOSIS — D638 Anemia in other chronic diseases classified elsewhere: Secondary | ICD-10-CM | POA: Diagnosis not present

## 2023-04-16 DIAGNOSIS — N184 Chronic kidney disease, stage 4 (severe): Secondary | ICD-10-CM | POA: Diagnosis not present

## 2023-04-16 LAB — POCT HEMOGLOBIN-HEMACUE: Hemoglobin: 10.4 g/dL — ABNORMAL LOW (ref 12.0–15.0)

## 2023-04-16 MED ORDER — EPOETIN ALFA-EPBX 3000 UNIT/ML IJ SOLN: 3000.0000 [IU] | Freq: Once | INTRAMUSCULAR | Status: DC

## 2023-04-16 NOTE — Progress Notes (Signed)
Diagnosis: Anemia in Chronic Kidney Disease  Provider:  Manpreet Bhutani MD  Procedure: Injection  Retacrit (epoetin alfa-epbx), Dose: 3000 Units, Site: subcutaneous, Number of injections: 0  Hgb 10.4. Injection not administered.  Post Care: Patient declined observation  Discharge: Condition: Good, Destination: Home . AVS provided.  Performed by:  Wyvonne Lenz, RN

## 2023-04-16 NOTE — Addendum Note (Signed)
Encounter addended by: Wyvonne Lenz, RN on: 04/16/2023 3:09 PM  Actions taken: Therapy plan modified

## 2023-04-17 ENCOUNTER — Other Ambulatory Visit: Payer: Self-pay | Admitting: Family Medicine

## 2023-04-17 DIAGNOSIS — N184 Chronic kidney disease, stage 4 (severe): Secondary | ICD-10-CM

## 2023-04-25 ENCOUNTER — Other Ambulatory Visit (HOSPITAL_COMMUNITY)
Admission: RE | Admit: 2023-04-25 | Discharge: 2023-04-25 | Disposition: A | Discharge status: Home / Self Care | Payer: Medicare HMO | Source: Ambulatory Visit | Attending: Nephrology | Admitting: Nephrology

## 2023-04-25 DIAGNOSIS — E1122 Type 2 diabetes mellitus with diabetic chronic kidney disease: Secondary | ICD-10-CM | POA: Insufficient documentation

## 2023-04-25 DIAGNOSIS — N189 Chronic kidney disease, unspecified: Secondary | ICD-10-CM | POA: Insufficient documentation

## 2023-04-25 DIAGNOSIS — D631 Anemia in chronic kidney disease: Secondary | ICD-10-CM | POA: Insufficient documentation

## 2023-04-25 DIAGNOSIS — N2581 Secondary hyperparathyroidism of renal origin: Secondary | ICD-10-CM | POA: Insufficient documentation

## 2023-04-25 DIAGNOSIS — I129 Hypertensive chronic kidney disease with stage 1 through stage 4 chronic kidney disease, or unspecified chronic kidney disease: Secondary | ICD-10-CM | POA: Diagnosis not present

## 2023-04-25 DIAGNOSIS — R809 Proteinuria, unspecified: Secondary | ICD-10-CM | POA: Diagnosis not present

## 2023-04-25 DIAGNOSIS — E876 Hypokalemia: Secondary | ICD-10-CM | POA: Insufficient documentation

## 2023-04-25 LAB — RENAL FUNCTION PANEL
Albumin: 3.7 g/dL (ref 3.5–5.0)
Anion gap: 9 (ref 5–15)
BUN: 31 mg/dL — ABNORMAL HIGH (ref 8–23)
CO2: 22 mmol/L (ref 22–32)
Calcium: 9.1 mg/dL (ref 8.9–10.3)
Chloride: 105 mmol/L (ref 98–111)
Creatinine, Ser: 2.33 mg/dL — ABNORMAL HIGH (ref 0.44–1.00)
GFR, Estimated: 21 mL/min — ABNORMAL LOW (ref >60)
Glucose, Bld: 108 mg/dL — ABNORMAL HIGH (ref 70–99)
Phosphorus: 3.4 mg/dL (ref 2.5–4.6)
Potassium: 3.7 mmol/L (ref 3.5–5.1)
Sodium: 136 mmol/L (ref 135–145)

## 2023-04-25 LAB — CBC WITH DIFFERENTIAL/PLATELET
Abs Immature Granulocytes: 0.02 10*3/uL (ref 0.00–0.07)
Basophils Absolute: 0.1 10*3/uL (ref 0.0–0.1)
Basophils Relative: 1 %
Eosinophils Absolute: 0.1 10*3/uL (ref 0.0–0.5)
Eosinophils Relative: 2 %
HCT: 33.5 % — ABNORMAL LOW (ref 36.0–46.0)
Hemoglobin: 10.4 g/dL — ABNORMAL LOW (ref 12.0–15.0)
Immature Granulocytes: 0 %
Lymphocytes Relative: 28 %
Lymphs Abs: 1.9 10*3/uL (ref 0.7–4.0)
MCH: 27.4 pg (ref 26.0–34.0)
MCHC: 31 g/dL (ref 30.0–36.0)
MCV: 88.2 fL (ref 80.0–100.0)
Monocytes Absolute: 0.5 10*3/uL (ref 0.1–1.0)
Monocytes Relative: 8 %
Neutro Abs: 4 10*3/uL (ref 1.7–7.7)
Neutrophils Relative %: 61 %
Platelets: 250 10*3/uL (ref 150–400)
RBC: 3.8 MIL/uL — ABNORMAL LOW (ref 3.87–5.11)
RDW: 14.7 % (ref 11.5–15.5)
WBC: 6.6 10*3/uL (ref 4.0–10.5)
nRBC: 0 % (ref 0.0–0.2)

## 2023-04-25 LAB — IRON AND TIBC
Iron: 65 ug/dL (ref 28–170)
Saturation Ratios: 24 % (ref 10.4–31.8)
TIBC: 270 ug/dL (ref 250–450)
UIBC: 205 ug/dL

## 2023-04-25 LAB — FERRITIN: Ferritin: 111 ng/mL (ref 11–307)

## 2023-04-25 LAB — PROTEIN / CREATININE RATIO, URINE
Creatinine, Urine: 136 mg/dL
Protein Creatinine Ratio: 0.46 mg/mg{Cre} — ABNORMAL HIGH (ref 0.00–0.15)
Total Protein, Urine: 62 mg/dL

## 2023-04-27 LAB — PTH, INTACT AND CALCIUM
Calcium, Total (PTH): 9.1 mg/dL (ref 8.7–10.3)
PTH: 45 pg/mL (ref 15–65)

## 2023-04-29 ENCOUNTER — Telehealth (HOSPITAL_COMMUNITY): Payer: Self-pay

## 2023-04-29 ENCOUNTER — Encounter (HOSPITAL_COMMUNITY): Payer: Medicare HMO

## 2023-04-29 NOTE — Telephone Encounter (Signed)
Called [facility] [phone]) and spoke with Liborio Nixon. Relayed that patient's hemoglobin was 10.4 on blood draw collected on 04/25/23. Called patient and cancelled today's injection appointment at [facility], and confirmed patient's next appointment on 6/24. Liborio Nixon verbalized understanding and agreement. Infusion pharmacy team also confirmed.  Wyvonne Lenz, RN

## 2023-04-30 ENCOUNTER — Ambulatory Visit (HOSPITAL_COMMUNITY)
Admission: RE | Admit: 2023-04-30 | Discharge: 2023-04-30 | Disposition: A | Discharge status: Home / Self Care | Payer: Medicare HMO | Source: Ambulatory Visit | Attending: Internal Medicine | Admitting: Internal Medicine

## 2023-04-30 DIAGNOSIS — R0609 Other forms of dyspnea: Secondary | ICD-10-CM | POA: Diagnosis not present

## 2023-04-30 DIAGNOSIS — R942 Abnormal results of pulmonary function studies: Secondary | ICD-10-CM | POA: Diagnosis not present

## 2023-04-30 DIAGNOSIS — F17210 Nicotine dependence, cigarettes, uncomplicated: Secondary | ICD-10-CM | POA: Diagnosis not present

## 2023-04-30 LAB — PULMONARY FUNCTION TEST
DL/VA % pred: 108 %
DL/VA: 4.48 ml/min/mmHg/L
DLCO cor % pred: 79 %
DLCO cor: 14.95 ml/min/mmHg
DLCO unc % pred: 71 %
DLCO unc: 13.37 ml/min/mmHg
FEF 25-75 Post: 2.93 L/sec
FEF 25-75 Pre: 2.66 L/sec
FEF2575-%Change-Post: 10 %
FEF2575-%Pred-Post: 187 %
FEF2575-%Pred-Pre: 170 %
FEV1-%Change-Post: -4 %
FEV1-%Pred-Post: 99 %
FEV1-%Pred-Pre: 104 %
FEV1-Post: 2.04 L
FEV1-Pre: 2.13 L
FEV1FVC-%Change-Post: 2 %
FEV1FVC-%Pred-Pre: 118 %
FEV6-%Change-Post: -6 %
FEV6-%Pred-Post: 86 %
FEV6-%Pred-Pre: 92 %
FEV6-Post: 2.24 L
FEV6-Pre: 2.4 L
FEV6FVC-%Pred-Post: 105 %
FEV6FVC-%Pred-Pre: 105 %
FVC-%Change-Post: -6 %
FVC-%Pred-Post: 81 %
FVC-%Pred-Pre: 87 %
FVC-Post: 2.24 L
FVC-Pre: 2.4 L
Post FEV1/FVC ratio: 91 %
Post FEV6/FVC ratio: 100 %
Pre FEV1/FVC ratio: 89 %
Pre FEV6/FVC Ratio: 100 %
RV % pred: 80 %
RV: 1.87 L
TLC % pred: 81 %
TLC: 4.1 L

## 2023-04-30 MED ORDER — ALBUTEROL SULFATE (2.5 MG/3ML) 0.083% IN NEBU
2.5000 mg | INHALATION_SOLUTION | Freq: Once | RESPIRATORY_TRACT | Status: AC
  Administered 2023-04-30: 2.5 mg via RESPIRATORY_TRACT

## 2023-05-08 NOTE — Progress Notes (Signed)
Tried calling the pt and there was no answer and VM not set up yet. Will try back later.

## 2023-05-09 DIAGNOSIS — N184 Chronic kidney disease, stage 4 (severe): Secondary | ICD-10-CM | POA: Diagnosis not present

## 2023-05-09 DIAGNOSIS — I10 Essential (primary) hypertension: Secondary | ICD-10-CM | POA: Diagnosis not present

## 2023-05-09 DIAGNOSIS — I129 Hypertensive chronic kidney disease with stage 1 through stage 4 chronic kidney disease, or unspecified chronic kidney disease: Secondary | ICD-10-CM | POA: Diagnosis not present

## 2023-05-09 DIAGNOSIS — D638 Anemia in other chronic diseases classified elsewhere: Secondary | ICD-10-CM | POA: Diagnosis not present

## 2023-05-09 DIAGNOSIS — N2581 Secondary hyperparathyroidism of renal origin: Secondary | ICD-10-CM | POA: Diagnosis not present

## 2023-05-13 ENCOUNTER — Telehealth (HOSPITAL_COMMUNITY): Payer: Self-pay | Admitting: Emergency Medicine

## 2023-05-13 ENCOUNTER — Encounter (HOSPITAL_COMMUNITY): Payer: Medicare HMO

## 2023-05-13 NOTE — Telephone Encounter (Signed)
Spoke with Dr Thedore Mins office, will not continue injections at this time.  Will send a referral when they want to restart treatment.

## 2023-05-13 NOTE — Telephone Encounter (Signed)
Called Deborah Gomez to let her know that Dr Doristine Church office was not going to continue her injections at this time.  They will send a referral when she needs to restart and we will get her scheduled then.  Verbalized understanding.

## 2023-05-16 ENCOUNTER — Other Ambulatory Visit: Payer: Self-pay | Admitting: Family Medicine

## 2023-05-16 ENCOUNTER — Other Ambulatory Visit: Payer: Self-pay | Admitting: Cardiology

## 2023-05-16 DIAGNOSIS — E1122 Type 2 diabetes mellitus with diabetic chronic kidney disease: Secondary | ICD-10-CM

## 2023-05-20 ENCOUNTER — Other Ambulatory Visit: Payer: Self-pay | Admitting: Family Medicine

## 2023-05-20 DIAGNOSIS — E785 Hyperlipidemia, unspecified: Secondary | ICD-10-CM

## 2023-05-26 ENCOUNTER — Encounter (HOSPITAL_COMMUNITY): Payer: Self-pay | Admitting: Nephrology

## 2023-05-27 ENCOUNTER — Encounter (HOSPITAL_COMMUNITY): Payer: Medicare HMO

## 2023-06-03 ENCOUNTER — Encounter (HOSPITAL_COMMUNITY): Payer: Self-pay | Admitting: Nephrology

## 2023-06-05 ENCOUNTER — Other Ambulatory Visit: Payer: Self-pay

## 2023-06-10 ENCOUNTER — Encounter (HOSPITAL_COMMUNITY): Admission: RE | Admit: 2023-06-10 | Payer: Medicare HMO | Source: Ambulatory Visit

## 2023-06-17 ENCOUNTER — Other Ambulatory Visit: Payer: Self-pay

## 2023-06-20 ENCOUNTER — Other Ambulatory Visit: Payer: Self-pay | Admitting: Family Medicine

## 2023-06-20 DIAGNOSIS — N184 Chronic kidney disease, stage 4 (severe): Secondary | ICD-10-CM

## 2023-07-01 ENCOUNTER — Other Ambulatory Visit (HOSPITAL_COMMUNITY)
Admission: RE | Admit: 2023-07-01 | Discharge: 2023-07-01 | Disposition: A | Discharge status: Home / Self Care | Payer: Medicare PPO | Source: Ambulatory Visit | Attending: Nephrology | Admitting: Nephrology

## 2023-07-01 DIAGNOSIS — N189 Chronic kidney disease, unspecified: Secondary | ICD-10-CM | POA: Insufficient documentation

## 2023-07-01 DIAGNOSIS — D631 Anemia in chronic kidney disease: Secondary | ICD-10-CM | POA: Diagnosis not present

## 2023-07-01 DIAGNOSIS — R809 Proteinuria, unspecified: Secondary | ICD-10-CM | POA: Insufficient documentation

## 2023-07-01 LAB — CBC
HCT: 33 % — ABNORMAL LOW (ref 36.0–46.0)
Hemoglobin: 10.2 g/dL — ABNORMAL LOW (ref 12.0–15.0)
MCH: 27.6 pg (ref 26.0–34.0)
MCHC: 30.9 g/dL (ref 30.0–36.0)
MCV: 89.2 fL (ref 80.0–100.0)
Platelets: 227 10*3/uL (ref 150–400)
RBC: 3.7 MIL/uL — ABNORMAL LOW (ref 3.87–5.11)
RDW: 15.2 % (ref 11.5–15.5)
WBC: 7.1 10*3/uL (ref 4.0–10.5)
nRBC: 0 % (ref 0.0–0.2)

## 2023-07-01 LAB — RENAL FUNCTION PANEL
Albumin: 3.6 g/dL (ref 3.5–5.0)
Anion gap: 8 (ref 5–15)
BUN: 32 mg/dL — ABNORMAL HIGH (ref 8–23)
CO2: 27 mmol/L (ref 22–32)
Calcium: 9.2 mg/dL (ref 8.9–10.3)
Chloride: 101 mmol/L (ref 98–111)
Creatinine, Ser: 1.87 mg/dL — ABNORMAL HIGH (ref 0.44–1.00)
GFR, Estimated: 27 mL/min — ABNORMAL LOW (ref >60)
Glucose, Bld: 117 mg/dL — ABNORMAL HIGH (ref 70–99)
Phosphorus: 3.1 mg/dL (ref 2.5–4.6)
Potassium: 3.9 mmol/L (ref 3.5–5.1)
Sodium: 136 mmol/L (ref 135–145)

## 2023-07-01 LAB — PROTEIN / CREATININE RATIO, URINE
Creatinine, Urine: 64 mg/dL
Protein Creatinine Ratio: 0.23 mg/mg{Cre} — ABNORMAL HIGH (ref 0.00–0.15)
Total Protein, Urine: 15 mg/dL

## 2023-07-08 DIAGNOSIS — E1122 Type 2 diabetes mellitus with diabetic chronic kidney disease: Secondary | ICD-10-CM | POA: Diagnosis not present

## 2023-07-08 DIAGNOSIS — N179 Acute kidney failure, unspecified: Secondary | ICD-10-CM | POA: Diagnosis not present

## 2023-07-08 DIAGNOSIS — D631 Anemia in chronic kidney disease: Secondary | ICD-10-CM | POA: Diagnosis not present

## 2023-07-08 DIAGNOSIS — R808 Other proteinuria: Secondary | ICD-10-CM | POA: Diagnosis not present

## 2023-07-11 ENCOUNTER — Ambulatory Visit: Payer: Medicare HMO | Admitting: Family Medicine

## 2023-07-30 ENCOUNTER — Ambulatory Visit: Payer: Medicare PPO | Admitting: Family Medicine

## 2023-07-30 ENCOUNTER — Encounter: Payer: Self-pay | Admitting: Family Medicine

## 2023-07-30 VITALS — BP 112/54 | HR 66 | Temp 98.2°F | Wt 146.8 lb

## 2023-07-30 DIAGNOSIS — E782 Mixed hyperlipidemia: Secondary | ICD-10-CM | POA: Diagnosis not present

## 2023-07-30 DIAGNOSIS — N184 Chronic kidney disease, stage 4 (severe): Secondary | ICD-10-CM

## 2023-07-30 DIAGNOSIS — R195 Other fecal abnormalities: Secondary | ICD-10-CM | POA: Diagnosis not present

## 2023-07-30 DIAGNOSIS — I10 Essential (primary) hypertension: Secondary | ICD-10-CM

## 2023-07-30 NOTE — Assessment & Plan Note (Signed)
Follows with nephrology.

## 2023-07-30 NOTE — Progress Notes (Signed)
Subjective:  Patient ID: Deborah Gomez, female    DOB: [birth year]  Age: 77 y.o. MRN: [id number]  CC: Follow up   HPI:  77 year old female with CKD 4 and associated anemia, diastolic dysfunction, tobacco abuse, hyperlipidemia, hypertension presents for follow-up.  Blood pressure well-controlled on Lasix, hydralazine, losartan, and nifedipine.  Lipids well-controlled on Crestor.  Patient has had a positive Cologuard.  She has not proceeded with colonoscopy.  Will revisit again today.  Patient declines vaccines.  Patient Active Problem List   Diagnosis Date Noted   Cigarette smoker 02/06/2023   Bronchiectasis without complication (HCC) 02/06/2023   Aortic valve sclerosis 04/11/2022   Diastolic dysfunction 04/11/2022   CKD (chronic kidney disease) stage 4, GFR 15-29 ml/min (HCC) 01/09/2022   Anemia of chronic disease 01/09/2022   Goiter 01/09/2022   Positive colorectal cancer screening using Cologuard test 12/19/2021   HLD (hyperlipidemia) 08/16/2021   Vitamin D deficiency 06/06/2021   Hypertension 05/15/2021    Social Hx   Social History   Socioeconomic History   Marital status: Significant Other    Spouse name: Not on file   Number of children: 2   Years of education: Not on file   Highest education level: Not on file  Occupational History   Occupation: Retired    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  Tobacco Use   Smoking status: Every Day    Current packs/day: 0.00    Average packs/day: 0.3 packs/day for 52.0 years (13.0 ttl pk-yrs)    Types: Cigarettes    Start date: 08/21/1969    Last attempt to quit: 08/21/2021    Years since quitting: 1.9   Smokeless tobacco: Never   Tobacco comments:    Smokes about 4 cigarettes per day   Vaping Use   Vaping status: Never Used  Substance and Sexual Activity   Alcohol use: Not Currently   Drug use: Never   Sexual activity: Yes    Birth control/protection: Post-menopausal  Other Topics Concern   Not  on file  Social History Narrative   1 child in [place], the other is not nearby   Social Determinants of Health   Financial Resource Strain: Low Risk  (08/22/2022)   Overall Financial Resource Strain (CARDIA)    Difficulty of Paying Living Expenses: Not hard at all  Food Insecurity: No Food Insecurity (08/22/2022)   Hunger Vital Sign    Worried About Running Out of Food in the Last Year: Never true    Ran Out of Food in the Last Year: Never true  Transportation Needs: No Transportation Needs (08/22/2022)   PRAPARE - Administrator, Civil Service (Medical): No    Lack of Transportation (Non-Medical): No  Physical Activity: Insufficiently Active (08/22/2022)   Exercise Vital Sign    Days of Exercise per Week: 3 days    Minutes of Exercise per Session: 30 min  Stress: No Stress Concern Present (08/22/2022)   [employer] of Occupational Health - Occupational Stress Questionnaire    Feeling of Stress : Not at all  Social Connections: Socially Isolated (08/22/2022)   Social Connection and Isolation Panel [NHANES]    Frequency of Communication with Friends and Family: Twice a week    Frequency of Social Gatherings with Friends and Family: Once a week    Attends Religious Services: Never    Database administrator or Organizations: No    Attends Banker Meetings: Never    Marital Status: Widowed  Review of Systems  Constitutional:  Positive for fatigue.  Respiratory: Negative.    Cardiovascular: Negative.     Objective:  BP (!) 112/54   Pulse 66   Temp 98.2 F (36.8 C) (Oral)   Wt 146 lb 12.8 oz (66.6 kg)   SpO2 100%   BMI 25.20 kg/m      07/30/2023   10:07 AM 04/16/2023    9:35 AM 04/09/2023    9:42 AM  BP/Weight  Systolic BP 112 147 110  Diastolic BP 54 53 44  Wt. (Lbs) 146.8  147  BMI 25.2 kg/m2  25.23 kg/m2    Physical Exam Vitals and nursing note reviewed.  Constitutional:      General: She is not in acute distress.    Appearance:  Normal appearance.  HENT:     Head: Normocephalic and atraumatic.  Eyes:     General:        Right eye: No discharge.        Left eye: No discharge.     Conjunctiva/sclera: Conjunctivae normal.  Cardiovascular:     Rate and Rhythm: Normal rate and regular rhythm.     Heart sounds: Murmur heard.  Pulmonary:     Effort: Pulmonary effort is normal.     Breath sounds: Normal breath sounds. No wheezing, rhonchi or rales.  Neurological:     Mental Status: She is alert.     Lab Results  Component Value Date   WBC 7.1 07/01/2023   HGB 10.2 (L) 07/01/2023   HCT 33.0 (L) 07/01/2023   PLT 227 07/01/2023   GLUCOSE 117 (H) 07/01/2023   CHOL 98 01/09/2023   TRIG 96 01/09/2023   HDL 44 01/09/2023   LDLCALC 35 01/09/2023   ALT 34 01/15/2023   AST 50 (H) 01/15/2023   NA 136 07/01/2023   K 3.9 07/01/2023   CL 101 07/01/2023   CREATININE 1.87 (H) 07/01/2023   BUN 32 (H) 07/01/2023   CO2 27 07/01/2023   TSH 0.689 12/25/2022   HGBA1C 5.9 (H) 01/09/2023   MICROALBUR 236.0 (H) 01/09/2023     Assessment & Plan:   Problem List Items Addressed This Visit       Cardiovascular and Mediastinum   Hypertension - Primary    Stable.  Continue current medications.        Genitourinary   CKD (chronic kidney disease) stage 4, GFR 15-29 ml/min (HCC)    Follows with nephrology.        Other   HLD (hyperlipidemia)    At goal on Crestor.  Continue.      Positive colorectal cancer screening using Cologuard test    Once again, I recommended that she had a colonoscopy.  Patient does not want to proceed with this at this time.  She was advised of the risks particularly in regards to cancer.       Follow-up:  6 months  Meziah Blasingame Adriana Simas DO [facility]

## 2023-07-30 NOTE — Assessment & Plan Note (Signed)
At goal on Crestor.  Continue. 

## 2023-07-30 NOTE — Assessment & Plan Note (Signed)
Stable.  Continue current medications.

## 2023-07-30 NOTE — Patient Instructions (Signed)
Consider the colonoscopy.  Continue your medications.  Follow up in 6 months.

## 2023-07-30 NOTE — Assessment & Plan Note (Signed)
Once again, I recommended that she had a colonoscopy.  Patient does not want to proceed with this at this time.  She was advised of the risks particularly in regards to cancer.

## 2023-08-01 ENCOUNTER — Encounter: Payer: Self-pay | Admitting: Cardiology

## 2023-08-01 ENCOUNTER — Ambulatory Visit: Payer: Medicare PPO | Attending: Cardiology | Admitting: Cardiology

## 2023-08-01 VITALS — BP 140/70 | HR 78 | Ht 64.0 in | Wt 146.0 lb

## 2023-08-01 DIAGNOSIS — E782 Mixed hyperlipidemia: Secondary | ICD-10-CM | POA: Diagnosis not present

## 2023-08-01 DIAGNOSIS — I5189 Other ill-defined heart diseases: Secondary | ICD-10-CM

## 2023-08-01 DIAGNOSIS — R0989 Other specified symptoms and signs involving the circulatory and respiratory systems: Secondary | ICD-10-CM

## 2023-08-01 DIAGNOSIS — I10 Essential (primary) hypertension: Secondary | ICD-10-CM | POA: Diagnosis not present

## 2023-08-01 NOTE — Patient Instructions (Signed)
Medication Instructions:  Your physician recommends that you continue on your current medications as directed. Please refer to the Current Medication list given to you today.  *If you need a refill on your cardiac medications before your next appointment, please call your pharmacy*   Lab Work: None. If you have labs (blood work) drawn today and your tests are completely normal, you will receive your results only by: MyChart Message (if you have MyChart) OR A paper copy in the mail If you have any lab test that is abnormal or we need to change your treatment, we will call you to review the results.   Testing/Procedures: Your physician has requested that you have a carotid duplex. This test is an ultrasound of the carotid arteries in your neck. It looks at blood flow through these arteries that supply the brain with blood. Allow one hour for this exam. There are no restrictions or special instructions.    Follow-Up: At [facility], you and your health needs are our priority.  As part of our continuing mission to provide you with exceptional heart care, we have created designated Provider Care Teams.  These Care Teams include your primary Cardiologist (physician) and Advanced Practice Providers (APPs -  Physician Assistants and Nurse Practitioners) who all work together to provide you with the care you need, when you need it.  We recommend signing up for the patient portal called "MyChart".  Sign up information is provided on this After Visit Summary.  MyChart is used to connect with patients for Virtual Visits (Telemedicine).  Patients are able to view lab/test results, encounter notes, upcoming appointments, etc.  Non-urgent messages can be sent to your provider as well.   To learn more about what you can do with MyChart, go to [email or web address].    Your next appointment:   6 month(s)  Provider:   You may see Dina Rich, MD or one of the following Advanced Practice  Providers on your designated Care Team:   Randall An, PA-C  Jacolyn Reedy, [place]     Other Instructions Please bring your blood pressure log when you come for testing.

## 2023-08-01 NOTE — Progress Notes (Signed)
Clinical Summary Ms. Opsahl is a 77 y.o.female seen today for follow up of the following medical problems.   1.Chronic HFpEF  - 07/2021 echo: LVEF 65-70%, no WMAs, severe asymmetric LVH(on my measurement septum 1.5 cm and PW 1.3 cm), grade I DD. Normal RV, mild MR - infrequent edema.. No SOB/DOE   12/2022 echo: LVEF 65-70%, no WMAs, grade I dd, normal RV function  - no SOB/DOE. Some LE edema that is variable, overeall mild. Compliant with lasix.   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 Aortic sclerosis - mild to mod sclerosis, no significant stenosis. Mean grad 6 mmHg.  12/2022 echo: aortic sclerosis without stenosis    4. HTN - bp with pcp this week 112/54 - compliant with meds   5. Hyperlipidemia - 10/2021 TC 177 TG 81 HDL 92 LDL 70 - 12/2022 TC 98 TG 96 HDL 44 LDL 35 - she is on crestor 40mg  daily.     Past Medical History:  Diagnosis Date   Aortic valve sclerosis    Chronic kidney disease    CKD (chronic kidney disease), stage IV (HCC)    Diabetes mellitus (HCC)    Diastolic dysfunction    Hypertension    LVH (left ventricular hypertrophy)      No Known Allergies   Current Outpatient Medications  Medication Sig Dispense Refill   aspirin EC 81 MG tablet Take 81 mg by mouth daily. Swallow whole.     calcitRIOL (ROCALTROL) 0.25 MCG capsule Take 0.25 mcg by mouth daily.     epoetin alfa (EPOGEN) 3000 UNIT/ML injection Inject 3,000 Units into the skin every 14 (fourteen) days. Patient has not started medication as of 02/06/2023.     furosemide (LASIX) 20 MG tablet Take 1 tablet (20 mg total) by mouth 2 (two) times daily. 60 tablet 5   gabapentin (NEURONTIN) 100 MG capsule TAKE 1 CAPSULE BY MOUTH THREE TIMES DAILY 90 capsule 3   hydrALAZINE (APRESOLINE) 50 MG tablet Take 100 mg by mouth 2 (two) times daily.     losartan (COZAAR) 50 MG tablet Take 50 mg by mouth daily.     NIFEdipine (PROCARDIA XL/NIFEDICAL-XL) 90 MG 24 hr tablet Take 90 mg by mouth daily.      potassium chloride SA (KLOR-CON M) 20 MEQ tablet Take 20 mEq by mouth daily.     rosuvastatin (CRESTOR) 40 MG tablet Take 1 tablet by mouth once daily 90 tablet 0   No current facility-administered medications for this visit.     Past Surgical History:  Procedure Laterality Date   APPENDECTOMY     BREAST SURGERY Left    CARPAL TUNNEL RELEASE Right    CATARACT EXTRACTION W/PHACO Right 07/30/2022   Procedure: CATARACT EXTRACTION PHACO AND INTRAOCULAR LENS PLACEMENT (IOC);  Surgeon: Fabio Pierce, MD;  Location: [facility];  Service: Ophthalmology;  Laterality: Right;  CDE:19.32   CATARACT EXTRACTION W/PHACO Left 08/27/2022   Procedure: CATARACT EXTRACTION PHACO AND INTRAOCULAR LENS PLACEMENT (IOC);  Surgeon: Fabio Pierce, MD;  Location: [facility];  Service: Ophthalmology;  Laterality: Left;  CDE: 32.13   REPLACEMENT TOTAL KNEE Left    THYROID SURGERY     partial removal     No Known Allergies    Family History  Adopted: Yes  Problem Relation Age of Onset   Colon cancer Neg Hx    Colon polyps Neg Hx      Social History Ms. Mallik reports that she  has been smoking cigarettes. She started smoking about 53 years ago. She has a 13 pack-year smoking history. She has never used smokeless tobacco. Ms. Slisz reports that she does not currently use alcohol.   Review of Systems CONSTITUTIONAL: No weight loss, fever, chills, weakness or fatigue.  HEENT: Eyes: No visual loss, blurred vision, double vision or yellow sclerae.No hearing loss, sneezing, congestion, runny nose or sore throat.  SKIN: No rash or itching.  CARDIOVASCULAR: per hpi RESPIRATORY: No shortness of breath, cough or sputum.  GASTROINTESTINAL: No anorexia, nausea, vomiting or diarrhea. No abdominal pain or blood.  GENITOURINARY: No burning on urination, no polyuria NEUROLOGICAL: No headache, dizziness, syncope, paralysis, ataxia, numbness or tingling in the extremities. No change in bowel or bladder control.   MUSCULOSKELETAL: No muscle, back pain, joint pain or stiffness.  LYMPHATICS: No enlarged nodes. No history of splenectomy.  PSYCHIATRIC: No history of depression or anxiety.  ENDOCRINOLOGIC: No reports of sweating, cold or heat intolerance. No polyuria or polydipsia.  Marland Kitchen   Physical Examination Today's Vitals   08/01/23 1309 08/01/23 1349  BP: (!) 152/66 (!) 140/70  Pulse: 78   SpO2: 99%   Weight: 146 lb (66.2 kg)   Height: 5\' 4"  (1.626 m)    Body mass index is 25.06 kg/m.  Gen: resting comfortably, no acute distress HEENT: no scleral icterus, pupils equal round and reactive, no palptable cervical adenopathy,  CV: RRR, 2/6 systolic murmur rusb. Left carotid bruit Resp: Clear to auscultation bilaterally GI: abdomen is soft, non-tender, non-distended, normal bowel sounds, no hepatosplenomegaly MSK: extremities are warm, no edema.  Skin: warm, no rash Neuro:  no focal deficits Psych: appropriate affect   Diagnostic Studies  07/2021 echo IMPRESSIONS     1. Left ventricular ejection fraction, by estimation, is 65 to 70%. The  left ventricle has normal function. The left ventricle has no regional  wall motion abnormalities. There is severe asymmetric left ventricular  hypertrophy of the septal segment. Left   ventricular diastolic parameters are consistent with Grade I diastolic  dysfunction (impaired relaxation).   2. Right ventricular systolic function is normal. The right ventricular  size is normal. There is normal pulmonary artery systolic pressure. The  estimated right ventricular systolic pressure is 21.7 mmHg.   3. A small pericardial effusion is present. The pericardial effusion is  posterior to the left ventricle.   4. The mitral valve is grossly normal. Mild mitral valve regurgitation.   5. The aortic valve is tricuspid. There is moderate calcification of the  aortic valve. Aortic valve regurgitation is not visualized. Mild to  moderate aortic valve  sclerosis/calcification is present, without any  evidence of aortic stenosis.   6. The inferior vena cava is normal in size with greater than 50%  respiratory variability, suggesting right atrial pressure of 3 mmHg.   12/2022 echo IMPRESSIONS     1. Left ventricular ejection fraction, by estimation, is 65 to 70%. The  left ventricle has normal function. The left ventricle has no regional  wall motion abnormalities. There is moderate left ventricular hypertrophy.  Left ventricular diastolic  parameters are consistent with Grade I diastolic dysfunction (impaired  relaxation).   2. Right ventricular systolic function is normal. The right ventricular  size is normal. There is normal pulmonary artery systolic pressure.   3. The mitral valve is normal in structure. No evidence of mitral valve  regurgitation. No evidence of mitral stenosis.   4. The tricuspid valve is abnormal.   5. The  aortic valve is tricuspid. There is mild calcification of the  aortic valve. There is mild thickening of the aortic valve. Aortic valve  regurgitation is not visualized. No aortic stenosis is present.   6. The inferior vena cava is normal in size with greater than 50%  respiratory variability, suggesting right atrial pressure of 3 mmHg.   Assessment and Plan   Chronic HFpEF - volume status controlled with lasix, continue current meds   2.HLD - she is at goal, continue current meds   3. HTN - bp elevated here but well controlled at recent pcp visit - monitor bp's and submit bp log when she comes for carotid US  4. Carotid bruit - obtain carotid US     Antoine Poche, M.D.

## 2023-08-09 ENCOUNTER — Ambulatory Visit (HOSPITAL_COMMUNITY)
Admission: RE | Admit: 2023-08-09 | Discharge: 2023-08-09 | Disposition: A | Discharge status: Home / Self Care | Payer: Medicare PPO | Source: Ambulatory Visit | Attending: Cardiology | Admitting: Cardiology

## 2023-08-09 DIAGNOSIS — R0989 Other specified symptoms and signs involving the circulatory and respiratory systems: Secondary | ICD-10-CM | POA: Insufficient documentation

## 2023-08-09 DIAGNOSIS — I10 Essential (primary) hypertension: Secondary | ICD-10-CM | POA: Diagnosis not present

## 2023-08-09 DIAGNOSIS — E119 Type 2 diabetes mellitus without complications: Secondary | ICD-10-CM | POA: Diagnosis not present

## 2023-08-09 DIAGNOSIS — I6523 Occlusion and stenosis of bilateral carotid arteries: Secondary | ICD-10-CM | POA: Diagnosis not present

## 2023-08-19 ENCOUNTER — Other Ambulatory Visit: Payer: Self-pay | Admitting: Family Medicine

## 2023-08-19 DIAGNOSIS — E785 Hyperlipidemia, unspecified: Secondary | ICD-10-CM

## 2023-09-07 IMAGING — DX DG KNEE COMPLETE 4+V*L*
4 series · 4 of 4 positions shown · non-contrast
Comparison: None Available.

CLINICAL DATA: Left knee pain several weeks. Knee replacement 8
years ago

EXAM:
LEFT KNEE - COMPLETE 4+ VIEW

[knee ap]
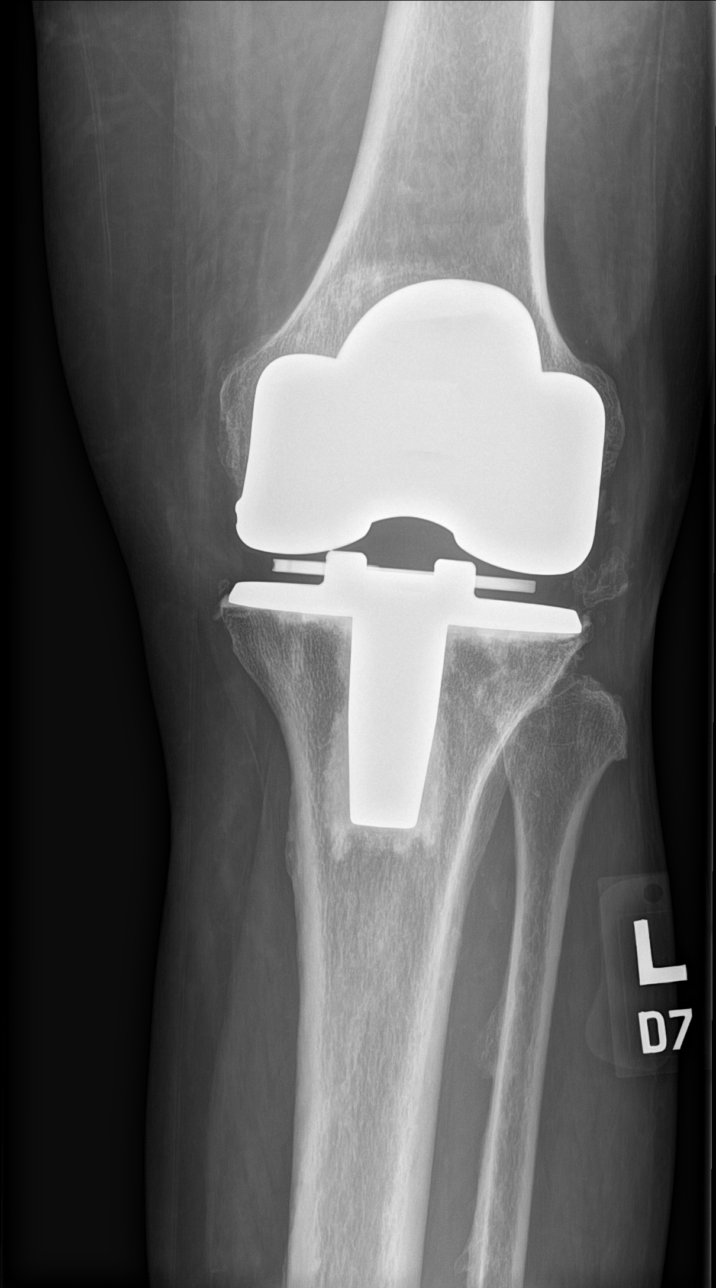

[knee obl (1 of 2)]
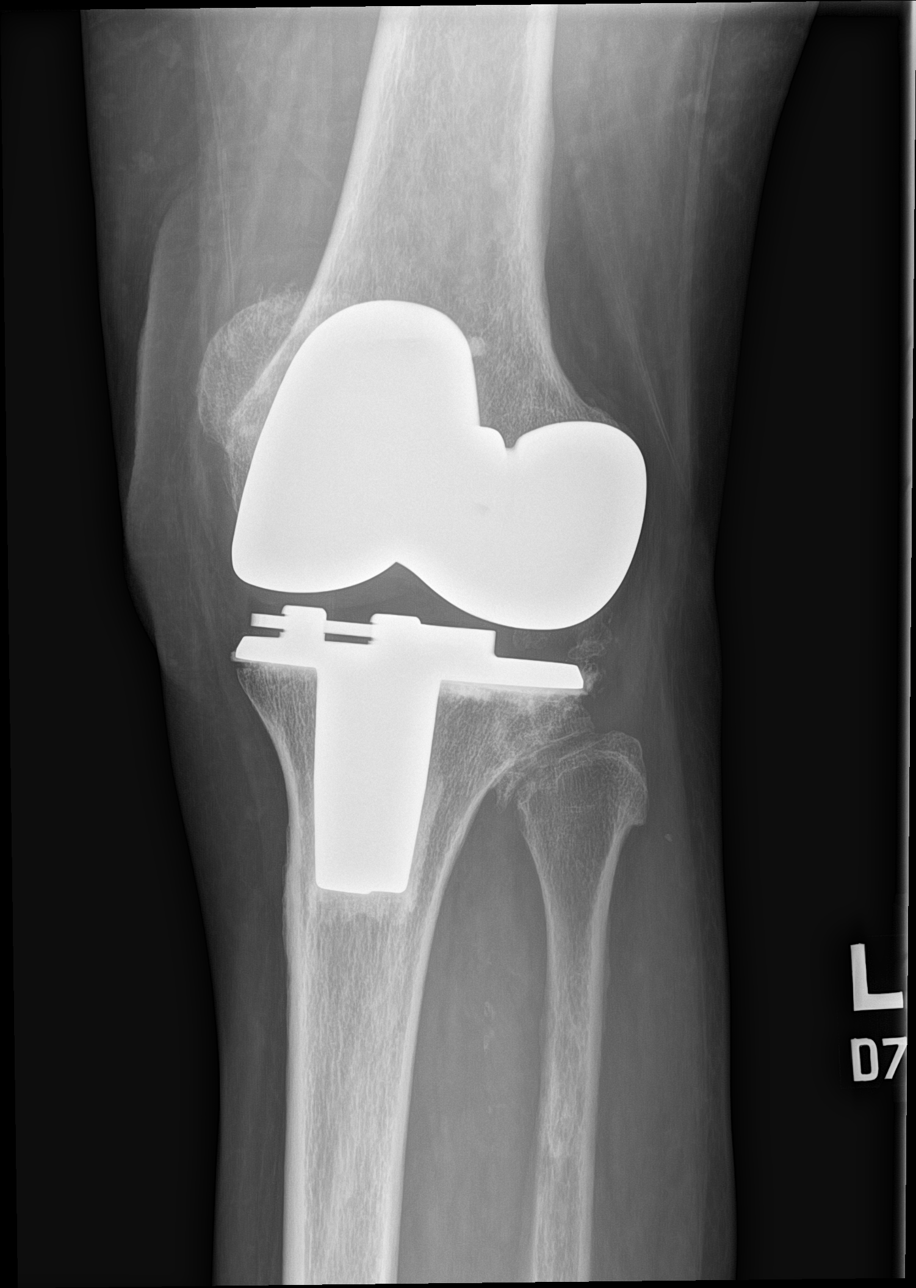

[knee obl (2 of 2)]
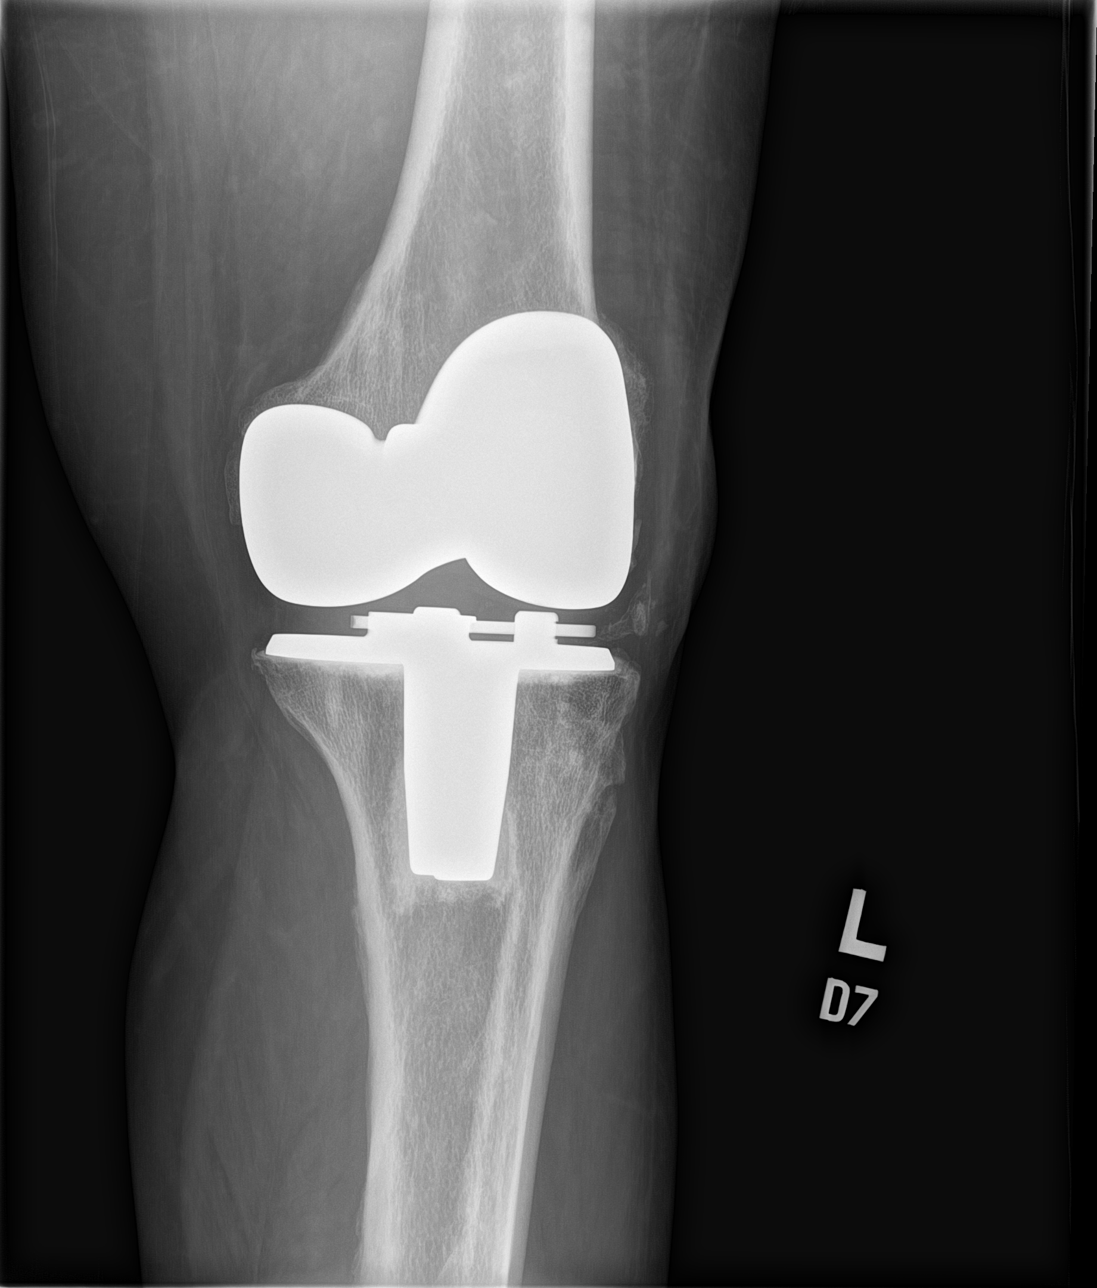

[knee lat]
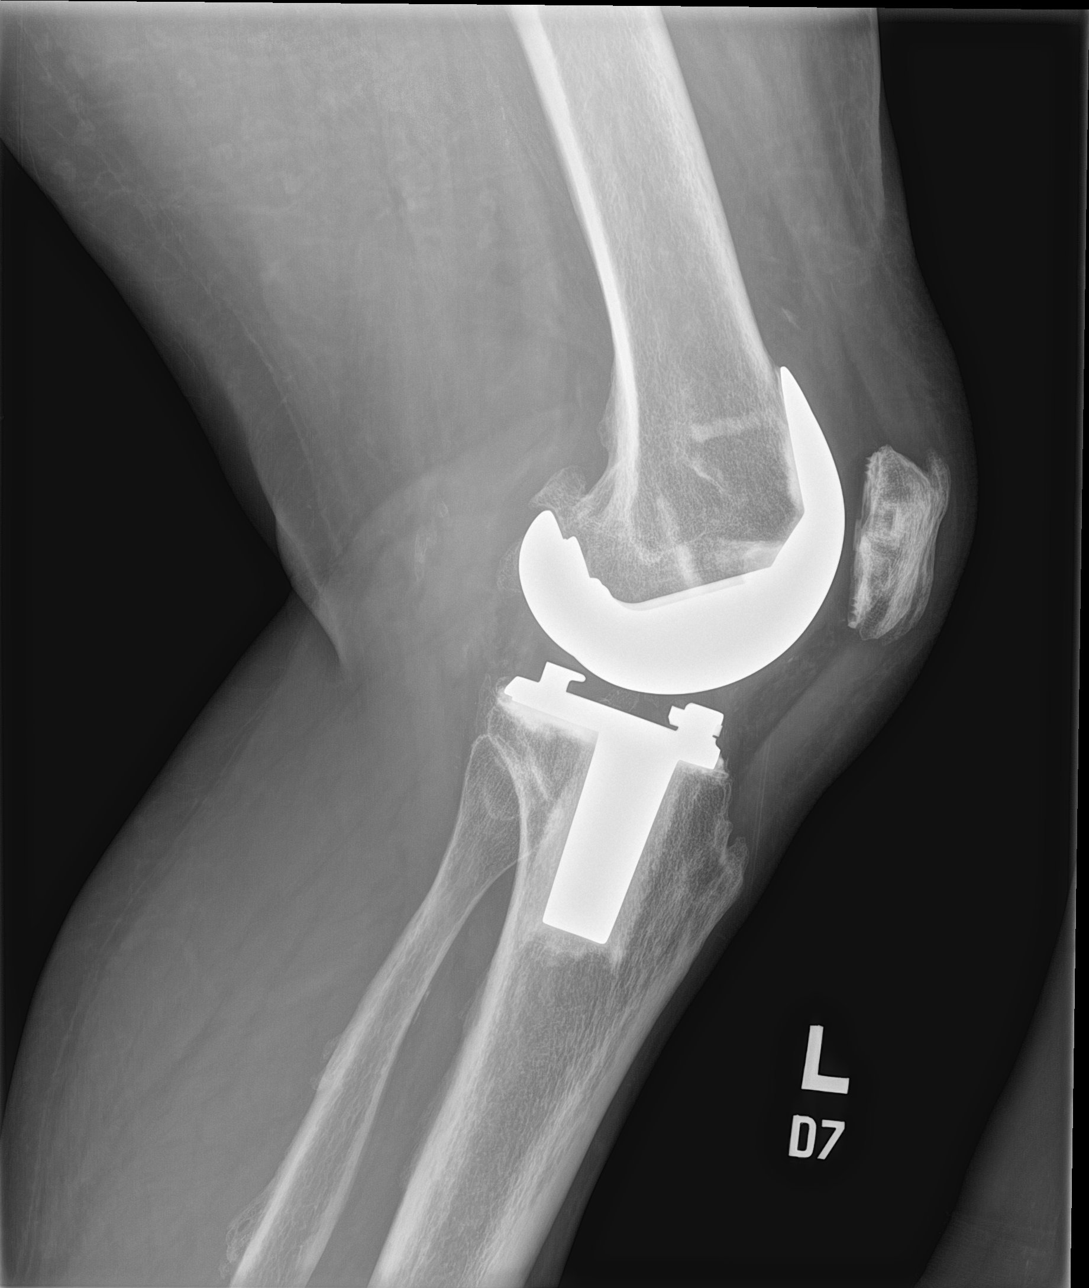

[4 of 4 positions shown; findings below may reference images not displayed]

FINDINGS: Negative for fracture.  No focal bony lesion identified.

Left knee replacement without complication. No hardware loosening.
Small knee effusion.
IMPRESSION: Left knee replacement without complicating features. Small joint
effusion

## 2023-10-01 ENCOUNTER — Other Ambulatory Visit: Payer: Self-pay | Admitting: Cardiology

## 2023-10-07 ENCOUNTER — Other Ambulatory Visit (HOSPITAL_COMMUNITY)
Admission: RE | Admit: 2023-10-07 | Discharge: 2023-10-07 | Disposition: A | Discharge status: Home / Self Care | Payer: Medicare PPO | Source: Ambulatory Visit | Attending: Nephrology | Admitting: Nephrology

## 2023-10-07 DIAGNOSIS — N189 Chronic kidney disease, unspecified: Secondary | ICD-10-CM | POA: Diagnosis not present

## 2023-10-07 LAB — RENAL FUNCTION PANEL
Albumin: 4 g/dL (ref 3.5–5.0)
Anion gap: 11 (ref 5–15)
BUN: 32 mg/dL — ABNORMAL HIGH (ref 8–23)
CO2: 24 mmol/L (ref 22–32)
Calcium: 9.3 mg/dL (ref 8.9–10.3)
Chloride: 101 mmol/L (ref 98–111)
Creatinine, Ser: 2 mg/dL — ABNORMAL HIGH (ref 0.44–1.00)
GFR, Estimated: 25 mL/min — ABNORMAL LOW (ref >60)
Glucose, Bld: 109 mg/dL — ABNORMAL HIGH (ref 70–99)
Phosphorus: 3 mg/dL (ref 2.5–4.6)
Potassium: 3.6 mmol/L (ref 3.5–5.1)
Sodium: 136 mmol/L (ref 135–145)

## 2023-10-07 LAB — CBC
HCT: 36.1 % (ref 36.0–46.0)
Hemoglobin: 11.3 g/dL — ABNORMAL LOW (ref 12.0–15.0)
MCH: 27.9 pg (ref 26.0–34.0)
MCHC: 31.3 g/dL (ref 30.0–36.0)
MCV: 89.1 fL (ref 80.0–100.0)
Platelets: 238 10*3/uL (ref 150–400)
RBC: 4.05 MIL/uL (ref 3.87–5.11)
RDW: 15.4 % (ref 11.5–15.5)
WBC: 9.5 10*3/uL (ref 4.0–10.5)
nRBC: 0 % (ref 0.0–0.2)

## 2023-10-07 LAB — PROTEIN / CREATININE RATIO, URINE
Creatinine, Urine: 92 mg/dL
Protein Creatinine Ratio: 0.14 mg/mg{creat} (ref 0.00–0.15)
Total Protein, Urine: 13 mg/dL

## 2023-10-08 LAB — PARATHYROID HORMONE, INTACT (NO CA): PTH: 34 pg/mL (ref 15–65)

## 2023-10-11 ENCOUNTER — Ambulatory Visit (INDEPENDENT_AMBULATORY_CARE_PROVIDER_SITE_OTHER): Payer: Medicare PPO

## 2023-10-11 VITALS — Ht 64.0 in | Wt 146.0 lb

## 2023-10-11 DIAGNOSIS — Z0000 Encounter for general adult medical examination without abnormal findings: Secondary | ICD-10-CM | POA: Diagnosis not present

## 2023-10-11 NOTE — Progress Notes (Signed)
Subjective:   Deborah Gomez is a 77 y.o. female who presents for Medicare Annual (Subsequent) preventive examination.  Visit Complete: Virtual I connected with  Jefferson Fuel on 10/11/23 by a audio enabled telemedicine application and verified that I am speaking with the correct person using two identifiers.  Patient Location: Home  Provider Location: Home Office  I discussed the limitations of evaluation and management by telemedicine. The patient expressed understanding and agreed to proceed.  Vital Signs: Because this visit was a virtual/telehealth visit, some criteria may be missing or patient reported. Any vitals not documented were not able to be obtained and vitals that have been documented are patient reported.  Cardiac Risk Factors include: advanced age (>63men, >7 women);smoking/ tobacco exposure;dyslipidemia;hypertension     Objective:    Today's Vitals   10/11/23 1833  Weight: 146 lb (66.2 kg)  Height: 5\' 4"  (1.626 m)   Body mass index is 25.06 kg/m.     10/11/2023    6:38 PM 02/24/2023    2:44 PM 01/15/2023    2:00 PM 08/27/2022   11:02 AM 08/22/2022   10:39 AM 08/21/2022    9:08 AM 07/30/2022    8:37 AM  Advanced Directives  Does Patient Have a Medical Advance Directive? No No No No No No No  Would patient like information on creating a medical advance directive? Yes (MAU/Ambulatory/Procedural Areas - Information given) No - Patient declined No - Patient declined No - Patient declined No - Patient declined No - Patient declined No - Patient declined    Current Medications (verified) Outpatient Encounter Medications as of 10/11/2023  Medication Sig   aspirin EC 81 MG tablet Take 81 mg by mouth daily. Swallow whole.   calcitRIOL (ROCALTROL) 0.25 MCG capsule Take 0.25 mcg by mouth daily.   epoetin alfa (EPOGEN) 3000 UNIT/ML injection Inject 3,000 Units into the skin every 14 (fourteen) days. Patient has not started medication as of 02/06/2023.   furosemide  (LASIX) 20 MG tablet Take 1 tablet by mouth twice daily   gabapentin (NEURONTIN) 100 MG capsule TAKE 1 CAPSULE BY MOUTH THREE TIMES DAILY   hydrALAZINE (APRESOLINE) 50 MG tablet Take 100 mg by mouth 2 (two) times daily.   losartan (COZAAR) 50 MG tablet Take 50 mg by mouth daily.   NIFEdipine (PROCARDIA XL/NIFEDICAL-XL) 90 MG 24 hr tablet Take 90 mg by mouth daily.   potassium chloride SA (KLOR-CON M) 20 MEQ tablet Take 20 mEq by mouth daily.   rosuvastatin (CRESTOR) 40 MG tablet Take 1 tablet by mouth once daily   No facility-administered encounter medications on file as of 10/11/2023.    Allergies (verified) Patient has no known allergies.   History: Past Medical History:  Diagnosis Date   Aortic valve sclerosis    Chronic kidney disease    CKD (chronic kidney disease), stage IV (HCC)    Diabetes mellitus (HCC)    Diastolic dysfunction    Hypertension    LVH (left ventricular hypertrophy)    Past Surgical History:  Procedure Laterality Date   APPENDECTOMY     BREAST SURGERY Left    CARPAL TUNNEL RELEASE Right    CATARACT EXTRACTION W/PHACO Right 07/30/2022   Procedure: CATARACT EXTRACTION PHACO AND INTRAOCULAR LENS PLACEMENT (IOC);  Surgeon: Fabio Pierce, MD;  Location: [facility];  Service: Ophthalmology;  Laterality: Right;  CDE:19.32   CATARACT EXTRACTION W/PHACO Left 08/27/2022   Procedure: CATARACT EXTRACTION PHACO AND INTRAOCULAR LENS PLACEMENT (IOC);  Surgeon: Fabio Pierce, MD;  Location: [facility];  Service: Ophthalmology;  Laterality: Left;  CDE: 32.13   REPLACEMENT TOTAL KNEE Left    THYROID SURGERY     partial removal   Family History  Adopted: Yes  Problem Relation Age of Onset   Colon cancer Neg Hx    Colon polyps Neg Hx    Social History   Socioeconomic History   Marital status: Significant Other    Spouse name: Not on file   Number of children: 2   Years of education: Not on file   Highest education level: Not on file  Occupational History   Occupation:  Retired    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  Tobacco Use   Smoking status: Every Day    Current packs/day: 0.00    Average packs/day: 0.3 packs/day for 52.0 years (13.0 ttl pk-yrs)    Types: Cigarettes    Start date: 08/21/1969    Last attempt to quit: 08/21/2021    Years since quitting: 2.1   Smokeless tobacco: Never   Tobacco comments:    Smokes about 4 cigarettes per day   Vaping Use   Vaping status: Never Used  Substance and Sexual Activity   Alcohol use: Not Currently   Drug use: Never   Sexual activity: Yes    Birth control/protection: Post-menopausal  Other Topics Concern   Not on file  Social History Narrative   1 child in [place], the other is not nearby   Social Determinants of Health   Financial Resource Strain: Low Risk  (10/11/2023)   Overall Financial Resource Strain (CARDIA)    Difficulty of Paying Living Expenses: Not hard at all  Food Insecurity: No Food Insecurity (10/11/2023)   Hunger Vital Sign    Worried About Running Out of Food in the Last Year: Never true    Ran Out of Food in the Last Year: Never true  Transportation Needs: No Transportation Needs (10/11/2023)   PRAPARE - Administrator, Civil Service (Medical): No    Lack of Transportation (Non-Medical): No  Physical Activity: Insufficiently Active (10/11/2023)   Exercise Vital Sign    Days of Exercise per Week: 3 days    Minutes of Exercise per Session: 30 min  Stress: No Stress Concern Present (10/11/2023)   [employer] of Occupational Health - Occupational Stress Questionnaire    Feeling of Stress : Not at all  Social Connections: Socially Isolated (10/11/2023)   Social Connection and Isolation Panel [NHANES]    Frequency of Communication with Friends and Family: Three times a week    Frequency of Social Gatherings with Friends and Family: Once a week    Attends Religious Services: Never    Database administrator or Organizations: No    Attends Tax inspector Meetings: Never    Marital Status: Widowed    Tobacco Counseling Ready to quit: Not Answered Counseling given: Not Answered Tobacco comments: Smokes about 4 cigarettes per day    Clinical Intake:  Pre-visit preparation completed: Yes  Pain : No/denies pain     Diabetes: No  How often do you need to have someone help you when you read instructions, pamphlets, or other written materials from your doctor or pharmacy?: 1 - Never  Interpreter Needed?: No  Information entered by :: Kandis Fantasia LPN   Activities of Daily Living    10/11/2023    6:38 PM  In your present state of health, do you have any difficulty performing the following activities:  Hearing? 0  Vision? 0  Difficulty concentrating or making decisions? 0  Walking or climbing stairs? 0  Dressing or bathing? 0  Doing errands, shopping? 0  Preparing Food and eating ? N  Using the Toilet? N  In the past six months, have you accidently leaked urine? N  Do you have problems with loss of bowel control? N  Managing your Medications? N  Managing your Finances? N  Housekeeping or managing your Housekeeping? N    Patient Care Team: Tommie Sams, DO as PCP - General (Family Medicine) Wyline Mood Dorothe Pea, MD as PCP - Cardiology (Cardiology) Gavin Pound, [facility] (Inactive) (Pharmacist)  Indicate any recent Medical Services you may have received from other than [facility] providers in the past year (date may be approximate).     Assessment:   This is a routine wellness examination for [place].  Hearing/Vision screen Hearing Screening - Comments:: Denies hearing difficulties   Vision Screening - Comments:: No vision problems; will schedule routine eye exam soon     Goals Addressed   None   Depression Screen    10/11/2023    6:37 PM 07/30/2023   10:11 AM 04/09/2023    9:48 AM 03/05/2023   11:18 AM 01/09/2023   10:44 AM 08/22/2022   10:38 AM 01/09/2022    9:51 AM  PHQ 2/9 Scores  PHQ - 2  Score 0 0 0 0 0 0 0  PHQ- 9 Score  0 0 0 0 0     Fall Risk    10/11/2023    6:38 PM 07/30/2023   10:10 AM 04/09/2023    9:48 AM 03/05/2023   11:17 AM 08/22/2022   10:40 AM  Fall Risk   Falls in the past year? 0 1 0 1 0  Number falls in past yr: 0  0 1 0  Injury with Fall? 0 1 0 1 0  Risk for fall due to : No Fall Risks  No Fall Risks History of fall(s) No Fall Risks  Follow up Falls prevention discussed;Education provided;Falls evaluation completed  Falls evaluation completed Falls evaluation completed Falls prevention discussed;Falls evaluation completed    MEDICARE RISK AT HOME: Medicare Risk at Home Any stairs in or around the home?: No If so, are there any without handrails?: No Home free of loose throw rugs in walkways, pet beds, electrical cords, etc?: Yes Adequate lighting in your home to reduce risk of falls?: Yes Life alert?: No Use of a cane, walker or w/c?: No Grab bars in the bathroom?: Yes Shower chair or bench in shower?: No Elevated toilet seat or a handicapped toilet?: Yes  TIMED UP AND GO:  Was the test performed?  No    Cognitive Function:    06/06/2021    1:25 PM  MMSE - Mini Mental State Exam  Not completed: Unable to complete        10/11/2023    6:38 PM 06/06/2021    1:25 PM  6CIT Screen  What Year? 0 points 0 points  What month? 0 points 0 points  What time? 0 points 0 points  Count back from 20 0 points 0 points  Months in reverse 0 points 0 points  Repeat phrase 0 points 0 points  Total Score 0 points 0 points    Immunizations  There is no immunization history on file for this patient.  TDAP status: Due, Education has been provided regarding the importance of this vaccine. Advised may receive this vaccine at local  pharmacy or Health Dept. Aware to provide a copy of the vaccination record if obtained from local pharmacy or Health Dept. Verbalized acceptance and understanding.  Flu Vaccine status: Due, Education has been provided  regarding the importance of this vaccine. Advised may receive this vaccine at local pharmacy or Health Dept. Aware to provide a copy of the vaccination record if obtained from local pharmacy or Health Dept. Verbalized acceptance and understanding.  Pneumococcal vaccine status: Declined,  Education has been provided regarding the importance of this vaccine but patient still declined. Advised may receive this vaccine at local pharmacy or Health Dept. Aware to provide a copy of the vaccination record if obtained from local pharmacy or Health Dept. Verbalized acceptance and understanding.   Covid-19 vaccine status: Declined, Education has been provided regarding the importance of this vaccine but patient still declined. Advised may receive this vaccine at local pharmacy or Health Dept.or vaccine clinic. Aware to provide a copy of the vaccination record if obtained from local pharmacy or Health Dept. Verbalized acceptance and understanding.  Qualifies for Shingles Vaccine? Yes   Zostavax completed No   Shingrix Completed?: No.    Education has been provided regarding the importance of this vaccine. Patient has been advised to call insurance company to determine out of pocket expense if they have not yet received this vaccine. Advised may also receive vaccine at local pharmacy or Health Dept. Verbalized acceptance and understanding.  Screening Tests Health Maintenance  Topic Date Due   DTaP/Tdap/Td (1 - Tdap) Never done   Zoster Vaccines- Shingrix (1 of 2) 10/29/2023 (Originally 12/10/1995)   INFLUENZA VACCINE  02/17/2024 (Originally 06/20/2023)   Pneumonia Vaccine 23+ Years old (1 of 2 - PCV) 07/29/2024 (Originally 12/10/1951)   Diabetic kidney evaluation - eGFR measurement  10/06/2024   Diabetic kidney evaluation - Urine ACR  10/06/2024   Medicare Annual Wellness (AWV)  10/10/2024   DEXA SCAN  Completed   HPV VACCINES  Aged Out   COVID-19 Vaccine  Discontinued   Hepatitis C Screening  Discontinued     Health Maintenance  Health Maintenance Due  Topic Date Due   DTaP/Tdap/Td (1 - Tdap) Never done    Colorectal cancer screening: No longer required.   Mammogram status: No longer required due to age and preference.  Bone Density status: Completed 08/23/21. Results reflect: Bone density results: NORMAL. Repeat every 5 years.  Lung Cancer Screening: (Low Dose CT Chest recommended if Age 50-80 years, 20 pack-year currently smoking OR have quit w/in 15years.) does not qualify.   Lung Cancer Screening Referral: n/a  Additional Screening:  Hepatitis C Screening: does qualify  Vision Screening: Recommended annual ophthalmology exams for early detection of glaucoma and other disorders of the eye. Is the patient up to date with their annual eye exam?  No  Who is the provider or what is the name of the office in which the patient attends annual eye exams? none If pt is not established with a provider, would they like to be referred to a provider to establish care? No .   Dental Screening: Recommended annual dental exams for proper oral hygiene  Community Resource Referral / Chronic Care Management: CRR required this visit?  No   CCM required this visit?  No     Plan:     I have personally reviewed and noted the following in the patient's chart:   Medical and social history Use of alcohol, tobacco or illicit drugs  Current medications and supplements including opioid  prescriptions. Patient is not currently taking opioid prescriptions. Functional ability and status Nutritional status Physical activity Advanced directives List of other physicians Hospitalizations, surgeries, and ER visits in previous 12 months Vitals Screenings to include cognitive, depression, and falls Referrals and appointments  In addition, I have reviewed and discussed with patient certain preventive protocols, quality metrics, and best practice recommendations. A written personalized care plan for  preventive services as well as general preventive health recommendations were provided to patient.     Kandis Fantasia [place], [place]   78/29/5621   After Visit Summary: (Mail) Due to this being a telephonic visit, the after visit summary with patients personalized plan was offered to patient via mail   Nurse Notes: No concerns at this time

## 2023-10-11 NOTE — Patient Instructions (Signed)
Ms. Chrysler , Thank you for taking time to come for your Medicare Wellness Visit. I appreciate your ongoing commitment to your health goals. Please review the following plan we discussed and let me know if I can assist you in the future.   Referrals/Orders/Follow-Ups/Clinician Recommendations: Aim for 30 minutes of exercise or brisk walking, 6-8 glasses of water, and 5 servings of fruits and vegetables each day.  This is a list of the screening recommended for you and due dates:  Health Maintenance  Topic Date Due   DTaP/Tdap/Td vaccine (1 - Tdap) Never done   Zoster (Shingles) Vaccine (1 of 2) 10/29/2023*   Flu Shot  02/17/2024*   Pneumonia Vaccine (1 of 2 - PCV) 07/29/2024*   Yearly kidney function blood test for diabetes  10/06/2024   Yearly kidney health urinalysis for diabetes  10/06/2024   Medicare Annual Wellness Visit  10/10/2024   DEXA scan (bone density measurement)  Completed   HPV Vaccine  Aged Out   COVID-19 Vaccine  Discontinued   Hepatitis C Screening  Discontinued  *Topic was postponed. The date shown is not the original due date.    Advanced directives: (ACP Link)Information on Advanced Care Planning can be found at [facility] Advance Health Care Directives Advance Health Care Directives ([email or web address]   Next Medicare Annual Wellness Visit scheduled for next year: Yes

## 2023-10-14 DIAGNOSIS — E1122 Type 2 diabetes mellitus with diabetic chronic kidney disease: Secondary | ICD-10-CM | POA: Diagnosis not present

## 2023-10-14 DIAGNOSIS — I129 Hypertensive chronic kidney disease with stage 1 through stage 4 chronic kidney disease, or unspecified chronic kidney disease: Secondary | ICD-10-CM | POA: Diagnosis not present

## 2023-10-14 DIAGNOSIS — E1129 Type 2 diabetes mellitus with other diabetic kidney complication: Secondary | ICD-10-CM | POA: Diagnosis not present

## 2023-10-14 DIAGNOSIS — D631 Anemia in chronic kidney disease: Secondary | ICD-10-CM | POA: Diagnosis not present

## 2023-10-20 ENCOUNTER — Other Ambulatory Visit: Payer: Self-pay | Admitting: Family Medicine

## 2023-10-20 DIAGNOSIS — E1122 Type 2 diabetes mellitus with diabetic chronic kidney disease: Secondary | ICD-10-CM

## 2023-11-14 ENCOUNTER — Other Ambulatory Visit: Payer: Self-pay | Admitting: Family Medicine

## 2023-11-14 ENCOUNTER — Other Ambulatory Visit: Payer: Self-pay | Admitting: Cardiology

## 2023-11-14 DIAGNOSIS — E785 Hyperlipidemia, unspecified: Secondary | ICD-10-CM

## 2023-11-25 ENCOUNTER — Other Ambulatory Visit: Payer: Self-pay | Admitting: Family Medicine

## 2023-11-25 DIAGNOSIS — E1122 Type 2 diabetes mellitus with diabetic chronic kidney disease: Secondary | ICD-10-CM

## 2023-11-26 ENCOUNTER — Other Ambulatory Visit: Payer: Self-pay | Admitting: Cardiology

## 2023-12-06 ENCOUNTER — Telehealth: Payer: Self-pay | Admitting: Cardiology

## 2023-12-06 ENCOUNTER — Other Ambulatory Visit: Payer: Self-pay | Admitting: Cardiology

## 2023-12-06 MED ORDER — HYDRALAZINE HCL 50 MG PO TABS: 100.0000 mg | ORAL_TABLET | Freq: Two times a day (BID) | ORAL | 2 refills | Status: DC

## 2023-12-06 NOTE — Telephone Encounter (Signed)
Due to different encounter notes on this medication, not sure hoe the patient is taking. Called patient, no vm setup

## 2023-12-06 NOTE — Telephone Encounter (Signed)
*  STAT* If patient is at the pharmacy, call can be transferred to refill team.   1. Which medications need to be refilled? (please list name of each medication and dose if known)   hydrALAZINE (APRESOLINE) 50 MG tablet   2. Would you like to learn more about the convenience, safety, & potential cost savings by using the [facility]?   3. Are you open to using the [facility] (Type [facility]. ).  4. Which pharmacy/location (including street and city if local pharmacy) is medication to be sent to?  [facility] [address] [place] - [address]   5. Do they need a 30 day or 90 day supply?   90 day  Patient stated she has 3 days left of this medication.

## 2023-12-06 NOTE — Telephone Encounter (Signed)
Pt's medication was sent to pt's pharmacy as requested. Confirmation received.  °

## 2023-12-30 ENCOUNTER — Other Ambulatory Visit: Payer: Self-pay | Admitting: Family Medicine

## 2023-12-30 DIAGNOSIS — E1122 Type 2 diabetes mellitus with diabetic chronic kidney disease: Secondary | ICD-10-CM

## 2024-01-14 ENCOUNTER — Other Ambulatory Visit (HOSPITAL_COMMUNITY)
Admission: RE | Admit: 2024-01-14 | Discharge: 2024-01-14 | Disposition: A | Discharge status: Home / Self Care | Payer: Medicare PPO | Source: Ambulatory Visit | Attending: Nephrology | Admitting: Nephrology

## 2024-01-14 DIAGNOSIS — N189 Chronic kidney disease, unspecified: Secondary | ICD-10-CM | POA: Diagnosis not present

## 2024-01-14 LAB — CBC
HCT: 32.2 % — ABNORMAL LOW (ref 36.0–46.0)
Hemoglobin: 9.8 g/dL — ABNORMAL LOW (ref 12.0–15.0)
MCH: 26.7 pg (ref 26.0–34.0)
MCHC: 30.4 g/dL (ref 30.0–36.0)
MCV: 87.7 fL (ref 80.0–100.0)
Platelets: 399 10*3/uL (ref 150–400)
RBC: 3.67 MIL/uL — ABNORMAL LOW (ref 3.87–5.11)
RDW: 14.4 % (ref 11.5–15.5)
WBC: 7.9 10*3/uL (ref 4.0–10.5)
nRBC: 0 % (ref 0.0–0.2)

## 2024-01-14 LAB — RENAL FUNCTION PANEL
Albumin: 3.1 g/dL — ABNORMAL LOW (ref 3.5–5.0)
Anion gap: 10 (ref 5–15)
BUN: 19 mg/dL (ref 8–23)
CO2: 23 mmol/L (ref 22–32)
Calcium: 8.9 mg/dL (ref 8.9–10.3)
Chloride: 105 mmol/L (ref 98–111)
Creatinine, Ser: 1.57 mg/dL — ABNORMAL HIGH (ref 0.44–1.00)
GFR, Estimated: 34 mL/min — ABNORMAL LOW (ref >60)
Glucose, Bld: 111 mg/dL — ABNORMAL HIGH (ref 70–99)
Phosphorus: 2.8 mg/dL (ref 2.5–4.6)
Potassium: 3.8 mmol/L (ref 3.5–5.1)
Sodium: 138 mmol/L (ref 135–145)

## 2024-01-14 LAB — PROTEIN / CREATININE RATIO, URINE
Creatinine, Urine: 66 mg/dL
Total Protein, Urine: <6 mg/dL

## 2024-01-15 LAB — PARATHYROID HORMONE, INTACT (NO CA): PTH: 31 pg/mL (ref 15–65)

## 2024-01-21 DIAGNOSIS — I129 Hypertensive chronic kidney disease with stage 1 through stage 4 chronic kidney disease, or unspecified chronic kidney disease: Secondary | ICD-10-CM | POA: Diagnosis not present

## 2024-01-21 DIAGNOSIS — N1832 Chronic kidney disease, stage 3b: Secondary | ICD-10-CM | POA: Diagnosis not present

## 2024-01-21 DIAGNOSIS — E1122 Type 2 diabetes mellitus with diabetic chronic kidney disease: Secondary | ICD-10-CM | POA: Diagnosis not present

## 2024-01-21 DIAGNOSIS — D631 Anemia in chronic kidney disease: Secondary | ICD-10-CM | POA: Diagnosis not present

## 2024-01-27 ENCOUNTER — Ambulatory Visit (INDEPENDENT_AMBULATORY_CARE_PROVIDER_SITE_OTHER): Payer: Medicare PPO | Admitting: Family Medicine

## 2024-01-27 ENCOUNTER — Ambulatory Visit (HOSPITAL_COMMUNITY)
Admission: RE | Admit: 2024-01-27 | Discharge: 2024-01-27 | Disposition: A | Discharge status: Home / Self Care | Source: Ambulatory Visit | Attending: Family Medicine | Admitting: Family Medicine

## 2024-01-27 ENCOUNTER — Encounter: Payer: Self-pay | Admitting: Family Medicine

## 2024-01-27 VITALS — BP 140/71 | HR 102 | Temp 97.2°F | Ht 64.0 in | Wt 143.0 lb

## 2024-01-27 DIAGNOSIS — N184 Chronic kidney disease, stage 4 (severe): Secondary | ICD-10-CM | POA: Diagnosis not present

## 2024-01-27 DIAGNOSIS — R052 Subacute cough: Secondary | ICD-10-CM

## 2024-01-27 DIAGNOSIS — G629 Polyneuropathy, unspecified: Secondary | ICD-10-CM | POA: Diagnosis not present

## 2024-01-27 DIAGNOSIS — R61 Generalized hyperhidrosis: Secondary | ICD-10-CM | POA: Diagnosis not present

## 2024-01-27 DIAGNOSIS — E1122 Type 2 diabetes mellitus with diabetic chronic kidney disease: Secondary | ICD-10-CM

## 2024-01-27 DIAGNOSIS — R059 Cough, unspecified: Secondary | ICD-10-CM | POA: Diagnosis not present

## 2024-01-27 DIAGNOSIS — R9389 Abnormal findings on diagnostic imaging of other specified body structures: Secondary | ICD-10-CM | POA: Diagnosis not present

## 2024-01-27 DIAGNOSIS — J9811 Atelectasis: Secondary | ICD-10-CM | POA: Diagnosis not present

## 2024-01-27 DIAGNOSIS — I10 Essential (primary) hypertension: Secondary | ICD-10-CM | POA: Diagnosis not present

## 2024-01-27 MED ORDER — GABAPENTIN 100 MG PO CAPS: 200.0000 mg | ORAL_CAPSULE | Freq: Three times a day (TID) | ORAL | 5 refills | Status: DC

## 2024-01-27 NOTE — Patient Instructions (Addendum)
 I increased the Gabapentin to 200 mg 3 times daily.   Chest xray today.  Follow up in 1 month.

## 2024-01-28 ENCOUNTER — Other Ambulatory Visit: Payer: Self-pay

## 2024-01-28 DIAGNOSIS — R61 Generalized hyperhidrosis: Secondary | ICD-10-CM

## 2024-01-28 DIAGNOSIS — R634 Abnormal weight loss: Secondary | ICD-10-CM

## 2024-01-28 DIAGNOSIS — Z1211 Encounter for screening for malignant neoplasm of colon: Secondary | ICD-10-CM

## 2024-01-28 DIAGNOSIS — G629 Polyneuropathy, unspecified: Secondary | ICD-10-CM | POA: Insufficient documentation

## 2024-01-28 DIAGNOSIS — R052 Subacute cough: Secondary | ICD-10-CM | POA: Insufficient documentation

## 2024-01-28 NOTE — Assessment & Plan Note (Signed)
 Fair control given age.  Continue current medications.

## 2024-01-28 NOTE — Progress Notes (Signed)
 Subjective:  Patient ID: Deborah Gomez, female    DOB: September 23, 1946  Age: 78 y.o. MRN: [id number]  CC:   Chief Complaint  Patient presents with   Hypertension    6 month f/u hypertension   Cough    Productive cough, light yellow mucus, fatigue x's 2-3 weeks    HPI:  78 year old female with the below mentioned medical problems presents for follow-up.  Hypertension fairly well-controlled on Lasix, hydralazine, nifedipine, and losartan.  Follows with nephrology.  Patient reports that she has had ongoing productive cough and associated fatigue.  Now seems to finally be improving.  Patient reports that she is having ongoing discomfort/pain associated with neuropathy.  Currently on gabapentin.  Will discuss increase today.  Additionally, towards the end of the visit, patient endorses that she has had recent night sweats.  She states that her appetite has increased but her weight has been trending down.  Patient Active Problem List   Diagnosis Date Noted   Subacute cough 01/28/2024   Neuropathy 01/28/2024   Cigarette smoker 02/06/2023   Bronchiectasis without complication (HCC) 02/06/2023   Aortic valve sclerosis 04/11/2022   Diastolic dysfunction 04/11/2022   CKD (chronic kidney disease) stage 4, GFR 15-29 ml/min (HCC) 01/09/2022   Anemia of chronic disease 01/09/2022   Goiter 01/09/2022   Positive colorectal cancer screening using Cologuard test 12/19/2021   HLD (hyperlipidemia) 08/16/2021   Vitamin D deficiency 06/06/2021   Hypertension 05/15/2021    Social Hx   Social History   Socioeconomic History   Marital status: Significant Other    Spouse name: Not on file   Number of children: 2   Years of education: Not on file   Highest education level: Not on file  Occupational History   Occupation: Retired    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  Tobacco Use   Smoking status: Every Day    Current packs/day: 0.00    Average packs/day: 0.3 packs/day for  52.0 years (13.0 ttl pk-yrs)    Types: Cigarettes    Start date: 08/21/1969    Last attempt to quit: 08/21/2021    Years since quitting: 2.4   Smokeless tobacco: Never   Tobacco comments:    Smokes about 4 cigarettes per day   Vaping Use   Vaping status: Never Used  Substance and Sexual Activity   Alcohol use: Not Currently   Drug use: Never   Sexual activity: Yes    Birth control/protection: Post-menopausal  Other Topics Concern   Not on file  Social History Narrative   1 child in [place], the other is not nearby   Social Drivers of Corporate investment banker Strain: Low Risk  (10/11/2023)   Overall Financial Resource Strain (CARDIA)    Difficulty of Paying Living Expenses: Not hard at all  Food Insecurity: No Food Insecurity (10/11/2023)   Hunger Vital Sign    Worried About Running Out of Food in the Last Year: Never true    Ran Out of Food in the Last Year: Never true  Transportation Needs: No Transportation Needs (10/11/2023)   PRAPARE - Administrator, Civil Service (Medical): No    Lack of Transportation (Non-Medical): No  Physical Activity: Insufficiently Active (10/11/2023)   Exercise Vital Sign    Days of Exercise per Week: 3 days    Minutes of Exercise per Session: 30 min  Stress: No Stress Concern Present (10/11/2023)   [employer] of Occupational Health - Occupational Stress Questionnaire  Feeling of Stress : Not at all  Social Connections: Socially Isolated (10/11/2023)   Social Connection and Isolation Panel [NHANES]    Frequency of Communication with Friends and Family: Three times a week    Frequency of Social Gatherings with Friends and Family: Once a week    Attends Religious Services: Never    Database administrator or Organizations: No    Attends Banker Meetings: Never    Marital Status: Widowed    Review of Systems Per HPI  Objective:  BP (!) 140/71   Pulse (!) 102   Temp (!) 97.2 F (36.2 C)   Ht 5\' 4"   (1.626 m)   Wt 143 lb (64.9 kg)   SpO2 100%   BMI 24.55 kg/m      01/27/2024   10:04 AM 10/11/2023    6:33 PM 08/01/2023    1:49 PM  BP/Weight  Systolic BP 140 -- 140  Diastolic BP 71 -- 70  Wt. (Lbs) 143 146   BMI 24.55 kg/m2 25.06 kg/m2     Physical Exam Vitals and nursing note reviewed.  Constitutional:      General: She is not in acute distress. HENT:     Head: Normocephalic and atraumatic.  Eyes:     General:        Right eye: No discharge.        Left eye: No discharge.     Conjunctiva/sclera: Conjunctivae normal.  Cardiovascular:     Rate and Rhythm: Normal rate and regular rhythm.     Heart sounds: Murmur heard.  Pulmonary:     Effort: Pulmonary effort is normal.     Breath sounds: Normal breath sounds. No wheezing, rhonchi or rales.  Neurological:     Mental Status: She is alert.  Psychiatric:        Mood and Affect: Mood normal.        Behavior: Behavior normal.     Lab Results  Component Value Date   WBC 7.9 01/14/2024   HGB 9.8 (L) 01/14/2024   HCT 32.2 (L) 01/14/2024   PLT 399 01/14/2024   GLUCOSE 111 (H) 01/14/2024   CHOL 98 01/09/2023   TRIG 96 01/09/2023   HDL 44 01/09/2023   LDLCALC 35 01/09/2023   ALT 34 01/15/2023   AST 50 (H) 01/15/2023   NA 138 01/14/2024   K 3.8 01/14/2024   CL 105 01/14/2024   CREATININE 1.57 (H) 01/14/2024   BUN 19 01/14/2024   CO2 23 01/14/2024   TSH 0.689 12/25/2022   HGBA1C 5.9 (H) 01/09/2023   MICROALBUR 236.0 (H) 01/09/2023     Assessment & Plan:  Neuropathy Assessment & Plan: Uncontrolled.  Increasing gabapentin.   Type 2 diabetes mellitus with stage 4 chronic kidney disease, without long-term current use of insulin (HCC) -     Gabapentin; Take 2 capsules (200 mg total) by mouth 3 (three) times daily.  Dispense: 180 capsule; Refill: 5  Subacute cough Assessment & Plan: Chest x-ray was obtained today and was negative for acute findings.  Her night sweats are concerning especially in the setting  of positive Cologuard test previously.  She is also a smoker.  I am concerned about malignancy.  We will reach out to the patient with results and recommendations.  Orders: -     DG Chest 2 View  Hypertension, unspecified type Assessment & Plan: Fair control given age.  Continue current medications.     Follow-up:  Return in about  6 months (around 07/29/2024) for Follow up Chronic medical issues.  Everlene Other DO [facility]

## 2024-01-28 NOTE — Assessment & Plan Note (Signed)
 Chest x-ray was obtained today and was negative for acute findings.  Her night sweats are concerning especially in the setting of positive Cologuard test previously.  She is also a smoker.  I am concerned about malignancy.  We will reach out to the patient with results and recommendations.

## 2024-01-28 NOTE — Assessment & Plan Note (Signed)
 Uncontrolled.  Increasing gabapentin.

## 2024-01-30 ENCOUNTER — Encounter (INDEPENDENT_AMBULATORY_CARE_PROVIDER_SITE_OTHER): Payer: Self-pay | Admitting: *Deleted

## 2024-02-06 ENCOUNTER — Other Ambulatory Visit: Payer: Self-pay | Admitting: Cardiology

## 2024-02-10 ENCOUNTER — Ambulatory Visit: Payer: Medicare PPO | Admitting: Cardiology

## 2024-02-10 NOTE — Progress Notes (Deleted)
 Clinical Summary Ms. Corso is a 78 y.o.female   seen today for follow up of the following medical problems.    1.Chronic HFpEF  - 07/2021 echo: LVEF 65-70%, no WMAs, severe asymmetric LVH(on my measurement septum 1.5 cm and PW 1.3 cm), grade I DD. Normal RV, mild MR - infrequent edema.. No SOB/DOE   12/2022 echo: LVEF 65-70%, no WMAs, grade I dd, normal RV function   - no SOB/DOE. Some LE edema that is variable, overeall mild. Compliant with lasix.    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 Aortic sclerosis - mild to mod sclerosis, no significant stenosis. Mean grad 6 mmHg.  12/2022 echo: aortic sclerosis without stenosis     4. HTN - bp with pcp this week 112/54 - compliant with meds   5. Hyperlipidemia - 10/2021 TC 177 TG 81 HDL 92 LDL 70 - 12/2022 TC 98 TG 96 HDL 44 LDL 35 - she is on crestor 40mg  daily.    6.Carotid bruit - 07/2023 Carotid US: minimal bilateral plaque Past Medical History:  Diagnosis Date   Aortic valve sclerosis    Chronic kidney disease    CKD (chronic kidney disease), stage IV (HCC)    Diabetes mellitus (HCC)    Diastolic dysfunction    Hypertension    LVH (left ventricular hypertrophy)      No Known Allergies   Current Outpatient Medications  Medication Sig Dispense Refill   aspirin EC 81 MG tablet Take 81 mg by mouth daily. Swallow whole.     calcitRIOL (ROCALTROL) 0.25 MCG capsule Take 0.25 mcg by mouth daily.     epoetin alfa (EPOGEN) 3000 UNIT/ML injection Inject 3,000 Units into the skin every 14 (fourteen) days. Patient has not started medication as of 02/06/2023.     furosemide (LASIX) 20 MG tablet Take 1 tablet by mouth twice daily 60 tablet 3   gabapentin (NEURONTIN) 100 MG capsule Take 2 capsules (200 mg total) by mouth 3 (three) times daily. 180 capsule 5   hydrALAZINE (APRESOLINE) 50 MG tablet Take 2 tablets (100 mg total) by mouth 2 (two) times daily. 360 tablet 2   losartan (COZAAR) 50 MG tablet Take 50 mg by mouth  daily.     NIFEdipine (PROCARDIA XL/NIFEDICAL-XL) 90 MG 24 hr tablet Take 90 mg by mouth daily.     potassium chloride SA (KLOR-CON M) 20 MEQ tablet Take 20 mEq by mouth daily.     rosuvastatin (CRESTOR) 40 MG tablet Take 1 tablet by mouth once daily 90 tablet 0   No current facility-administered medications for this visit.     Past Surgical History:  Procedure Laterality Date   APPENDECTOMY     BREAST SURGERY Left    CARPAL TUNNEL RELEASE Right    CATARACT EXTRACTION W/PHACO Right 07/30/2022   Procedure: CATARACT EXTRACTION PHACO AND INTRAOCULAR LENS PLACEMENT (IOC);  Surgeon: Fabio Pierce, MD;  Location: [facility];  Service: Ophthalmology;  Laterality: Right;  CDE:19.32   CATARACT EXTRACTION W/PHACO Left 08/27/2022   Procedure: CATARACT EXTRACTION PHACO AND INTRAOCULAR LENS PLACEMENT (IOC);  Surgeon: Fabio Pierce, MD;  Location: [facility];  Service: Ophthalmology;  Laterality: Left;  CDE: 32.13   REPLACEMENT TOTAL KNEE Left    THYROID SURGERY     partial removal     No Known Allergies    Family History  Adopted: Yes  Problem Relation Age of Onset   Colon cancer Neg Hx  Colon polyps Neg Hx      Social History Ms. Rollinson reports that she has been smoking cigarettes. She started smoking about 54 years ago. She has a 13 pack-year smoking history. She has never used smokeless tobacco. Ms. Rios reports that she does not currently use alcohol.   Review of Systems CONSTITUTIONAL: No weight loss, fever, chills, weakness or fatigue.  HEENT: Eyes: No visual loss, blurred vision, double vision or yellow sclerae.No hearing loss, sneezing, congestion, runny nose or sore throat.  SKIN: No rash or itching.  CARDIOVASCULAR:  RESPIRATORY: No shortness of breath, cough or sputum.  GASTROINTESTINAL: No anorexia, nausea, vomiting or diarrhea. No abdominal pain or blood.  GENITOURINARY: No burning on urination, no polyuria NEUROLOGICAL: No headache, dizziness, syncope, paralysis,  ataxia, numbness or tingling in the extremities. No change in bowel or bladder control.  MUSCULOSKELETAL: No muscle, back pain, joint pain or stiffness.  LYMPHATICS: No enlarged nodes. No history of splenectomy.  PSYCHIATRIC: No history of depression or anxiety.  ENDOCRINOLOGIC: No reports of sweating, cold or heat intolerance. No polyuria or polydipsia.  Marland Kitchen   Physical Examination There were no vitals filed for this visit. There were no vitals filed for this visit.  Gen: resting comfortably, no acute distress HEENT: no scleral icterus, pupils equal round and reactive, no palptable cervical adenopathy,  CV Resp: Clear to auscultation bilaterally GI: abdomen is soft, non-tender, non-distended, normal bowel sounds, no hepatosplenomegaly MSK: extremities are warm, no edema.  Skin: warm, no rash Neuro:  no focal deficits Psych: appropriate affect   Diagnostic Studies  07/2021 echo IMPRESSIONS     1. Left ventricular ejection fraction, by estimation, is 65 to 70%. The  left ventricle has normal function. The left ventricle has no regional  wall motion abnormalities. There is severe asymmetric left ventricular  hypertrophy of the septal segment. Left   ventricular diastolic parameters are consistent with Grade I diastolic  dysfunction (impaired relaxation).   2. Right ventricular systolic function is normal. The right ventricular  size is normal. There is normal pulmonary artery systolic pressure. The  estimated right ventricular systolic pressure is 21.7 mmHg.   3. A small pericardial effusion is present. The pericardial effusion is  posterior to the left ventricle.   4. The mitral valve is grossly normal. Mild mitral valve regurgitation.   5. The aortic valve is tricuspid. There is moderate calcification of the  aortic valve. Aortic valve regurgitation is not visualized. Mild to  moderate aortic valve sclerosis/calcification is present, without any  evidence of aortic stenosis.    6. The inferior vena cava is normal in size with greater than 50%  respiratory variability, suggesting right atrial pressure of 3 mmHg.    12/2022 echo IMPRESSIONS     1. Left ventricular ejection fraction, by estimation, is 65 to 70%. The  left ventricle has normal function. The left ventricle has no regional  wall motion abnormalities. There is moderate left ventricular hypertrophy.  Left ventricular diastolic  parameters are consistent with Grade I diastolic dysfunction (impaired  relaxation).   2. Right ventricular systolic function is normal. The right ventricular  size is normal. There is normal pulmonary artery systolic pressure.   3. The mitral valve is normal in structure. No evidence of mitral valve  regurgitation. No evidence of mitral stenosis.   4. The tricuspid valve is abnormal.   5. The aortic valve is tricuspid. There is mild calcification of the  aortic valve. There is mild thickening of the aortic  valve. Aortic valve  regurgitation is not visualized. No aortic stenosis is present.   6. The inferior vena cava is normal in size with greater than 50%  respiratory variability, suggesting right atrial pressure of 3 mmHg.   07/2023 carotid US IMPRESSION: Minimal amount of bilateral atherosclerotic plaque, right greater than left, not resulting in a hemodynamically significant stenosis within either internal carotid artery.    Assessment and Plan   Chronic HFpEF - volume status controlled with lasix, continue current meds   2.HLD - she is at goal, continue current meds   3. HTN - bp elevated here but well controlled at recent pcp visit - monitor bp's and submit bp log when she comes for carotid US   4. Carotid bruit - obtain carotid US     Antoine Poche, M.D., F.A.C.C.

## 2024-02-26 ENCOUNTER — Other Ambulatory Visit: Payer: Self-pay | Admitting: Family Medicine

## 2024-02-26 DIAGNOSIS — E785 Hyperlipidemia, unspecified: Secondary | ICD-10-CM

## 2024-02-27 NOTE — Progress Notes (Unsigned)
 Cardiology Office Note    Date:  02/28/2024  ID:  Deborah Gomez, DOB [birth year], MRN [id number] Cardiologist: Dina Rich, MD    History of Present Illness:    Deborah Gomez is a 78 y.o. female with past medical history of chronic HFpEF, aortic sclerosis without stenosis, Stage 4 CKD, HTN and HLD who presents to the office today for 66-month follow-up.  She was last examined by Dr. Wyline Mood in 07/2023 and did report intermittent lower extremity edema but did not have any respiratory issues. No changes were made to her cardiac medications and she was continued on ASA 81 mg daily, Lasix 20 mg twice daily, Hydralazine 100 mg twice daily, Losartan 50 mg daily, Nifedipine 90 mg daily and Crestor 40 mg daily. Carotid dopplers were recommended given a bruit on examination and the showed only minimal plaque with no hemodynamically significant stenosis.  In talking with the patient today, she reports overall doing well since her last office visit. She did keep a detailed blood pressure log in the interim and BP has overall been well-controlled with SBP typically in the 110's to 120's. Her activity is limited due to arthritis and she uses a cane for ambulation. No recent falls. She denies any recent chest pain or dyspnea on exertion. No specific palpitations, orthopnea or PND  Does have intermittent edema along her ankles but no pitting edema. She does still smoke but estimates less than 1 pack/day.  Studies Reviewed:   EKG: EKG is ordered today and demonstrates:   EKG Interpretation Date/Time:  Friday February 28 2024 13:32:55 EDT Ventricular Rate:  87 PR Interval:  144 QRS Duration:  64 QT Interval:  372 QTC Calculation: 447 R Axis:   6  Text Interpretation: Sinus rhythm Right atrial enlargement No acute changes Confirmed by Randall An ([id number]) on 02/28/2024 1:38:17 PM       Echocardiogram: 12/2022 IMPRESSIONS     1. Left ventricular ejection fraction, by estimation, is 65 to  70%. The  left ventricle has normal function. The left ventricle has no regional  wall motion abnormalities. There is moderate left ventricular hypertrophy.  Left ventricular diastolic  parameters are consistent with Grade I diastolic dysfunction (impaired  relaxation).   2. Right ventricular systolic function is normal. The right ventricular  size is normal. There is normal pulmonary artery systolic pressure.   3. The mitral valve is normal in structure. No evidence of mitral valve  regurgitation. No evidence of mitral stenosis.   4. The tricuspid valve is abnormal.   5. The aortic valve is tricuspid. There is mild calcification of the  aortic valve. There is mild thickening of the aortic valve. Aortic valve  regurgitation is not visualized. No aortic stenosis is present.   6. The inferior vena cava is normal in size with greater than 50%  respiratory variability, suggesting right atrial pressure of 3 mmHg.   Carotid Dopplers: 07/2023 IMPRESSION: Minimal amount of bilateral atherosclerotic plaque, right greater than left, not resulting in a hemodynamically significant stenosis within either internal carotid artery.   Physical Exam:   VS:  BP (!) 132/54 (BP Location: Left Arm, Cuff Size: Normal)   Pulse 90   Ht 5\' 4"  (1.626 m)   Wt 151 lb 6.4 oz (68.7 kg)   SpO2 98%   BMI 25.99 kg/m    Wt Readings from Last 3 Encounters:  02/28/24 151 lb 6.4 oz (68.7 kg)  01/27/24 143 lb (64.9 kg)  10/11/23 146 lb (66.2 kg)  GEN: Well nourished, well developed female appearing in no acute distress NECK: No JVD; Left carotid bruit.  CARDIAC: RRR, 2/6 systolic murmur along RUSB.  RESPIRATORY:  Clear to auscultation without rales, wheezing or rhonchi  ABDOMEN: Appears non-distended. No obvious abdominal masses. EXTREMITIES: No clubbing or cyanosis. No pitting edema. Distal pedal pulses are 2+ bilaterally.   Assessment and Plan:   1. Chronic HFpEF - Echocardiogram in 12/2022 showed a  preserved EF of 65 to 70% with grade 1 diastolic dysfunction and normal RV function. She denies any recent respiratory issues and appears euvolemic by examination today. Remains on Lasix 20 mg twice daily. Creatinine had improved to 1.57 when checked in 12/2023.  2. HTN - BP is at 132/54 during today's visit and has overall been well-controlled when checked at home. Continue current medical therapy with Hydralazine 100 mg twice daily, Losartan 50 mg MWF, and Nifedipine 90 mg daily.  3. HLD - Followed by her PCP. LDL was at 35 when checked in 12/2022. Continue current medical therapy with Crestor 40 mg daily.  4. Stage 4 CKD - Followed by Dr. Wolfgang Phoenix. Creatinine had actually improved to 1.57 when checked in 12/2023 (previously at 1.8 - 2.5 in 2024).   5. Carotid Bruit - Noted to have a left carotid bruit on examination and carotid dopplers in 07/2023 showed minimal plaque with no hemodynamically significant stenosis. She is already on statin therapy with Crestor 40 mg daily.   Signed, Ellsworth Lennox, PA-C

## 2024-02-28 ENCOUNTER — Ambulatory Visit: Attending: Student | Admitting: Student

## 2024-02-28 ENCOUNTER — Encounter: Payer: Self-pay | Admitting: Student

## 2024-02-28 VITALS — BP 132/54 | HR 90 | Ht 64.0 in | Wt 151.4 lb

## 2024-02-28 DIAGNOSIS — R0989 Other specified symptoms and signs involving the circulatory and respiratory systems: Secondary | ICD-10-CM | POA: Diagnosis not present

## 2024-02-28 DIAGNOSIS — I5032 Chronic diastolic (congestive) heart failure: Secondary | ICD-10-CM

## 2024-02-28 DIAGNOSIS — N184 Chronic kidney disease, stage 4 (severe): Secondary | ICD-10-CM

## 2024-02-28 DIAGNOSIS — E785 Hyperlipidemia, unspecified: Secondary | ICD-10-CM

## 2024-02-28 DIAGNOSIS — I10 Essential (primary) hypertension: Secondary | ICD-10-CM

## 2024-02-28 NOTE — Patient Instructions (Signed)

## 2024-03-02 ENCOUNTER — Encounter: Payer: Self-pay | Admitting: Family Medicine

## 2024-03-02 ENCOUNTER — Ambulatory Visit: Admitting: Family Medicine

## 2024-03-02 VITALS — BP 120/52 | HR 83 | Temp 97.5°F | Ht 64.0 in | Wt 148.0 lb

## 2024-03-02 DIAGNOSIS — N184 Chronic kidney disease, stage 4 (severe): Secondary | ICD-10-CM | POA: Diagnosis not present

## 2024-03-02 DIAGNOSIS — I10 Essential (primary) hypertension: Secondary | ICD-10-CM

## 2024-03-02 NOTE — Assessment & Plan Note (Signed)
 Stable.  Follows with nephrology

## 2024-03-02 NOTE — Progress Notes (Signed)
 Subjective:  Patient ID: Deborah Gomez, female    DOB: 1946/07/12  Age: 78 y.o. MRN: [id number]  CC: Follow-up   HPI:  78 year old medical problems presents for follow-up.  Patient states that overall she is doing well.  She has no specific complaints today.  BP is well-controlled.  She denies chest pain.  No reported shortness of breath at this time.  No abdominal pain.  Good appetite.  Patient Active Problem List   Diagnosis Date Noted   Neuropathy 01/28/2024   Cigarette smoker 02/06/2023   Bronchiectasis without complication (HCC) 02/06/2023   Aortic valve sclerosis 04/11/2022   Diastolic dysfunction 04/11/2022   CKD (chronic kidney disease) stage 4, GFR 15-29 ml/min (HCC) 01/09/2022   Anemia of chronic disease 01/09/2022   Goiter 01/09/2022   Positive colorectal cancer screening using Cologuard test 12/19/2021   HLD (hyperlipidemia) 08/16/2021   Vitamin D deficiency 06/06/2021   Hypertension 05/15/2021    Social Hx   Social History   Socioeconomic History   Marital status: Significant Other    Spouse name: Not on file   Number of children: 2   Years of education: Not on file   Highest education level: Not on file  Occupational History   Occupation: Retired    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  Tobacco Use   Smoking status: Every Day    Current packs/day: 0.00    Average packs/day: 0.3 packs/day for 52.0 years (13.0 ttl pk-yrs)    Types: Cigarettes    Start date: 08/21/1969    Last attempt to quit: 08/21/2021    Years since quitting: 2.5   Smokeless tobacco: Never   Tobacco comments:    Smokes about 4 cigarettes per day   Vaping Use   Vaping status: Never Used  Substance and Sexual Activity   Alcohol use: Not Currently   Drug use: Never   Sexual activity: Yes    Birth control/protection: Post-menopausal  Other Topics Concern   Not on file  Social History Narrative   1 child in [place], the other is not nearby   Social Drivers of  Corporate investment banker Strain: Low Risk  (10/11/2023)   Overall Financial Resource Strain (CARDIA)    Difficulty of Paying Living Expenses: Not hard at all  Food Insecurity: No Food Insecurity (10/11/2023)   Hunger Vital Sign    Worried About Running Out of Food in the Last Year: Never true    Ran Out of Food in the Last Year: Never true  Transportation Needs: No Transportation Needs (10/11/2023)   PRAPARE - Administrator, Civil Service (Medical): No    Lack of Transportation (Non-Medical): No  Physical Activity: Insufficiently Active (10/11/2023)   Exercise Vital Sign    Days of Exercise per Week: 3 days    Minutes of Exercise per Session: 30 min  Stress: No Stress Concern Present (10/11/2023)   [employer] of Occupational Health - Occupational Stress Questionnaire    Feeling of Stress : Not at all  Social Connections: Socially Isolated (10/11/2023)   Social Connection and Isolation Panel [NHANES]    Frequency of Communication with Friends and Family: Three times a week    Frequency of Social Gatherings with Friends and Family: Once a week    Attends Religious Services: Never    Database administrator or Organizations: No    Attends Banker Meetings: Never    Marital Status: Widowed    Review of Systems  Per HPI  Objective:  BP (!) 120/52   Pulse 83   Temp (!) 97.5 F (36.4 C)   Ht 5\' 4"  (1.626 m)   Wt 148 lb (67.1 kg)   SpO2 99%   BMI 25.40 kg/m      03/02/2024   10:54 AM 02/28/2024    1:23 PM 01/27/2024   10:04 AM  BP/Weight  Systolic BP 120 132 140  Diastolic BP 52 54 71  Wt. (Lbs) 148 151.4 143  BMI 25.4 kg/m2 25.99 kg/m2 24.55 kg/m2    Physical Exam Vitals and nursing note reviewed.  Constitutional:      Appearance: Normal appearance.  HENT:     Head: Normocephalic and atraumatic.  Cardiovascular:     Rate and Rhythm: Normal rate and regular rhythm.     Heart sounds: Murmur heard.  Pulmonary:     Effort: Pulmonary  effort is normal.     Breath sounds: No wheezing or rales.  Neurological:     Mental Status: She is alert.  Psychiatric:        Mood and Affect: Mood normal.        Behavior: Behavior normal.     Lab Results  Component Value Date   WBC 7.9 01/14/2024   HGB 9.8 (L) 01/14/2024   HCT 32.2 (L) 01/14/2024   PLT 399 01/14/2024   GLUCOSE 111 (H) 01/14/2024   CHOL 98 01/09/2023   TRIG 96 01/09/2023   HDL 44 01/09/2023   LDLCALC 35 01/09/2023   ALT 34 01/15/2023   AST 50 (H) 01/15/2023   NA 138 01/14/2024   K 3.8 01/14/2024   CL 105 01/14/2024   CREATININE 1.57 (H) 01/14/2024   BUN 19 01/14/2024   CO2 23 01/14/2024   TSH 0.689 12/25/2022   HGBA1C 5.9 (H) 01/09/2023   MICROALBUR 236.0 (H) 01/09/2023     Assessment & Plan:  Hypertension, unspecified type Assessment & Plan: BP stable.  Continue current medications.   CKD (chronic kidney disease) stage 4, GFR 15-29 ml/min (HCC) Assessment & Plan: Stable.  Follows with nephrology.     Follow-up:  6 months  Delvina Mizzell Debrah Fan DO [facility]

## 2024-03-02 NOTE — Patient Instructions (Signed)
You're doing well.  Follow up in 6 months.  Take care  Dr. Zavier Canela  

## 2024-03-02 NOTE — Assessment & Plan Note (Addendum)
 BP stable.  Continue current medications.

## 2024-03-18 ENCOUNTER — Ambulatory Visit (HOSPITAL_COMMUNITY)
Admission: RE | Admit: 2024-03-18 | Discharge: 2024-03-18 | Disposition: A | Discharge status: Home / Self Care | Source: Ambulatory Visit | Attending: Family Medicine | Admitting: Family Medicine

## 2024-03-18 DIAGNOSIS — R61 Generalized hyperhidrosis: Secondary | ICD-10-CM | POA: Diagnosis not present

## 2024-03-18 DIAGNOSIS — E049 Nontoxic goiter, unspecified: Secondary | ICD-10-CM | POA: Diagnosis not present

## 2024-03-18 DIAGNOSIS — D259 Leiomyoma of uterus, unspecified: Secondary | ICD-10-CM | POA: Diagnosis not present

## 2024-03-18 DIAGNOSIS — E048 Other specified nontoxic goiter: Secondary | ICD-10-CM | POA: Insufficient documentation

## 2024-03-18 DIAGNOSIS — R634 Abnormal weight loss: Secondary | ICD-10-CM | POA: Diagnosis not present

## 2024-03-18 MED ORDER — IOHEXOL 9 MG/ML PO SOLN
ORAL | Status: AC
  Filled 2024-03-18: qty 1000

## 2024-03-24 NOTE — Progress Notes (Signed)
 Tried calling patient was not able to reach her by phone number in chart.

## 2024-05-21 ENCOUNTER — Other Ambulatory Visit (HOSPITAL_COMMUNITY)
Admission: RE | Admit: 2024-05-21 | Discharge: 2024-05-21 | Disposition: A | Discharge status: Home / Self Care | Source: Ambulatory Visit | Attending: Nephrology | Admitting: Nephrology

## 2024-05-21 DIAGNOSIS — D649 Anemia, unspecified: Secondary | ICD-10-CM | POA: Diagnosis not present

## 2024-05-21 LAB — CBC
HCT: 34.5 % — ABNORMAL LOW (ref 36.0–46.0)
Hemoglobin: 10.9 g/dL — ABNORMAL LOW (ref 12.0–15.0)
MCH: 28.1 pg (ref 26.0–34.0)
MCHC: 31.6 g/dL (ref 30.0–36.0)
MCV: 88.9 fL (ref 80.0–100.0)
Platelets: 211 10*3/uL (ref 150–400)
RBC: 3.88 MIL/uL (ref 3.87–5.11)
RDW: 14 % (ref 11.5–15.5)
WBC: 9.1 10*3/uL (ref 4.0–10.5)
nRBC: 0 % (ref 0.0–0.2)

## 2024-05-21 LAB — FERRITIN: Ferritin: 56 ng/mL (ref 11–307)

## 2024-05-21 LAB — RENAL FUNCTION PANEL
Albumin: 3.4 g/dL — ABNORMAL LOW (ref 3.5–5.0)
Anion gap: 10 (ref 5–15)
BUN: 34 mg/dL — ABNORMAL HIGH (ref 8–23)
CO2: 23 mmol/L (ref 22–32)
Calcium: 9 mg/dL (ref 8.9–10.3)
Chloride: 104 mmol/L (ref 98–111)
Creatinine, Ser: 2.08 mg/dL — ABNORMAL HIGH (ref 0.44–1.00)
GFR, Estimated: 24 mL/min — ABNORMAL LOW (ref >60)
Glucose, Bld: 79 mg/dL (ref 70–99)
Phosphorus: 3.4 mg/dL (ref 2.5–4.6)
Potassium: 4 mmol/L (ref 3.5–5.1)
Sodium: 137 mmol/L (ref 135–145)

## 2024-05-21 LAB — PROTEIN / CREATININE RATIO, URINE
Creatinine, Urine: 96 mg/dL
Total Protein, Urine: <6 mg/dL

## 2024-05-21 LAB — IRON AND TIBC
Iron: 55 ug/dL (ref 28–170)
Saturation Ratios: 20 % (ref 10.4–31.8)
TIBC: 277 ug/dL (ref 250–450)
UIBC: 222 ug/dL

## 2024-05-25 ENCOUNTER — Other Ambulatory Visit: Payer: Self-pay | Admitting: Cardiology

## 2024-05-29 DIAGNOSIS — I5032 Chronic diastolic (congestive) heart failure: Secondary | ICD-10-CM | POA: Diagnosis not present

## 2024-05-29 DIAGNOSIS — E876 Hypokalemia: Secondary | ICD-10-CM | POA: Diagnosis not present

## 2024-05-29 DIAGNOSIS — N184 Chronic kidney disease, stage 4 (severe): Secondary | ICD-10-CM | POA: Diagnosis not present

## 2024-05-29 DIAGNOSIS — N049 Nephrotic syndrome with unspecified morphologic changes: Secondary | ICD-10-CM | POA: Diagnosis not present

## 2024-06-18 ENCOUNTER — Other Ambulatory Visit (HOSPITAL_COMMUNITY)
Admission: RE | Admit: 2024-06-18 | Discharge: 2024-06-18 | Disposition: A | Discharge status: Home / Self Care | Source: Ambulatory Visit | Attending: Nephrology | Admitting: Nephrology

## 2024-06-18 DIAGNOSIS — N189 Chronic kidney disease, unspecified: Secondary | ICD-10-CM | POA: Insufficient documentation

## 2024-06-18 LAB — RENAL FUNCTION PANEL
Albumin: 3.8 g/dL (ref 3.5–5.0)
Anion gap: 13 (ref 5–15)
BUN: 34 mg/dL — ABNORMAL HIGH (ref 8–23)
CO2: 22 mmol/L (ref 22–32)
Calcium: 9.4 mg/dL (ref 8.9–10.3)
Chloride: 105 mmol/L (ref 98–111)
Creatinine, Ser: 2.33 mg/dL — ABNORMAL HIGH (ref 0.44–1.00)
GFR, Estimated: 21 mL/min — ABNORMAL LOW (ref >60)
Glucose, Bld: 106 mg/dL — ABNORMAL HIGH (ref 70–99)
Phosphorus: 3.1 mg/dL (ref 2.5–4.6)
Potassium: 3.9 mmol/L (ref 3.5–5.1)
Sodium: 140 mmol/L (ref 135–145)

## 2024-06-25 ENCOUNTER — Observation Stay (HOSPITAL_COMMUNITY)
Admission: EM | Admit: 2024-06-25 | Discharge: 2024-06-26 | Disposition: A | Discharge status: Home / Self Care | Source: Ambulatory Visit | Attending: Family Medicine | Admitting: Family Medicine

## 2024-06-25 ENCOUNTER — Encounter (HOSPITAL_COMMUNITY): Payer: Self-pay

## 2024-06-25 ENCOUNTER — Other Ambulatory Visit: Payer: Self-pay

## 2024-06-25 ENCOUNTER — Emergency Department (HOSPITAL_COMMUNITY)

## 2024-06-25 DIAGNOSIS — I5030 Unspecified diastolic (congestive) heart failure: Secondary | ICD-10-CM | POA: Diagnosis not present

## 2024-06-25 DIAGNOSIS — N049 Nephrotic syndrome with unspecified morphologic changes: Secondary | ICD-10-CM | POA: Diagnosis not present

## 2024-06-25 DIAGNOSIS — N179 Acute kidney failure, unspecified: Secondary | ICD-10-CM | POA: Diagnosis not present

## 2024-06-25 DIAGNOSIS — I5032 Chronic diastolic (congestive) heart failure: Secondary | ICD-10-CM | POA: Diagnosis not present

## 2024-06-25 DIAGNOSIS — F17220 Nicotine dependence, chewing tobacco, uncomplicated: Secondary | ICD-10-CM | POA: Insufficient documentation

## 2024-06-25 DIAGNOSIS — N184 Chronic kidney disease, stage 4 (severe): Secondary | ICD-10-CM | POA: Diagnosis not present

## 2024-06-25 DIAGNOSIS — R0600 Dyspnea, unspecified: Secondary | ICD-10-CM | POA: Diagnosis present

## 2024-06-25 DIAGNOSIS — R0609 Other forms of dyspnea: Secondary | ICD-10-CM | POA: Diagnosis not present

## 2024-06-25 DIAGNOSIS — R0602 Shortness of breath: Secondary | ICD-10-CM | POA: Diagnosis not present

## 2024-06-25 DIAGNOSIS — E1122 Type 2 diabetes mellitus with diabetic chronic kidney disease: Secondary | ICD-10-CM | POA: Insufficient documentation

## 2024-06-25 DIAGNOSIS — I358 Other nonrheumatic aortic valve disorders: Secondary | ICD-10-CM | POA: Diagnosis not present

## 2024-06-25 DIAGNOSIS — Z7982 Long term (current) use of aspirin: Secondary | ICD-10-CM | POA: Insufficient documentation

## 2024-06-25 DIAGNOSIS — I130 Hypertensive heart and chronic kidney disease with heart failure and stage 1 through stage 4 chronic kidney disease, or unspecified chronic kidney disease: Secondary | ICD-10-CM | POA: Insufficient documentation

## 2024-06-25 DIAGNOSIS — Z720 Tobacco use: Secondary | ICD-10-CM | POA: Diagnosis not present

## 2024-06-25 DIAGNOSIS — I10 Essential (primary) hypertension: Secondary | ICD-10-CM | POA: Diagnosis not present

## 2024-06-25 DIAGNOSIS — J449 Chronic obstructive pulmonary disease, unspecified: Secondary | ICD-10-CM | POA: Diagnosis not present

## 2024-06-25 LAB — CBC WITH DIFFERENTIAL/PLATELET
Abs Immature Granulocytes: 0.02 K/uL (ref 0.00–0.07)
Basophils Absolute: 0.1 K/uL (ref 0.0–0.1)
Basophils Relative: 1 %
Eosinophils Absolute: 0 K/uL (ref 0.0–0.5)
Eosinophils Relative: 0 %
HCT: 36.5 % (ref 36.0–46.0)
Hemoglobin: 11.6 g/dL — ABNORMAL LOW (ref 12.0–15.0)
Immature Granulocytes: 0 %
Lymphocytes Relative: 19 %
Lymphs Abs: 1.5 K/uL (ref 0.7–4.0)
MCH: 27.4 pg (ref 26.0–34.0)
MCHC: 31.8 g/dL (ref 30.0–36.0)
MCV: 86.3 fL (ref 80.0–100.0)
Monocytes Absolute: 0.5 K/uL (ref 0.1–1.0)
Monocytes Relative: 6 %
Neutro Abs: 5.6 K/uL (ref 1.7–7.7)
Neutrophils Relative %: 74 %
Platelets: 205 K/uL (ref 150–400)
RBC: 4.23 MIL/uL (ref 3.87–5.11)
RDW: 14.3 % (ref 11.5–15.5)
WBC: 7.6 K/uL (ref 4.0–10.5)
nRBC: 0 % (ref 0.0–0.2)

## 2024-06-25 LAB — TROPONIN I (HIGH SENSITIVITY)
Troponin I (High Sensitivity): 10 ng/L (ref <18)
Troponin I (High Sensitivity): 13 ng/L (ref <18)

## 2024-06-25 LAB — COMPREHENSIVE METABOLIC PANEL WITH GFR
ALT: 14 U/L (ref 0–44)
AST: 26 U/L (ref 15–41)
Albumin: 3.9 g/dL (ref 3.5–5.0)
Alkaline Phosphatase: 75 U/L (ref 38–126)
Anion gap: 16 — ABNORMAL HIGH (ref 5–15)
BUN: 31 mg/dL — ABNORMAL HIGH (ref 8–23)
CO2: 16 mmol/L — ABNORMAL LOW (ref 22–32)
Calcium: 9.6 mg/dL (ref 8.9–10.3)
Chloride: 105 mmol/L (ref 98–111)
Creatinine, Ser: 2.26 mg/dL — ABNORMAL HIGH (ref 0.44–1.00)
GFR, Estimated: 22 mL/min — ABNORMAL LOW (ref >60)
Glucose, Bld: 92 mg/dL (ref 70–99)
Potassium: 3.9 mmol/L (ref 3.5–5.1)
Sodium: 137 mmol/L (ref 135–145)
Total Bilirubin: 0.8 mg/dL (ref 0.0–1.2)
Total Protein: 7.8 g/dL (ref 6.5–8.1)

## 2024-06-25 LAB — URINALYSIS, ROUTINE W REFLEX MICROSCOPIC
Bilirubin Urine: NEGATIVE
Glucose, UA: NEGATIVE mg/dL
Hgb urine dipstick: NEGATIVE
Ketones, ur: NEGATIVE mg/dL
Leukocytes,Ua: NEGATIVE
Nitrite: NEGATIVE
Protein, ur: NEGATIVE mg/dL
Specific Gravity, Urine: 1.009 (ref 1.005–1.030)
pH: 5 (ref 5.0–8.0)

## 2024-06-25 LAB — D-DIMER, QUANTITATIVE: D-Dimer, Quant: 1.01 ug{FEU}/mL — ABNORMAL HIGH (ref 0.00–0.50)

## 2024-06-25 LAB — BRAIN NATRIURETIC PEPTIDE: B Natriuretic Peptide: 18 pg/mL (ref 0.0–100.0)

## 2024-06-25 MED ORDER — ONDANSETRON HCL 4 MG/2ML IJ SOLN: 4.0000 mg | Freq: Four times a day (QID) | INTRAMUSCULAR | Status: DC | PRN

## 2024-06-25 MED ORDER — ASPIRIN 81 MG PO TBEC
81.0000 mg | DELAYED_RELEASE_TABLET | Freq: Every day | ORAL | Status: DC
Start: 2024-06-26 — End: 2024-06-26
  Administered 2024-06-26: 81 mg via ORAL
  Filled 2024-06-25: qty 1

## 2024-06-25 MED ORDER — ENSURE PLUS HIGH PROTEIN PO LIQD
237.0000 mL | Freq: Two times a day (BID) | ORAL | Status: DC
  Administered 2024-06-26 (×2): 237 mL via ORAL

## 2024-06-25 MED ORDER — ENOXAPARIN SODIUM 80 MG/0.8ML IJ SOSY
1.0000 mg/kg | PREFILLED_SYRINGE | INTRAMUSCULAR | Status: AC
  Administered 2024-06-25: 67.5 mg via SUBCUTANEOUS
  Filled 2024-06-25: qty 0.8

## 2024-06-25 MED ORDER — ACETAMINOPHEN 325 MG PO TABS: 650.0000 mg | ORAL_TABLET | Freq: Four times a day (QID) | ORAL | Status: DC | PRN

## 2024-06-25 MED ORDER — HYDRALAZINE HCL 50 MG PO TABS
100.0000 mg | ORAL_TABLET | Freq: Three times a day (TID) | ORAL | Status: DC
  Administered 2024-06-25 – 2024-06-26 (×2): 100 mg via ORAL
  Filled 2024-06-25 (×2): qty 2

## 2024-06-25 MED ORDER — ACETAMINOPHEN 650 MG RE SUPP: 650.0000 mg | Freq: Four times a day (QID) | RECTAL | Status: DC | PRN

## 2024-06-25 MED ORDER — SODIUM CHLORIDE 0.9 % IV BOLUS
500.0000 mL | Freq: Once | INTRAVENOUS | Status: AC
  Administered 2024-06-25: 500 mL via INTRAVENOUS

## 2024-06-25 MED ORDER — POLYETHYLENE GLYCOL 3350 17 G PO PACK: 17.0000 g | PACK | Freq: Every day | ORAL | Status: DC | PRN

## 2024-06-25 MED ORDER — ONDANSETRON HCL 4 MG PO TABS: 4.0000 mg | ORAL_TABLET | Freq: Four times a day (QID) | ORAL | Status: DC | PRN

## 2024-06-25 MED ORDER — NIFEDIPINE ER OSMOTIC RELEASE 30 MG PO TB24
90.0000 mg | ORAL_TABLET | Freq: Every day | ORAL | Status: DC
  Administered 2024-06-26: 90 mg via ORAL
  Filled 2024-06-25: qty 3

## 2024-06-25 NOTE — Assessment & Plan Note (Signed)
 Cardiac murmur on exam.  Last echo 12/2022.  Mild aortic valve thickening.  Otherwise, no significant valvular abnormality.

## 2024-06-25 NOTE — H&P (Signed)
 History and Physical    Deborah Gomez FMW:[id number] DOB: [birth year] DOA: 06/25/2024  PCP: Cook, Jayce G, DO   Patient coming from: Home  I have personally briefly reviewed patient's old medical records in [facility]  Chief Complaint: Difficulty breathing  HPI: Deborah Gomez is a 78 y.o. female with medical history significant for CKD 4, hypertension, goiter. Patient presented to the ED with complaints of difficulty breathing with activity.  She reports symptoms started about 3 days ago.  She noticed difficulty breathing walking from her bathroom to her bedroom.  She went to see a nephrologist today, she was noticeably short of breath, she was referred to the ED.  She still appeared short of breath when she came to the ED. She denies chest pain or chest tightness. No cough. No lower extremity swelling.  She smokes cigarettes.  ED Course: Temperature 98.1. Tachycardic to 110 - heart rate range 87-110.  Respiratory rate 17-28.  Blood pressure 113-168.  O2 sats greater than 98% on room air. On my evaluation, patient is calm, back to baseline she denies dyspnea. D-dimer 1.01. BNP 18. Troponin 10 Chest x-ray negative for acute abnormality.  EKG shows sinus tachycardia without change from prior. Due to renal insufficiency, CTA could not be obtained.  Hospitalist called to admit for further workup.  Review of Systems: As per HPI all other systems reviewed and negative.  Past Medical History:  Diagnosis Date   Aortic valve sclerosis    Chronic kidney disease    CKD (chronic kidney disease), stage IV (HCC)    Diabetes mellitus (HCC)    Diastolic dysfunction    Hypertension    LVH (left ventricular hypertrophy)     Past Surgical History:  Procedure Laterality Date   APPENDECTOMY     BREAST SURGERY Left    CARPAL TUNNEL RELEASE Right    CATARACT EXTRACTION W/PHACO Right 07/30/2022   Procedure: CATARACT EXTRACTION PHACO AND INTRAOCULAR LENS PLACEMENT (IOC);  Surgeon: Harrie Agent, MD;  Location: [facility];  Service: Ophthalmology;  Laterality: Right;  CDE:19.32   CATARACT EXTRACTION W/PHACO Left 08/27/2022   Procedure: CATARACT EXTRACTION PHACO AND INTRAOCULAR LENS PLACEMENT (IOC);  Surgeon: Harrie Agent, MD;  Location: [facility];  Service: Ophthalmology;  Laterality: Left;  CDE: 32.13   REPLACEMENT TOTAL KNEE Left    THYROID  SURGERY     partial removal     reports that she has been smoking cigarettes. She started smoking about 54 years ago. She has a 13 pack-year smoking history. She has been exposed to tobacco smoke. She has never used smokeless tobacco. She reports that she does not currently use alcohol. She reports that she does not use drugs.  No Known Allergies  Family History  Adopted: Yes  Problem Relation Age of Onset   Colon cancer Neg Hx    Colon polyps Neg Hx    Prior to Admission medications   Medication Sig Start Date End Date Taking? Authorizing Provider  acetaminophen  (TYLENOL ) 500 MG tablet Take 1,000 mg by mouth every 6 (six) hours as needed for mild pain (pain score 1-3).   Yes [provider]  aspirin  EC 81 MG tablet Take 81 mg by mouth daily. Swallow whole.   Yes [provider]  gabapentin  (NEURONTIN ) 100 MG capsule Take 2 capsules (200 mg total) by mouth 3 (three) times daily. 01/27/24  Yes Cook, Jayce G, DO  hydrALAZINE  (APRESOLINE ) 100 MG tablet Take 100 mg by mouth 3 (three) times daily. 05/29/24  Yes  [provider]  losartan  (COZAAR ) 50 MG tablet Take 50 mg by mouth every Monday, Wednesday, and Friday. 11/02/21  Yes [provider]  NIFEdipine  (ADALAT  CC) 90 MG 24 hr tablet Take 90 mg by mouth daily. 05/06/24  Yes [provider]  Potassium Chloride ER 20 MEQ TBCR Take 1 tablet by mouth daily. 04/05/24  Yes [provider]    Physical Exam: Vitals:   06/25/24 1645 06/25/24 1730 06/25/24 1745 06/25/24 1800  BP: (!) 152/58 (!) 144/60 (!) 140/61 (!) 113/102  Pulse: 93   87  Resp:  (!) 28 16 (!) 23 (!) 27  Temp:      TempSrc:      SpO2: 98%   98%  Weight:      Height:        Constitutional: NAD, calm, comfortable Vitals:   06/25/24 1645 06/25/24 1730 06/25/24 1745 06/25/24 1800  BP: (!) 152/58 (!) 144/60 (!) 140/61 (!) 113/102  Pulse: 93   87  Resp: (!) 28 16 (!) 23 (!) 27  Temp:      TempSrc:      SpO2: 98%   98%  Weight:      Height:       Eyes: PERRL, lids and conjunctivae normal ENMT: Mucous membranes are moist.  Neck: normal, supple, no masses, no thyromegaly Respiratory: clear to auscultation bilaterally, no wheezing, no crackles.  No accessory muscle use. Cardiovascular: Regular rate and rhythm, 3/6 murmur -reports murmur as a child./ no rubs / gallops. No extremity edema.   Abdomen: no tenderness, no masses palpated. No hepatosplenomegaly. Bowel sounds positive.  Musculoskeletal: no clubbing / cyanosis. No joint deformity upper and lower extremities.  Skin: no rashes, lesions, ulcers. No induration Neurologic: No facial asymmetry, moving extremities spontaneously, speech fluent. Psychiatric: Normal judgment and insight. Alert and oriented x 3. Normal mood.   Labs on Admission: I have personally reviewed following labs and imaging studies  CBC: Recent Labs  Lab 06/25/24 1705  WBC 7.6  NEUTROABS 5.6  HGB 11.6*  HCT 36.5  MCV 86.3  PLT 205   Basic Metabolic Panel: Recent Labs  Lab 06/25/24 1705  NA 137  K 3.9  CL 105  CO2 16*  GLUCOSE 92  BUN 31*  CREATININE 2.26*  CALCIUM  9.6   GFR: Estimated Creatinine Clearance: 19.3 mL/min (A) (by C-G formula based on SCr of 2.26 mg/dL (H)). Liver Function Tests: Recent Labs  Lab 06/25/24 1705  AST 26  ALT 14  ALKPHOS 75  BILITOT 0.8  PROT 7.8  ALBUMIN  3.9   Urine analysis:    Component Value Date/Time   COLORURINE YELLOW 01/15/2023 1839   APPEARANCEUR CLOUDY (A) 01/15/2023 1839   LABSPEC 1.010 01/15/2023 1839   PHURINE 5.0 01/15/2023 1839   GLUCOSEU >=500 (A) 01/15/2023 1839    HGBUR SMALL (A) 01/15/2023 1839   BILIRUBINUR NEGATIVE 01/15/2023 1839   KETONESUR NEGATIVE 01/15/2023 1839   PROTEINUR 30 (A) 01/15/2023 1839   NITRITE NEGATIVE 01/15/2023 1839   LEUKOCYTESUR NEGATIVE 01/15/2023 1839    Radiological Exams on Admission: DG Chest 2 View Result Date: 06/25/2024 CLINICAL DATA:  Shortness of breath. EXAM: CHEST - 2 VIEW COMPARISON:  Chest radiograph dated 01/27/2024. FINDINGS: The heart size and mediastinal contours are within normal limits. Both lungs are clear. The visualized skeletal structures are unremarkable. IMPRESSION: No active cardiopulmonary disease. Electronically Signed   By: Vanetta Chou M.D.   On: 06/25/2024 17:42   EKG: Independently reviewed.   Assessment/Plan  Principal Problem:   Dyspnea on exertion Active Problems:   Hypertension   CKD (chronic kidney disease) stage 4, GFR 15-29 ml/min (HCC)   Aortic valve sclerosis  Assessment and Plan: * Dyspnea on exertion On initial presentation to the ED, she was tachycardic to 110, tachypneic-28.  Now back to baseline, evidence of dyspnea.  No lower extremity swelling.  Two-view chest x-ray without acute abnormality.  Chest exam-clear to auscultation.  Smokes cigarettes.  Troponin 10 > 13.  EKG unchanged.  D-dimer mildly elevated at 1.01.  No chest pain.  No lower extremity swelling.  Due to CKD, CTA was not done. -Therapeutic dose Lovenox  for now pending VQ scan in a.m. -Further workup pending result  Aortic valve sclerosis Cardiac murmur on exam.  Last echo 12/2022.  Mild aortic valve thickening.  Otherwise, no significant valvular abnormality.  CKD (chronic kidney disease) stage 4, GFR 15-29 ml/min (HCC) Creatinine 2.26, gradual uptrend over the past 5 months from 1.5.  Follows with Dr. Rachele.  Tobacco abuse Unable to tell me how much she actually smokes.  Hypertension Stable. -Resume nifedipine , hydralazine . -Losartan  recently discontinued by her outpatient provider   DVT  prophylaxis: Therapeutic dose Lovenox  x 1. Code Status: FULL Family Communication: Daughter Charlene at bedside Disposition Plan: ~ 1 day Consults called: None Admission status:  Obs tele   Author: Tully FORBES Carwin, MD 06/25/2024 8:25 PM  For on call review [email or web address].

## 2024-06-25 NOTE — Assessment & Plan Note (Signed)
 Stable. -Resume nifedipine , hydralazine . -Losartan  recently discontinued by her outpatient provider

## 2024-06-25 NOTE — ED Triage Notes (Signed)
 Pt arrived via POV c/o worsening SOB that occurred earlier today. Pt also presents with a piece aof paper from Dr Rachele which looks as though the Pt has multiple blood tests ordered to have prior to her next follow-up appointment. Pt denies chest pain, but describes her chest feels very tight.

## 2024-06-25 NOTE — Assessment & Plan Note (Signed)
 Creatinine 2.26, gradual uptrend over the past 5 months from 1.5.  Follows with Dr. Rachele.

## 2024-06-25 NOTE — Plan of Care (Signed)
  Problem: Clinical Measurements: Goal: Respiratory complications will improve Outcome: Progressing   Problem: Activity: Goal: Risk for activity intolerance will decrease Outcome: Progressing   Problem: Coping: Goal: Level of anxiety will decrease Outcome: Progressing   Problem: Elimination: Goal: Will not experience complications related to urinary retention Outcome: Progressing   Problem: Pain Managment: Goal: General experience of comfort will improve and/or be controlled Outcome: Progressing   Problem: Safety: Goal: Ability to remain free from injury will improve Outcome: Progressing   Problem: Skin Integrity: Goal: Risk for impaired skin integrity will decrease Outcome: Progressing

## 2024-06-25 NOTE — ED Notes (Signed)
 Pt assisted to bathroom to void via wheelchair and standby assist. Pt tolerated well. Specimen sent to lab. Denies SHOB/ CP.

## 2024-06-25 NOTE — Assessment & Plan Note (Signed)
 On initial presentation to the ED, she was tachycardic to 110, tachypneic-28.  Now back to baseline, evidence of dyspnea.  No lower extremity swelling.  Two-view chest x-ray without acute abnormality.  Chest exam-clear to auscultation.  Smokes cigarettes.  Troponin 10 > 13.  EKG unchanged.  D-dimer mildly elevated at 1.01.  No chest pain.  No lower extremity swelling.  Due to CKD, CTA was not done. -Therapeutic dose Lovenox  for now pending VQ scan in a.m. -Further workup pending result

## 2024-06-25 NOTE — ED Provider Notes (Signed)
 [place] EMERGENCY DEPARTMENT AT [facility] Provider Note   CSN: [id number] Arrival date & time: 06/25/24  1621     Patient presents with: Shortness of Breath   Deborah Gomez is a 78 y.o. female.  History of HTN, CKD , anemia, aortic sclerosis, CHF.  Presents ER today complaining of shortness of breath that started today.  States she always have some baseline shortness of breath that is on and off and usually with walking around her house but today feels very unwell.  She had a follow-up appointment with her kidney specialist today who has been following her worsening renal function.  He recently stopped her lisinopril and today I told her to stop her Lasix  but to come to the ER for evaluation for her symptoms. States she compliant with all of her meds, she extremity swelling or pain.  Denies any chest pain.  No nausea or vomiting, no fevers, states she feels cold today.    Shortness of Breath      Prior to Admission medications   Medication Sig Start Date End Date Taking? Authorizing Provider  aspirin  EC 81 MG tablet Take 81 mg by mouth daily. Swallow whole.    [provider]  calcitRIOL (ROCALTROL) 0.25 MCG capsule Take 0.25 mcg by mouth every Monday, Wednesday, and Friday.    [provider]  epoetin  alfa (EPOGEN ) 3000 UNIT/ML injection Inject 3,000 Units into the skin every 14 (fourteen) days. Patient has not started medication as of 02/06/2023. 02/04/23   [provider]  furosemide  (LASIX ) 20 MG tablet Take 1 tablet by mouth twice daily 05/28/24   Alvan Dorn FALCON, MD  gabapentin  (NEURONTIN ) 100 MG capsule Take 2 capsules (200 mg total) by mouth 3 (three) times daily. 01/27/24   Cook, Jayce G, DO  hydrALAZINE  (APRESOLINE ) 50 MG tablet Take 2 tablets (100 mg total) by mouth 2 (two) times daily. 12/06/23   Alvan Dorn FALCON, MD  losartan  (COZAAR ) 50 MG tablet Take 50 mg by mouth every Monday, Wednesday, and Friday. 11/02/21   [provider]  NIFEdipine  (PROCARDIA  XL/NIFEDICAL-XL) 90 MG 24 hr tablet Take 90 mg by mouth daily. 11/02/21   [provider]  potassium chloride SA (KLOR-CON M) 20 MEQ tablet Take 20 mEq by mouth daily. 04/01/23   [provider]  rosuvastatin  (CRESTOR ) 40 MG tablet Take 1 tablet by mouth once daily 02/26/24   Cook, Jayce G, DO    Allergies: Patient has no known allergies.    Review of Systems  Respiratory:  Positive for shortness of breath.     Updated Vital Signs BP (!) 152/58   Pulse 93   Temp 98.1 F (36.7 C) (Oral)   Resp (!) 28   Ht 5' 4 (1.626 m)   Wt 67.1 kg   SpO2 98%   BMI 25.39 kg/m   Physical Exam Vitals and nursing note reviewed.  Constitutional:      General: She is not in acute distress.    Appearance: She is well-developed.  HENT:     Head: Normocephalic and atraumatic.  Eyes:     Extraocular Movements: Extraocular movements intact.     Conjunctiva/sclera: Conjunctivae normal.     Pupils: Pupils are equal, round, and reactive to light.  Cardiovascular:     Rate and Rhythm: Normal rate and regular rhythm.     Heart sounds: No murmur heard. Pulmonary:     Effort: Pulmonary effort is normal. Tachypnea present. No respiratory distress.  Breath sounds: Normal breath sounds.  Abdominal:     Palpations: Abdomen is soft.     Tenderness: There is no abdominal tenderness.  Musculoskeletal:        General: No swelling. Normal range of motion.     Cervical back: Neck supple.     Right lower leg: No tenderness. No edema.     Left lower leg: No tenderness. No edema.  Skin:    General: Skin is warm and dry.     Capillary Refill: Capillary refill takes less than 2 seconds.  Neurological:     General: No focal deficit present.     Mental Status: She is alert and oriented to person, place, and time.  Psychiatric:        Mood and Affect: Mood normal.        Behavior: Behavior normal.     (all labs ordered are listed, but only abnormal  results are displayed) Labs Reviewed  CBC WITH DIFFERENTIAL/PLATELET  COMPREHENSIVE METABOLIC PANEL WITH GFR  BRAIN NATRIURETIC PEPTIDE  TROPONIN I (HIGH SENSITIVITY)    EKG: EKG Interpretation Date/Time:  Thursday June 25 2024 16:42:45 EDT Ventricular Rate:  100 PR Interval:  158 QRS Duration:  61 QT Interval:  339 QTC Calculation: 438 R Axis:   8  Text Interpretation: Sinus tachycardia Consider right atrial enlargement Confirmed by Cleotilde Rogue ([id number]) on 06/25/2024 4:44:56 PM  Radiology: No results found.   Procedures   Medications Ordered in the ED - No data to display                                  Medical Decision Making This patient presents to the ED for concern of breath at rest, chest tightness, this involves an extensive number of treatment options, and is a complaint that carries with it a high risk of complications and morbidity.  The differential diagnosis includes ACS, PE, pneumonia, electrolyte disturbance, infection, other   Co morbidities that complicate the patient evaluation :  CKD, pneumonia   Additional history obtained:  Additional history obtained from EMR External records from outside source obtained and reviewed including ER notes and labs   Lab Tests:  I Ordered, and personally interpreted labs.  The pertinent results include: CO2 16 anion gap 16 BUN 31 creatinine 2.26 this very slightly increased from baseline no leukocytosis and stable hemoglobin   Imaging Studies ordered:  I ordered imaging studies including x-ray past which shows no pulmonary edema, infiltrate, pneumothorax or other acute findings I independently visualized and interpreted imaging within scope of identifying emergent findings  I agree with the radiologist interpretation   Cardiac Monitoring: / EKG:  The patient was maintained on a cardiac monitor.  I personally viewed and interpreted the cardiac monitored which showed an underlying rhythm of: Sinus rhythm  initially sinus tachycardia rate 110, now improved to a rate of 87   Consultations Obtained:  I requested consultation with the hospitalist,  and discussed lab and imaging findings as well as pertinent plan - they recommend: Admission for V/Q tomorrow, Dr. Pearlean will decide when she evaluates patient whether or not to pursue empiric anticoagulation   Problem List / ED Course / Critical interventions / Medication management  Shortness of breath at rest today-this is not patient's baseline she is persistently tachypneic in the ER and just feeling generally unwell no other specific symptoms, lungs are clear, no leg swelling or pain.  No  history of VTE but she is not anticoagulated.  I am concerned about possible PE and plan for CTA chest but due to patient's renal dysfunction we cannot get this today, discussed with hospitalist for admission for VQ scan in light of elevated D-dimer at 1.01.  I have reviewed the patients home medicines and have made adjustments as needed    Amount and/or Complexity of Data Reviewed Labs: ordered. Radiology: ordered.  Risk Decision regarding hospitalization.        Final diagnoses:  None    ED Discharge Orders     None          Suellen Sherran DELENA DEVONNA 06/25/24 ARTEMUS Cleotilde Rogue, MD 06/26/24 1723

## 2024-06-25 NOTE — Assessment & Plan Note (Signed)
 Unable to tell me how much she actually smokes.

## 2024-06-25 NOTE — ED Notes (Addendum)
 Patient in room, calm cooperative. Respirations even and unlabored

## 2024-06-26 ENCOUNTER — Observation Stay (HOSPITAL_COMMUNITY)

## 2024-06-26 ENCOUNTER — Encounter (HOSPITAL_COMMUNITY): Payer: Self-pay | Admitting: Internal Medicine

## 2024-06-26 DIAGNOSIS — R0609 Other forms of dyspnea: Secondary | ICD-10-CM | POA: Diagnosis not present

## 2024-06-26 LAB — CBC
HCT: 33.1 % — ABNORMAL LOW (ref 36.0–46.0)
Hemoglobin: 10.2 g/dL — ABNORMAL LOW (ref 12.0–15.0)
MCH: 27.3 pg (ref 26.0–34.0)
MCHC: 30.8 g/dL (ref 30.0–36.0)
MCV: 88.7 fL (ref 80.0–100.0)
Platelets: 178 K/uL (ref 150–400)
RBC: 3.73 MIL/uL — ABNORMAL LOW (ref 3.87–5.11)
RDW: 14.6 % (ref 11.5–15.5)
WBC: 6.1 K/uL (ref 4.0–10.5)
nRBC: 0 % (ref 0.0–0.2)

## 2024-06-26 LAB — BASIC METABOLIC PANEL WITH GFR
Anion gap: 12 (ref 5–15)
BUN: 30 mg/dL — ABNORMAL HIGH (ref 8–23)
CO2: 21 mmol/L — ABNORMAL LOW (ref 22–32)
Calcium: 8.8 mg/dL — ABNORMAL LOW (ref 8.9–10.3)
Chloride: 106 mmol/L (ref 98–111)
Creatinine, Ser: 2 mg/dL — ABNORMAL HIGH (ref 0.44–1.00)
GFR, Estimated: 25 mL/min — ABNORMAL LOW (ref >60)
Glucose, Bld: 72 mg/dL (ref 70–99)
Potassium: 3.5 mmol/L (ref 3.5–5.1)
Sodium: 139 mmol/L (ref 135–145)

## 2024-06-26 MED ORDER — ASPIRIN EC 81 MG PO TBEC: 81.0000 mg | DELAYED_RELEASE_TABLET | Freq: Every day | ORAL | 5 refills | Status: AC

## 2024-06-26 MED ORDER — HYDRALAZINE HCL 100 MG PO TABS: 100.0000 mg | ORAL_TABLET | Freq: Three times a day (TID) | ORAL | 5 refills | Status: DC

## 2024-06-26 MED ORDER — GABAPENTIN 100 MG PO CAPS: 100.0000 mg | ORAL_CAPSULE | Freq: Three times a day (TID) | ORAL | 5 refills | Status: DC

## 2024-06-26 MED ORDER — NIFEDIPINE ER 90 MG PO TB24
90.0000 mg | ORAL_TABLET | Freq: Every day | ORAL | 5 refills | Status: DC
Start: 2024-06-26 — End: 2024-09-01

## 2024-06-26 MED ORDER — LOSARTAN POTASSIUM 50 MG PO TABS: 50.0000 mg | ORAL_TABLET | Freq: Every day | ORAL | 5 refills | Status: DC

## 2024-06-26 MED ORDER — FLUTICASONE FUROATE-VILANTEROL 100-25 MCG/ACT IN AEPB: 1.0000 | INHALATION_SPRAY | Freq: Every day | RESPIRATORY_TRACT | 5 refills | Status: AC

## 2024-06-26 MED ORDER — TECHNETIUM TO 99M ALBUMIN AGGREGATED
4.0000 | Freq: Once | INTRAVENOUS | Status: AC | PRN
  Administered 2024-06-26: 4.4 via INTRAVENOUS

## 2024-06-26 MED ORDER — NICOTINE 21 MG/24HR TD PT24: 21.0000 mg | MEDICATED_PATCH | TRANSDERMAL | 0 refills | Status: AC

## 2024-06-26 MED ORDER — ACETAMINOPHEN 325 MG PO TABS: 650.0000 mg | ORAL_TABLET | Freq: Four times a day (QID) | ORAL | Status: AC | PRN

## 2024-06-26 NOTE — Discharge Summary (Signed)
 Deborah Gomez, is a 78 y.o. female  DOB 02-27-1946  MRN [id number].  Admission date:  06/25/2024  Admitting Physician  Tully FORBES Carwin, MD  Discharge Date:  06/26/2024   Primary MD  Cook, Jayce G, DO  Recommendations for primary care physician for things to follow:  1) complete abstinence from tobacco advised--okay to use nicotine  patch to help you quit smoking 2) follow-up with primary care physician within a week for recheck and reevaluation 3) please note that there has been changes to medication 4)Avoid ibuprofen/Advil/Aleve/Motrin/Goody Powders/Naproxen/BC powders/Meloxicam/Diclofenac/Indomethacin and other Nonsteroidal anti-inflammatory medications as these will make you more likely to bleed and can cause stomach ulcers, can also cause Kidney problems.   Admission Diagnosis  Dyspnea on exertion [R06.09] Dyspnea, unspecified type [R06.00]  Discharge Diagnosis  Dyspnea on exertion [R06.09] Dyspnea, unspecified type [R06.00]    Principal Problem:   Dyspnea on exertion Active Problems:   Hypertension   Tobacco abuse   CKD (chronic kidney disease) stage 4, GFR 15-29 ml/min (HCC)   Aortic valve sclerosis      Past Medical History:  Diagnosis Date   Aortic valve sclerosis    Chronic kidney disease    CKD (chronic kidney disease), stage IV (HCC)    Diabetes mellitus (HCC)    Diastolic dysfunction    Hypertension    LVH (left ventricular hypertrophy)     Past Surgical History:  Procedure Laterality Date   APPENDECTOMY     BREAST SURGERY Left    CARPAL TUNNEL RELEASE Right    CATARACT EXTRACTION W/PHACO Right 07/30/2022   Procedure: CATARACT EXTRACTION PHACO AND INTRAOCULAR LENS PLACEMENT (IOC);  Surgeon: Harrie Agent, MD;  Location: [facility];  Service: Ophthalmology;  Laterality: Right;  CDE:19.32   CATARACT EXTRACTION W/PHACO Left 08/27/2022   Procedure: CATARACT EXTRACTION PHACO AND  INTRAOCULAR LENS PLACEMENT (IOC);  Surgeon: Harrie Agent, MD;  Location: [facility];  Service: Ophthalmology;  Laterality: Left;  CDE: 32.13   REPLACEMENT TOTAL KNEE Left    THYROID  SURGERY     partial removal     HPI  from the history and physical done on the day of admission:    HPI: Deborah Gomez is a 78 y.o. female with medical history significant for CKD 4, hypertension, goiter. Patient presented to the ED with complaints of difficulty breathing with activity.  She reports symptoms started about 3 days ago.  She noticed difficulty breathing walking from her bathroom to her bedroom.  She went to see a nephrologist today, she was noticeably short of breath, she was referred to the ED.  She still appeared short of breath when she came to the ED. She denies chest pain or chest tightness. No cough. No lower extremity swelling.  She smokes cigarettes.   ED Course: Temperature 98.1. Tachycardic to 110 - heart rate range 87-110.  Respiratory rate 17-28.  Blood pressure 113-168.  O2 sats greater than 98% on room air. On my evaluation, patient is calm, back to baseline she denies dyspnea. D-dimer 1.01. BNP 18. Troponin 10 Chest  x-ray negative for acute abnormality.  EKG shows sinus tachycardia without change from prior. Due to renal insufficiency, CTA could not be obtained.  Hospitalist called to admit for further workup.   Review of Systems: As per HPI all other systems reviewed and negative.     Hospital Course:    Assessment and Plan: 1)Dyspnea on exertion--suspect COPD related -No lower extremity swelling.  Two-view chest x-ray without acute abnormality.  Chest exam-clear to auscultation.  -Troponin 10 > 13.  EKG unchanged.  D-dimer mildly elevated at 1.01.  No chest pain.  - No lower extremity swelling.  BNP 18 - VQ scan negative for PE -Echo from 01/15/2023 with EF of 65 to 70%, no significant aortic stenosis or mitral stenosis, moderate LVH with grade 1 diastolic dysfunction  noted -  2)CKD (chronic kidney disease) stage 4, GFR 15-29 ml/min (HCC) Creatinine 2.26, gradual uptrend over the past 5 months from 1.5.    -Avoid NSAIDs - Outpatient follow-up with Dr. Rachele patient's primary nephrologist advised  3)Tobacco abuse -Smoking cessation counseling for 4 minutes today,  Give Nicotine  patch I have discussed tobacco cessation with the patient.  I have counseled the patient regarding the negative impacts of continued tobacco use including but not limited to lung cancer, COPD, and cardiovascular disease.  I have discussed alternatives to tobacco and modalities that may help facilitate tobacco cessation including but not limited to biofeedback, hypnosis, and medications.  Total time spent with tobacco counseling was 4 minutes.  4)COPD--may explain patient's dyspnea - Breo Ellipta  as ordered - Follow-up with PCP for further management - Smoking cessation advised as above  5)Hypertension Stable. -c/n  nifedipine , hydralazine . -Losartan  recently discontinued by her outpatient provider  Discharge Condition: stable  Follow UP--nephrologist and PCP as advised  Diet and Activity recommendation:  As advised  Discharge Instructions    Discharge Instructions     Call MD for:  difficulty breathing, headache or visual disturbances   Complete by: As directed    Call MD for:  persistant dizziness or light-headedness   Complete by: As directed    Call MD for:  temperature >100.4   Complete by: As directed    Diet - low sodium heart healthy   Complete by: As directed    Discharge instructions   Complete by: As directed    1) complete abstinence from tobacco advised--okay to use nicotine  patch to help you quit smoking 2) follow-up with primary care physician within a week for recheck and reevaluation 3) please note that there has been changes to medication 4)Avoid ibuprofen/Advil/Aleve/Motrin/Goody Powders/Naproxen/BC powders/Meloxicam/Diclofenac/Indomethacin  and other Nonsteroidal anti-inflammatory medications as these will make you more likely to bleed and can cause stomach ulcers, can also cause Kidney problems.   Increase activity slowly   Complete by: As directed        Discharge Medications     Allergies as of 06/26/2024   No Known Allergies      Medication List     STOP taking these medications    losartan  50 MG tablet Commonly known as: COZAAR    Potassium Chloride ER 20 MEQ Tbcr       TAKE these medications    acetaminophen  325 MG tablet Commonly known as: TYLENOL  Take 2 tablets (650 mg total) by mouth every 6 (six) hours as needed for mild pain (pain score 1-3) or fever (or Fever >/= 101). What changed:  medication strength how much to take reasons to take this   aspirin  EC 81 MG tablet  Take 1 tablet (81 mg total) by mouth daily with breakfast. Swallow whole. What changed: when to take this   fluticasone  furoate-vilanterol 100-25 MCG/ACT Aepb Commonly known as: Breo Ellipta  Inhale 1 puff into the lungs daily.   gabapentin  100 MG capsule Commonly known as: NEURONTIN  Take 1 capsule (100 mg total) by mouth 3 (three) times daily. What changed: how much to take   hydrALAZINE  100 MG tablet Commonly known as: APRESOLINE  Take 1 tablet (100 mg total) by mouth 3 (three) times daily.   nicotine  21 mg/24hr patch Commonly known as: NICODERM CQ  - dosed in mg/24 hours Place 1 patch (21 mg total) onto the skin daily for 28 days.   NIFEdipine  90 MG 24 hr tablet Commonly known as: ADALAT  CC Take 1 tablet (90 mg total) by mouth daily.        Major procedures and Radiology Reports - PLEASE review detailed and final reports for all details, in brief -   NM Pulmonary Perfusion Result Date: 06/26/2024 CLINICAL DATA:  Dyspnea on exertion EXAM: NUCLEAR MEDICINE PERFUSION LUNG SCAN TECHNIQUE: Perfusion images were obtained in multiple projections after intravenous injection of radiopharmaceutical. Ventilation scans  intentionally deferred if perfusion scan and chest x-ray adequate for interpretation during COVID 19 epidemic. RADIOPHARMACEUTICALS:  4.4 mCi Tc-57m MAA IV COMPARISON:  Chest radiograph June 25, 2024 FINDINGS: Normal distribution of tracer activity. No suspicious finding to suggest perfusion filling defect or pulmonary embolism. IMPRESSION: Negative exam. Electronically Signed   By: Duwaine Severs M.D.   On: 06/26/2024 15:25   DG Chest 2 View Result Date: 06/25/2024 CLINICAL DATA:  Shortness of breath. EXAM: CHEST - 2 VIEW COMPARISON:  Chest radiograph dated 01/27/2024. FINDINGS: The heart size and mediastinal contours are within normal limits. Both lungs are clear. The visualized skeletal structures are unremarkable. IMPRESSION: No active cardiopulmonary disease. Electronically Signed   By: Vanetta Chou M.D.   On: 06/25/2024 17:42    Today   Subjective    Deborah Gomez today has no new complaints - Eating and drinking well       Daughter Charlene at bedside, questions answered-- Pt ambulated with mobility specialist O2 sats 97 to 100% with an post ambulation No fever  Or chills  No Nausea, Vomiting or Diarrhea - No chest pain palpitations no dizziness- -no productive cough   Patient has been seen and examined prior to discharge   Objective   Blood pressure (!) 156/69, pulse 77, temperature 97.9 F (36.6 C), temperature source Oral, resp. rate 18, height 5' 4 (1.626 m), weight 67.1 kg, SpO2 98%.   Intake/Output Summary (Last 24 hours) at 06/26/2024 1554 Last data filed at 06/26/2024 1355 Gross per 24 hour  Intake 480 ml  Output 250 ml  Net 230 ml   Exam Gen:- Awake Alert, no acute distress  HEENT:- [place].AT, No sclera icterus Neck-Supple Neck,No JVD,.  Lungs-  CTAB , good air movement bilaterally CV- S1, S2 normal, regular Abd-  +ve B.Sounds, Abd Soft, No tenderness,    Extremity/Skin:- No  edema,   good pulses Psych-affect is appropriate, oriented x3 Neuro-no new focal  deficits, no tremors    Data Review   CBC w Diff:  Lab Results  Component Value Date   WBC 6.1 06/26/2024   HGB 10.2 (L) 06/26/2024   HGB 10.9 (L) 11/14/2021   HCT 33.1 (L) 06/26/2024   HCT 35.1 11/14/2021   PLT 178 06/26/2024   PLT 243 11/14/2021   LYMPHOPCT 19 06/25/2024   MONOPCT 6  06/25/2024   EOSPCT 0 06/25/2024   BASOPCT 1 06/25/2024   CMP:  Lab Results  Component Value Date   NA 139 06/26/2024   NA 141 04/09/2023   K 3.5 06/26/2024   CL 106 06/26/2024   CO2 21 (L) 06/26/2024   BUN 30 (H) 06/26/2024   BUN 33 (H) 04/09/2023   CREATININE 2.00 (H) 06/26/2024   PROT 7.8 06/25/2024   PROT 6.9 11/14/2021   ALBUMIN  3.9 06/25/2024   ALBUMIN  4.2 11/14/2021   BILITOT 0.8 06/25/2024   BILITOT <0.2 11/14/2021   ALKPHOS 75 06/25/2024   AST 26 06/25/2024   ALT 14 06/25/2024   Total Discharge time is about 33 minutes  Rendall Carwin M.D on 06/26/2024 at 3:54 PM  Go to [email or web address] -  for contact info  [facility] - Office  [phone]

## 2024-06-26 NOTE — TOC CM/SW Note (Signed)
 Transition of Care [facility]) - Inpatient Brief Assessment   Patient Details  Name: Deborah Gomez MRN: [id number] Date of Birth: [birth year]  Transition of Care [facility]) CM/SW Contact:    Lucie Lunger, LCSWA Phone Number: 06/26/2024, 9:42 AM   Clinical Narrative: Transition of Care Department [facility]) has reviewed patient and no TOC needs have been identified at this time. We will continue to monitor patient advancement through interdiciplinary progression rounds. If new patient transition needs arise, please place a TOC consult.  Transition of Care Asessment: Insurance and Status: Insurance coverage has been reviewed Patient has primary care physician: Yes Home environment has been reviewed: From home Prior level of function:: Independent Prior/Current Home Services: No current home services Social Drivers of Health Review: SDOH reviewed no interventions necessary Readmission risk has been reviewed: Yes Transition of care needs: no transition of care needs at this time

## 2024-06-26 NOTE — Progress Notes (Addendum)
 Mobility Specialist Progress Note:    06/26/24 1200  Mobility  Activity Ambulated with assistance  Level of Assistance Standby assist, set-up cues, supervision of patient - no hands on  Assistive Device Cane  Distance Ambulated (ft) 140 ft  Range of Motion/Exercises Active;All extremities  Activity Response Tolerated well  Mobility Referral Yes  Mobility visit 1 Mobility  Mobility Specialist Start Time (ACUTE ONLY) 1200  Mobility Specialist Stop Time (ACUTE ONLY) 1220  Mobility Specialist Time Calculation (min) (ACUTE ONLY) 20 min   Pt received in bed, family in room. MD requesting O2 test, required supervision to stand and ambulate with straight cane. Tolerated well, SpO2 100% on RA at rest. SpO2 97-100% on RA during ambulation, HR 129 EOS. Returned supine, all needs met.  Kareema Keitt Mobility Specialist Please contact via Special educational needs teacher or  Rehab office at [phone]

## 2024-06-26 NOTE — Discharge Instructions (Addendum)
 1) complete abstinence from tobacco advised--okay to use nicotine  patch to help you quit smoking 2) follow-up with primary care physician within a week for recheck and reevaluation 3) please note that there has been changes to medication 4)Avoid ibuprofen/Advil/Aleve/Motrin/Goody Powders/Naproxen/BC powders/Meloxicam/Diclofenac/Indomethacin and other Nonsteroidal anti-inflammatory medications as these will make you more likely to bleed and can cause stomach ulcers, can also cause Kidney problems.

## 2024-06-26 NOTE — Care Management Obs Status (Signed)
 MEDICARE OBSERVATION STATUS NOTIFICATION   Patient Details  Name: Deborah Gomez MRN: [id number] Date of Birth: Jul 05, 1946   Medicare Observation Status Notification Given:  Yes    Osmin Welz L Adith Tejada 06/26/2024, 3:57 PM

## 2024-07-15 ENCOUNTER — Other Ambulatory Visit (HOSPITAL_COMMUNITY)
Admission: RE | Admit: 2024-07-15 | Discharge: 2024-07-15 | Disposition: A | Discharge status: Home / Self Care | Source: Ambulatory Visit | Attending: Nephrology | Admitting: Nephrology

## 2024-07-15 DIAGNOSIS — R319 Hematuria, unspecified: Secondary | ICD-10-CM | POA: Diagnosis not present

## 2024-07-15 DIAGNOSIS — D631 Anemia in chronic kidney disease: Secondary | ICD-10-CM | POA: Insufficient documentation

## 2024-07-15 DIAGNOSIS — R809 Proteinuria, unspecified: Secondary | ICD-10-CM | POA: Diagnosis not present

## 2024-07-15 DIAGNOSIS — E221 Hyperprolactinemia: Secondary | ICD-10-CM | POA: Insufficient documentation

## 2024-07-15 DIAGNOSIS — N189 Chronic kidney disease, unspecified: Secondary | ICD-10-CM | POA: Insufficient documentation

## 2024-07-15 DIAGNOSIS — N179 Acute kidney failure, unspecified: Secondary | ICD-10-CM | POA: Insufficient documentation

## 2024-07-15 DIAGNOSIS — D649 Anemia, unspecified: Secondary | ICD-10-CM | POA: Diagnosis present

## 2024-07-15 LAB — RENAL FUNCTION PANEL
Albumin: 3.6 g/dL (ref 3.5–5.0)
Anion gap: 11 (ref 5–15)
BUN: 15 mg/dL (ref 8–23)
CO2: 21 mmol/L — ABNORMAL LOW (ref 22–32)
Calcium: 9.2 mg/dL (ref 8.9–10.3)
Chloride: 107 mmol/L (ref 98–111)
Creatinine, Ser: 1.7 mg/dL — ABNORMAL HIGH (ref 0.44–1.00)
GFR, Estimated: 31 mL/min — ABNORMAL LOW (ref >60)
Glucose, Bld: 97 mg/dL (ref 70–99)
Phosphorus: 2.5 mg/dL (ref 2.5–4.6)
Potassium: 3.7 mmol/L (ref 3.5–5.1)
Sodium: 139 mmol/L (ref 135–145)

## 2024-07-15 LAB — CBC
HCT: 36.6 % (ref 36.0–46.0)
Hemoglobin: 11.5 g/dL — ABNORMAL LOW (ref 12.0–15.0)
MCH: 27.7 pg (ref 26.0–34.0)
MCHC: 31.4 g/dL (ref 30.0–36.0)
MCV: 88.2 fL (ref 80.0–100.0)
Platelets: 230 K/uL (ref 150–400)
RBC: 4.15 MIL/uL (ref 3.87–5.11)
RDW: 14.6 % (ref 11.5–15.5)
WBC: 4.6 K/uL (ref 4.0–10.5)
nRBC: 0 % (ref 0.0–0.2)

## 2024-07-15 LAB — PROTEIN / CREATININE RATIO, URINE
Creatinine, Urine: 213 mg/dL
Protein Creatinine Ratio: 0.33 mg/mg{creat} — ABNORMAL HIGH (ref 0.00–0.15)
Total Protein, Urine: 70 mg/dL

## 2024-07-15 LAB — IRON AND TIBC
Iron: 68 ug/dL (ref 28–170)
Saturation Ratios: 28 % (ref 10.4–31.8)
TIBC: 240 ug/dL — ABNORMAL LOW (ref 250–450)
UIBC: 172 ug/dL

## 2024-07-15 LAB — TSH: TSH: 0.929 u[IU]/mL (ref 0.350–4.500)

## 2024-07-15 LAB — CORTISOL-AM, BLOOD: Cortisol - AM: 11.4 ug/dL (ref 6.7–22.6)

## 2024-07-15 LAB — T4, FREE: Free T4: 1.14 ng/dL — ABNORMAL HIGH (ref 0.61–1.12)

## 2024-07-15 LAB — FERRITIN: Ferritin: 78 ng/mL (ref 11–307)

## 2024-07-16 LAB — C4 COMPLEMENT: Complement C4, Body Fluid: 28 mg/dL (ref 12–38)

## 2024-07-16 LAB — PARATHYROID HORMONE, INTACT (NO CA): PTH: 55 pg/mL (ref 15–65)

## 2024-07-16 LAB — C3 COMPLEMENT: C3 Complement: 116 mg/dL (ref 82–167)

## 2024-07-16 LAB — ANA: Anti Nuclear Antibody (ANA): NEGATIVE

## 2024-07-17 LAB — MISC LABCORP TEST (SEND OUT): Labcorp test code: 163061

## 2024-07-17 LAB — COMPLEMENT, TOTAL: Compl, Total (CH50): >60 U/mL (ref >41)

## 2024-07-24 DIAGNOSIS — N179 Acute kidney failure, unspecified: Secondary | ICD-10-CM | POA: Diagnosis not present

## 2024-07-24 DIAGNOSIS — N1832 Chronic kidney disease, stage 3b: Secondary | ICD-10-CM | POA: Diagnosis not present

## 2024-07-24 DIAGNOSIS — E049 Nontoxic goiter, unspecified: Secondary | ICD-10-CM | POA: Diagnosis not present

## 2024-07-24 DIAGNOSIS — N049 Nephrotic syndrome with unspecified morphologic changes: Secondary | ICD-10-CM | POA: Diagnosis not present

## 2024-07-28 ENCOUNTER — Other Ambulatory Visit: Payer: Self-pay | Admitting: Family Medicine

## 2024-07-28 DIAGNOSIS — E049 Nontoxic goiter, unspecified: Secondary | ICD-10-CM

## 2024-07-29 ENCOUNTER — Telehealth: Payer: Self-pay

## 2024-07-29 NOTE — Telephone Encounter (Signed)
 I've called the patient to try and schedule her thyroid  ultrasound, she does not have voicemail available, will try calling back later .

## 2024-08-03 ENCOUNTER — Encounter (INDEPENDENT_AMBULATORY_CARE_PROVIDER_SITE_OTHER): Payer: Self-pay | Admitting: *Deleted

## 2024-08-05 ENCOUNTER — Ambulatory Visit (HOSPITAL_COMMUNITY): Admission: RE | Admit: 2024-08-05 | Source: Ambulatory Visit

## 2024-08-10 ENCOUNTER — Other Ambulatory Visit (HOSPITAL_COMMUNITY)
Admission: RE | Admit: 2024-08-10 | Discharge: 2024-08-10 | Disposition: A | Discharge status: Home / Self Care | Source: Ambulatory Visit | Attending: Nephrology | Admitting: Nephrology

## 2024-08-10 DIAGNOSIS — N189 Chronic kidney disease, unspecified: Secondary | ICD-10-CM | POA: Diagnosis not present

## 2024-08-10 LAB — RENAL FUNCTION PANEL
Albumin: 3.5 g/dL (ref 3.5–5.0)
Anion gap: 13 (ref 5–15)
BUN: 18 mg/dL (ref 8–23)
CO2: 21 mmol/L — ABNORMAL LOW (ref 22–32)
Calcium: 9.4 mg/dL (ref 8.9–10.3)
Chloride: 103 mmol/L (ref 98–111)
Creatinine, Ser: 1.87 mg/dL — ABNORMAL HIGH (ref 0.44–1.00)
GFR, Estimated: 27 mL/min — ABNORMAL LOW (ref >60)
Glucose, Bld: 107 mg/dL — ABNORMAL HIGH (ref 70–99)
Phosphorus: 2.5 mg/dL (ref 2.5–4.6)
Potassium: 4.4 mmol/L (ref 3.5–5.1)
Sodium: 137 mmol/L (ref 135–145)

## 2024-08-11 ENCOUNTER — Ambulatory Visit: Admitting: Cardiology

## 2024-08-11 NOTE — Progress Notes (Deleted)
 Clinical Summary Deborah Gomez is a 78 y.o.female  seen today for follow up of the following medical problems.    1.Chronic HFpEF  - 07/2021 echo: LVEF 65-70%, no WMAs, severe asymmetric LVH(on my measurement septum 1.5 cm and PW 1.3 cm), grade I DD. Normal RV, mild MR - infrequent edema.. No SOB/DOE   12/2022 echo: LVEF 65-70%, no WMAs, grade I dd, normal RV function   - no SOB/DOE. Some LE edema that is variable, overeall mild. Compliant with lasix .    2. CKD IV - followed by Dr Rachele     3. Aortic sclerosis - mild to mod sclerosis, no significant stenosis. Mean grad 6 mmHg.  12/2022 echo: aortic sclerosis without stenosis     4. HTN - bp with pcp this week 112/54 - compliant with meds   5. Hyperlipidemia - 10/2021 TC 177 TG 81 HDL 92 LDL 70 - 12/2022 TC 98 TG 96 HDL 44 LDL 35 - she is on crestor  40mg  daily.    6. SOB - admit 06/2024 with DOE - Ddimer elevated but negative VQ scan - no clear signs of volume overload. From notes thought to be COPD related - was started on breo   Past Medical History:  Diagnosis Date   Aortic valve sclerosis    Chronic kidney disease    CKD (chronic kidney disease), stage IV (HCC)    Diabetes mellitus (HCC)    Diastolic dysfunction    Hypertension    LVH (left ventricular hypertrophy)      No Known Allergies   Current Outpatient Medications  Medication Sig Dispense Refill   acetaminophen  (TYLENOL ) 325 MG tablet Take 2 tablets (650 mg total) by mouth every 6 (six) hours as needed for mild pain (pain score 1-3) or fever (or Fever >/= 101).     aspirin  EC 81 MG tablet Take 1 tablet (81 mg total) by mouth daily with breakfast. Swallow whole. 30 tablet 5   fluticasone  furoate-vilanterol (BREO ELLIPTA ) 100-25 MCG/ACT AEPB Inhale 1 puff into the lungs daily. 180 each 5   gabapentin  (NEURONTIN ) 100 MG capsule Take 1 capsule (100 mg total) by mouth 3 (three) times daily. 90 capsule 5   hydrALAZINE  (APRESOLINE ) 100 MG tablet Take  1 tablet (100 mg total) by mouth 3 (three) times daily. 90 tablet 5   NIFEdipine  (ADALAT  CC) 90 MG 24 hr tablet Take 1 tablet (90 mg total) by mouth daily. 30 tablet 5   No current facility-administered medications for this visit.     Past Surgical History:  Procedure Laterality Date   APPENDECTOMY     BREAST SURGERY Left    CARPAL TUNNEL RELEASE Right    CATARACT EXTRACTION W/PHACO Right 07/30/2022   Procedure: CATARACT EXTRACTION PHACO AND INTRAOCULAR LENS PLACEMENT (IOC);  Surgeon: Harrie Agent, MD;  Location: [facility];  Service: Ophthalmology;  Laterality: Right;  CDE:19.32   CATARACT EXTRACTION W/PHACO Left 08/27/2022   Procedure: CATARACT EXTRACTION PHACO AND INTRAOCULAR LENS PLACEMENT (IOC);  Surgeon: Harrie Agent, MD;  Location: [facility];  Service: Ophthalmology;  Laterality: Left;  CDE: 32.13   REPLACEMENT TOTAL KNEE Left    THYROID  SURGERY     partial removal     No Known Allergies    Family History  Adopted: Yes  Problem Relation Age of Onset   Colon cancer Neg Hx    Colon polyps Neg Hx      Social History Deborah Gomez reports that she has been smoking cigarettes.  She started smoking about 55 years ago. She has a 13 pack-year smoking history. She has been exposed to tobacco smoke. She has never used smokeless tobacco. Deborah Gomez reports that she does not currently use alcohol.   Review of Systems CONSTITUTIONAL: No weight loss, fever, chills, weakness or fatigue.  HEENT: Eyes: No visual loss, blurred vision, double vision or yellow sclerae.No hearing loss, sneezing, congestion, runny nose or sore throat.  SKIN: No rash or itching.  CARDIOVASCULAR:  RESPIRATORY: No shortness of breath, cough or sputum.  GASTROINTESTINAL: No anorexia, nausea, vomiting or diarrhea. No abdominal pain or blood.  GENITOURINARY: No burning on urination, no polyuria NEUROLOGICAL: No headache, dizziness, syncope, paralysis, ataxia, numbness or tingling in the extremities. No change in  bowel or bladder control.  MUSCULOSKELETAL: No muscle, back pain, joint pain or stiffness.  LYMPHATICS: No enlarged nodes. No history of splenectomy.  PSYCHIATRIC: No history of depression or anxiety.  ENDOCRINOLOGIC: No reports of sweating, cold or heat intolerance. No polyuria or polydipsia.  Deborah Gomez   Physical Examination There were no vitals filed for this visit. There were no vitals filed for this visit.  Gen: resting comfortably, no acute distress HEENT: no scleral icterus, pupils equal round and reactive, no palptable cervical adenopathy,  CV Resp: Clear to auscultation bilaterally GI: abdomen is soft, non-tender, non-distended, normal bowel sounds, no hepatosplenomegaly MSK: extremities are warm, no edema.  Skin: warm, no rash Neuro:  no focal deficits Psych: appropriate affect   Diagnostic Studies  07/2021 echo IMPRESSIONS     1. Left ventricular ejection fraction, by estimation, is 65 to 70%. The  left ventricle has normal function. The left ventricle has no regional  wall motion abnormalities. There is severe asymmetric left ventricular  hypertrophy of the septal segment. Left   ventricular diastolic parameters are consistent with Grade I diastolic  dysfunction (impaired relaxation).   2. Right ventricular systolic function is normal. The right ventricular  size is normal. There is normal pulmonary artery systolic pressure. The  estimated right ventricular systolic pressure is 21.7 mmHg.   3. A small pericardial effusion is present. The pericardial effusion is  posterior to the left ventricle.   4. The mitral valve is grossly normal. Mild mitral valve regurgitation.   5. The aortic valve is tricuspid. There is moderate calcification of the  aortic valve. Aortic valve regurgitation is not visualized. Mild to  moderate aortic valve sclerosis/calcification is present, without any  evidence of aortic stenosis.   6. The inferior vena cava is normal in size with greater  than 50%  respiratory variability, suggesting right atrial pressure of 3 mmHg.    12/2022 echo IMPRESSIONS     1. Left ventricular ejection fraction, by estimation, is 65 to 70%. The  left ventricle has normal function. The left ventricle has no regional  wall motion abnormalities. There is moderate left ventricular hypertrophy.  Left ventricular diastolic  parameters are consistent with Grade I diastolic dysfunction (impaired  relaxation).   2. Right ventricular systolic function is normal. The right ventricular  size is normal. There is normal pulmonary artery systolic pressure.   3. The mitral valve is normal in structure. No evidence of mitral valve  regurgitation. No evidence of mitral stenosis.   4. The tricuspid valve is abnormal.   5. The aortic valve is tricuspid. There is mild calcification of the  aortic valve. There is mild thickening of the aortic valve. Aortic valve  regurgitation is not visualized. No aortic stenosis is present.  6. The inferior vena cava is normal in size with greater than 50%  respiratory variability, suggesting right atrial pressure of 3 mmHg.    Assessment and Plan  Chronic HFpEF - volume status controlled with lasix , continue current meds   2.HLD - she is at goal, continue current meds   3. HTN - bp elevated here but well controlled at recent pcp visit - monitor bp's and submit bp log when she comes for carotid US    4. Carotid bruit - obtain carotid US       Dorn PHEBE Ross, M.D.

## 2024-08-17 DIAGNOSIS — N2581 Secondary hyperparathyroidism of renal origin: Secondary | ICD-10-CM | POA: Diagnosis not present

## 2024-08-17 DIAGNOSIS — I5032 Chronic diastolic (congestive) heart failure: Secondary | ICD-10-CM | POA: Diagnosis not present

## 2024-08-17 DIAGNOSIS — N184 Chronic kidney disease, stage 4 (severe): Secondary | ICD-10-CM | POA: Diagnosis not present

## 2024-08-17 DIAGNOSIS — N049 Nephrotic syndrome with unspecified morphologic changes: Secondary | ICD-10-CM | POA: Diagnosis not present

## 2024-08-18 ENCOUNTER — Ambulatory Visit: Admitting: Cardiology

## 2024-09-01 ENCOUNTER — Encounter: Payer: Self-pay | Admitting: Family Medicine

## 2024-09-01 ENCOUNTER — Ambulatory Visit: Admitting: Family Medicine

## 2024-09-01 VITALS — BP 117/69 | HR 92 | Ht 64.0 in | Wt 128.0 lb

## 2024-09-01 DIAGNOSIS — G629 Polyneuropathy, unspecified: Secondary | ICD-10-CM | POA: Diagnosis not present

## 2024-09-01 DIAGNOSIS — I10 Essential (primary) hypertension: Secondary | ICD-10-CM | POA: Diagnosis not present

## 2024-09-01 DIAGNOSIS — E782 Mixed hyperlipidemia: Secondary | ICD-10-CM | POA: Diagnosis not present

## 2024-09-01 DIAGNOSIS — N184 Chronic kidney disease, stage 4 (severe): Secondary | ICD-10-CM | POA: Diagnosis not present

## 2024-09-01 MED ORDER — HYDRALAZINE HCL 100 MG PO TABS: 100.0000 mg | ORAL_TABLET | Freq: Three times a day (TID) | ORAL | 3 refills | Status: AC

## 2024-09-01 MED ORDER — GABAPENTIN 100 MG PO CAPS: 100.0000 mg | ORAL_CAPSULE | Freq: Three times a day (TID) | ORAL | 3 refills | Status: AC

## 2024-09-01 MED ORDER — NIFEDIPINE ER 90 MG PO TB24: 90.0000 mg | ORAL_TABLET | Freq: Every day | ORAL | 3 refills | Status: AC

## 2024-09-01 NOTE — Assessment & Plan Note (Signed)
Lipids at goal

## 2024-09-01 NOTE — Assessment & Plan Note (Signed)
 Continue close follow up with Nephrology.

## 2024-09-01 NOTE — Patient Instructions (Signed)
Follow up in 6 months.  Take care  Dr. Wren Gallaga  

## 2024-09-01 NOTE — Assessment & Plan Note (Signed)
Stable.  Continue Gabapentin 

## 2024-09-01 NOTE — Assessment & Plan Note (Signed)
Stable.  Meds refilled. 

## 2024-09-01 NOTE — Progress Notes (Signed)
 Subjective:  Patient ID: Deborah Gomez, female    DOB: 1946-11-10  Age: 78 y.o. MRN: [id number]  CC:   Chief Complaint  Patient presents with   Hypertension    Six month follow up     HPI:  78 year old female presents for follow up.  Continues to smoke. Not interested in cessation.  BP stable on Hydralazine  and Nifedipine .  Continues to follow with [facility] regarding CKD.  Denies chest pain, SOB. Feeling well. Declines vaccines.   Patient Active Problem List   Diagnosis Date Noted   Neuropathy 01/28/2024   Bronchiectasis without complication (HCC) 02/06/2023   Aortic valve sclerosis 04/11/2022   Diastolic dysfunction 04/11/2022   CKD (chronic kidney disease) stage 4, GFR 15-29 ml/min (HCC) 01/09/2022   Anemia of chronic disease 01/09/2022   Goiter 01/09/2022   Positive colorectal cancer screening using Cologuard test 12/19/2021   HLD (hyperlipidemia) 08/16/2021   Vitamin D  deficiency 06/06/2021   Tobacco abuse 05/16/2021   Hypertension 05/15/2021    Social Hx   Social History   Socioeconomic History   Marital status: Significant Other    Spouse name: Not on file   Number of children: 2   Years of education: Not on file   Highest education level: Not on file  Occupational History   Occupation: Retired    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  Tobacco Use   Smoking status: Every Day    Current packs/day: 0.00    Average packs/day: 0.3 packs/day for 52.0 years (13.0 ttl pk-yrs)    Types: Cigarettes    Start date: 08/21/1969    Last attempt to quit: 08/21/2021    Years since quitting: 3.0    Passive exposure: Current   Smokeless tobacco: Never   Tobacco comments:    Smokes about 4 cigarettes per day   Vaping Use   Vaping status: Never Used  Substance and Sexual Activity   Alcohol use: Not Currently   Drug use: Never   Sexual activity: Yes    Birth control/protection: Post-menopausal  Other Topics Concern   Not on file  Social  History Narrative   1 child in [place], the other is not nearby   Social Drivers of Corporate investment banker Strain: Low Risk  (10/11/2023)   Overall Financial Resource Strain (CARDIA)    Difficulty of Paying Living Expenses: Not hard at all  Food Insecurity: No Food Insecurity (06/25/2024)   Hunger Vital Sign    Worried About Running Out of Food in the Last Year: Never true    Ran Out of Food in the Last Year: Never true  Transportation Needs: No Transportation Needs (06/25/2024)   PRAPARE - Administrator, Civil Service (Medical): No    Lack of Transportation (Non-Medical): No  Physical Activity: Insufficiently Active (10/11/2023)   Exercise Vital Sign    Days of Exercise per Week: 3 days    Minutes of Exercise per Session: 30 min  Stress: No Stress Concern Present (10/11/2023)   [employer] of Occupational Health - Occupational Stress Questionnaire    Feeling of Stress : Not at all  Social Connections: Socially Isolated (06/25/2024)   Social Connection and Isolation Panel    Frequency of Communication with Friends and Family: Three times a week    Frequency of Social Gatherings with Friends and Family: Once a week    Attends Religious Services: Never    Database administrator or Organizations: No  Attends Banker Meetings: Never    Marital Status: Widowed    Review of Systems Per HPI  Objective:  BP 117/69   Pulse 92   Ht 5' 4 (1.626 m)   Wt 128 lb (58.1 kg)   SpO2 98%   BMI 21.97 kg/m      09/01/2024   10:32 AM 06/26/2024    3:06 PM 06/26/2024    8:28 AM  BP/Weight  Systolic BP 117 156 126  Diastolic BP 69 69 70  Wt. (Lbs) 128    BMI 21.97 kg/m2      Physical Exam Vitals and nursing note reviewed.  Constitutional:      General: She is not in acute distress.    Appearance: Normal appearance. She is not diaphoretic.  HENT:     Head: Normocephalic and atraumatic.  Cardiovascular:     Rate and Rhythm: Normal rate and regular  rhythm.  Pulmonary:     Effort: Pulmonary effort is normal.     Breath sounds: Normal breath sounds. No wheezing or rales.  Neurological:     Mental Status: She is alert.  Psychiatric:        Mood and Affect: Mood normal.        Behavior: Behavior normal.     Lab Results  Component Value Date   WBC 4.6 07/15/2024   HGB 11.5 (L) 07/15/2024   HCT 36.6 07/15/2024   PLT 230 07/15/2024   GLUCOSE 107 (H) 08/10/2024   CHOL 98 01/09/2023   TRIG 96 01/09/2023   HDL 44 01/09/2023   LDLCALC 35 01/09/2023   ALT 14 06/25/2024   AST 26 06/25/2024   NA 137 08/10/2024   K 4.4 08/10/2024   CL 103 08/10/2024   CREATININE 1.87 (H) 08/10/2024   BUN 18 08/10/2024   CO2 21 (L) 08/10/2024   TSH 0.929 07/15/2024   HGBA1C 5.9 (H) 01/09/2023   MICROALBUR 236.0 (H) 01/09/2023     Assessment & Plan:  Hypertension, unspecified type Assessment & Plan: Stable. Meds refilled.  Orders: -     NIFEdipine  ER; Take 1 tablet (90 mg total) by mouth daily.  Dispense: 90 tablet; Refill: 3 -     hydrALAZINE  HCl; Take 1 tablet (100 mg total) by mouth 3 (three) times daily.  Dispense: 270 tablet; Refill: 3  Neuropathy Assessment & Plan: Stable. Continue Gabapentin .  Orders: -     Gabapentin ; Take 1 capsule (100 mg total) by mouth 3 (three) times daily.  Dispense: 270 capsule; Refill: 3  CKD (chronic kidney disease) stage 4, GFR 15-29 ml/min (HCC) Assessment & Plan: Continue close follow up with Nephrology.   Mixed hyperlipidemia Assessment & Plan: Lipids at goal.     Follow-up:  6 months  Mandy Peeks Bluford DO [facility]

## 2024-09-04 ENCOUNTER — Telehealth: Payer: Self-pay | Admitting: Family Medicine

## 2024-09-04 NOTE — Telephone Encounter (Signed)
 Patient is requesting prescription for  losartan  50mg   [facility]

## 2024-09-07 ENCOUNTER — Other Ambulatory Visit: Payer: Self-pay | Admitting: Family Medicine

## 2024-09-07 NOTE — Telephone Encounter (Signed)
 Cook, Jayce G, DO     09/07/24 12:21 PM Not on med list. Please clarify with patient

## 2024-09-11 NOTE — Telephone Encounter (Signed)
 Patient said she was taking it

## 2024-09-12 ENCOUNTER — Other Ambulatory Visit: Payer: Self-pay | Admitting: Family Medicine

## 2024-09-12 MED ORDER — LOSARTAN POTASSIUM 50 MG PO TABS: 50.0000 mg | ORAL_TABLET | Freq: Every day | ORAL | 3 refills | Status: DC

## 2024-09-23 NOTE — Telephone Encounter (Signed)
 Cook, Jayce G, DO      09/12/24  9:21 AM Rx sent in.

## 2024-10-23 ENCOUNTER — Ambulatory Visit

## 2024-10-23 ENCOUNTER — Ambulatory Visit: Attending: Cardiology | Admitting: Cardiology

## 2024-10-23 ENCOUNTER — Encounter: Payer: Self-pay | Admitting: Cardiology

## 2024-10-23 VITALS — BP 128/48 | HR 104 | Ht 64.0 in | Wt 126.0 lb

## 2024-10-23 DIAGNOSIS — I5032 Chronic diastolic (congestive) heart failure: Secondary | ICD-10-CM | POA: Diagnosis not present

## 2024-10-23 DIAGNOSIS — I10 Essential (primary) hypertension: Secondary | ICD-10-CM

## 2024-10-23 DIAGNOSIS — E782 Mixed hyperlipidemia: Secondary | ICD-10-CM

## 2024-10-23 NOTE — Progress Notes (Signed)
 Clinical Summary Deborah Gomez is a 78 y.o.female seen today for follow up of the following medical problems.    1.Chronic HFpEF  - 07/2021 echo: LVEF 65-70%, no WMAs, severe asymmetric LVH(on my measurement septum 1.5 cm and PW 1.3 cm), grade I DD. Normal RV, mild MR 12/2022 echo: LVEF 65-70%, no WMAs, grade I dd, normal RV function   - no recent edema. No longer requiring diuretic.  -weight down 22 lbs, reports decreased appetite. No SOB/DOE   2. CKD IV - followed by Dr Rachele     3. Aortic sclerosis - mild to mod sclerosis, no significant stenosis. Mean grad 6 mmHg.  12/2022 echo: aortic sclerosis without stenosis     4. HTN - she is compliant with meds   5. Hyperlipidemia - 10/2021 TC 177 TG 81 HDL 92 LDL 70 - 12/2022 TC 98 TG 96 HDL 44 LDL 35 - she is on crestor  40mg  daily.  - compliatnw with meds    4. SOB - admit 06/2024, from notes suspected COPD and started on breo - no recurrence of symptoms, no longer using breo  Past Medical History:  Diagnosis Date   Aortic valve sclerosis    Chronic kidney disease    CKD (chronic kidney disease), stage IV (HCC)    Diabetes mellitus (HCC)    Diastolic dysfunction    Hypertension    LVH (left ventricular hypertrophy)      No Known Allergies   Current Outpatient Medications  Medication Sig Dispense Refill   acetaminophen  (TYLENOL ) 325 MG tablet Take 2 tablets (650 mg total) by mouth every 6 (six) hours as needed for mild pain (pain score 1-3) or fever (or Fever >/= 101).     aspirin  EC 81 MG tablet Take 1 tablet (81 mg total) by mouth daily with breakfast. Swallow whole. 30 tablet 5   fluticasone  furoate-vilanterol (BREO ELLIPTA ) 100-25 MCG/ACT AEPB Inhale 1 puff into the lungs daily. 180 each 5   gabapentin  (NEURONTIN ) 100 MG capsule Take 1 capsule (100 mg total) by mouth 3 (three) times daily. 270 capsule 3   hydrALAZINE  (APRESOLINE ) 100 MG tablet Take 1 tablet (100 mg total) by mouth 3 (three) times daily. 270  tablet 3   losartan  (COZAAR ) 50 MG tablet Take 1 tablet (50 mg total) by mouth daily. 90 tablet 3   NIFEdipine  (ADALAT  CC) 90 MG 24 hr tablet Take 1 tablet (90 mg total) by mouth daily. 90 tablet 3   No current facility-administered medications for this visit.     Past Surgical History:  Procedure Laterality Date   APPENDECTOMY     BREAST SURGERY Left    CARPAL TUNNEL RELEASE Right    CATARACT EXTRACTION W/PHACO Right 07/30/2022   Procedure: CATARACT EXTRACTION PHACO AND INTRAOCULAR LENS PLACEMENT (IOC);  Surgeon: Harrie Agent, MD;  Location: [facility];  Service: Ophthalmology;  Laterality: Right;  CDE:19.32   CATARACT EXTRACTION W/PHACO Left 08/27/2022   Procedure: CATARACT EXTRACTION PHACO AND INTRAOCULAR LENS PLACEMENT (IOC);  Surgeon: Harrie Agent, MD;  Location: [facility];  Service: Ophthalmology;  Laterality: Left;  CDE: 32.13   REPLACEMENT TOTAL KNEE Left    THYROID  SURGERY     partial removal     No Known Allergies    Family History  Adopted: Yes  Problem Relation Age of Onset   Colon cancer Neg Hx    Colon polyps Neg Hx      Social History Deborah Gomez reports that she has been  smoking cigarettes. She started smoking about 55 years ago. She has a 13 pack-year smoking history. She has been exposed to tobacco smoke. She has never used smokeless tobacco. Deborah Gomez reports that she does not currently use alcohol.    Physical Examination Today's Vitals   10/23/24 0921  BP: (!) 128/48  Pulse: (!) 104  SpO2: 97%  Weight: 126 lb (57.2 kg)  Height: 5' 4 (1.626 m)   Body mass index is 21.63 kg/m.  Gen: resting comfortably, no acute distress HEENT: no scleral icterus, pupils equal round and reactive, no palptable cervical adenopathy,  CV: RRR, no m/rg, no jvd Resp: Clear to auscultation bilaterally GI: abdomen is soft, non-tender, non-distended, normal bowel sounds, no hepatosplenomegaly MSK: extremities are warm, no edema.  Skin: warm, no rash Neuro:  no focal  deficits Psych: appropriate affect   Diagnostic Studies  07/2021 echo IMPRESSIONS     1. Left ventricular ejection fraction, by estimation, is 65 to 70%. The  left ventricle has normal function. The left ventricle has no regional  wall motion abnormalities. There is severe asymmetric left ventricular  hypertrophy of the septal segment. Left   ventricular diastolic parameters are consistent with Grade I diastolic  dysfunction (impaired relaxation).   2. Right ventricular systolic function is normal. The right ventricular  size is normal. There is normal pulmonary artery systolic pressure. The  estimated right ventricular systolic pressure is 21.7 mmHg.   3. A small pericardial effusion is present. The pericardial effusion is  posterior to the left ventricle.   4. The mitral valve is grossly normal. Mild mitral valve regurgitation.   5. The aortic valve is tricuspid. There is moderate calcification of the  aortic valve. Aortic valve regurgitation is not visualized. Mild to  moderate aortic valve sclerosis/calcification is present, without any  evidence of aortic stenosis.   6. The inferior vena cava is normal in size with greater than 50%  respiratory variability, suggesting right atrial pressure of 3 mmHg.    12/2022 echo IMPRESSIONS     1. Left ventricular ejection fraction, by estimation, is 65 to 70%. The  left ventricle has normal function. The left ventricle has no regional  wall motion abnormalities. There is moderate left ventricular hypertrophy.  Left ventricular diastolic  parameters are consistent with Grade I diastolic dysfunction (impaired  relaxation).   2. Right ventricular systolic function is normal. The right ventricular  size is normal. There is normal pulmonary artery systolic pressure.   3. The mitral valve is normal in structure. No evidence of mitral valve  regurgitation. No evidence of mitral stenosis.   4. The tricuspid valve is abnormal.   5. The  aortic valve is tricuspid. There is mild calcification of the  aortic valve. There is mild thickening of the aortic valve. Aortic valve  regurgitation is not visualized. No aortic stenosis is present.   6. The inferior vena cava is normal in size with greater than 50%  respiratory variability, suggesting right atrial pressure of 3 mmHg.      Assessment and Plan   Chronic HFpEF - euvolemic, no longer requiring diuretic - continue to monitor   2.HLD - has been at goal, continue to follow labs   3. HTN - at goal, continue current meds      Dorn PHEBE Ross, M.D.

## 2024-10-23 NOTE — Patient Instructions (Signed)
 Medication Instructions:   STOP Losartan    Labwork: None today  Testing/Procedures: None today  Follow-Up: 6 months Dr.Branch  Any Other Special Instructions Will Be Listed Below (If Applicable).  If you need a refill on your cardiac medications before your next appointment, please call your pharmacy.

## 2024-10-27 ENCOUNTER — Other Ambulatory Visit (HOSPITAL_COMMUNITY)
Admission: RE | Admit: 2024-10-27 | Discharge: 2024-10-27 | Disposition: A | Source: Ambulatory Visit | Attending: Nephrology | Admitting: Nephrology

## 2024-10-27 DIAGNOSIS — N189 Chronic kidney disease, unspecified: Secondary | ICD-10-CM | POA: Diagnosis not present

## 2024-10-27 LAB — RENAL FUNCTION PANEL
Albumin: 4.4 g/dL (ref 3.5–5.0)
Anion gap: 19 — ABNORMAL HIGH (ref 5–15)
BUN: 24 mg/dL — ABNORMAL HIGH (ref 8–23)
CO2: 20 mmol/L — ABNORMAL LOW (ref 22–32)
Calcium: 9.7 mg/dL (ref 8.9–10.3)
Chloride: 101 mmol/L (ref 98–111)
Creatinine, Ser: 2.59 mg/dL — ABNORMAL HIGH (ref 0.44–1.00)
GFR, Estimated: 18 mL/min — ABNORMAL LOW (ref >60)
Glucose, Bld: 105 mg/dL — ABNORMAL HIGH (ref 70–99)
Phosphorus: 2.9 mg/dL (ref 2.5–4.6)
Potassium: 4 mmol/L (ref 3.5–5.1)
Sodium: 140 mmol/L (ref 135–145)

## 2024-10-27 LAB — CBC
HCT: 36.3 % (ref 36.0–46.0)
Hemoglobin: 11.1 g/dL — ABNORMAL LOW (ref 12.0–15.0)
MCH: 27.6 pg (ref 26.0–34.0)
MCHC: 30.6 g/dL (ref 30.0–36.0)
MCV: 90.3 fL (ref 80.0–100.0)
Platelets: 231 K/uL (ref 150–400)
RBC: 4.02 MIL/uL (ref 3.87–5.11)
RDW: 14.5 % (ref 11.5–15.5)
WBC: 9.2 K/uL (ref 4.0–10.5)
nRBC: 0 % (ref 0.0–0.2)

## 2024-10-28 LAB — PARATHYROID HORMONE, INTACT (NO CA): PTH: 43 pg/mL (ref 15–65)

## 2024-10-28 LAB — MICROALBUMIN / CREATININE URINE RATIO
Creatinine, Urine: 81.5 mg/dL
Microalb Creat Ratio: 94 mg/g{creat} — ABNORMAL HIGH (ref 0–29)
Microalb, Ur: 77 ug/mL — ABNORMAL HIGH

## 2024-11-03 ENCOUNTER — Other Ambulatory Visit (HOSPITAL_COMMUNITY)
Admission: RE | Admit: 2024-11-03 | Discharge: 2024-11-03 | Disposition: A | Source: Ambulatory Visit | Attending: Nephrology | Admitting: Nephrology

## 2024-11-03 DIAGNOSIS — R809 Proteinuria, unspecified: Secondary | ICD-10-CM | POA: Diagnosis not present

## 2024-11-03 DIAGNOSIS — D631 Anemia in chronic kidney disease: Secondary | ICD-10-CM | POA: Diagnosis present

## 2024-11-03 DIAGNOSIS — N185 Chronic kidney disease, stage 5: Secondary | ICD-10-CM | POA: Diagnosis not present

## 2024-11-03 LAB — RENAL FUNCTION PANEL
Albumin: 4.3 g/dL (ref 3.5–5.0)
Anion gap: 14 (ref 5–15)
BUN: 19 mg/dL (ref 8–23)
CO2: 21 mmol/L — ABNORMAL LOW (ref 22–32)
Calcium: 9.5 mg/dL (ref 8.9–10.3)
Chloride: 105 mmol/L (ref 98–111)
Creatinine, Ser: 2.16 mg/dL — ABNORMAL HIGH (ref 0.44–1.00)
GFR, Estimated: 23 mL/min — ABNORMAL LOW (ref >60)
Glucose, Bld: 81 mg/dL (ref 70–99)
Phosphorus: 2.5 mg/dL (ref 2.5–4.6)
Potassium: 3.6 mmol/L (ref 3.5–5.1)
Sodium: 140 mmol/L (ref 135–145)

## 2024-11-03 LAB — HEPATITIS B SURFACE ANTIGEN: Hepatitis B Surface Ag: NONREACTIVE

## 2024-11-03 LAB — URINALYSIS, W/ REFLEX TO CULTURE (INFECTION SUSPECTED)
Bilirubin Urine: NEGATIVE
Glucose, UA: NEGATIVE mg/dL
Hgb urine dipstick: NEGATIVE
Ketones, ur: NEGATIVE mg/dL
Leukocytes,Ua: NEGATIVE
Nitrite: NEGATIVE
Protein, ur: 30 mg/dL — AB
Specific Gravity, Urine: 1.01 (ref 1.005–1.030)
pH: 5 (ref 5.0–8.0)

## 2024-11-03 LAB — HEPATITIS B SURFACE ANTIBODY,QUALITATIVE: Hep B S Ab: NONREACTIVE

## 2024-11-03 LAB — HEPATITIS B CORE ANTIBODY, IGM: Hep B C IgM: NONREACTIVE

## 2024-11-04 LAB — HCV AB W REFLEX TO QUANT PCR: HCV Ab: NONREACTIVE

## 2024-11-04 LAB — HEPATITIS B CORE ANTIBODY, TOTAL: HEP B CORE AB: NEGATIVE

## 2024-11-04 LAB — HCV INTERPRETATION

## 2024-11-05 LAB — QUANTIFERON-TB GOLD PLUS (RQFGPL)
QuantiFERON Mitogen Value: >10 [IU]/mL
QuantiFERON Nil Value: 0.03 [IU]/mL
QuantiFERON TB1 Ag Value: 0.03 [IU]/mL
QuantiFERON TB2 Ag Value: 0.04 [IU]/mL

## 2024-11-05 LAB — QUANTIFERON-TB GOLD PLUS: QuantiFERON-TB Gold Plus: NEGATIVE

## 2024-11-10 ENCOUNTER — Ambulatory Visit: Admitting: Family Medicine

## 2024-11-10 VITALS — BP 146/67 | HR 107 | Temp 98.2°F | Ht 64.0 in | Wt 120.5 lb

## 2024-11-10 DIAGNOSIS — Z720 Tobacco use: Secondary | ICD-10-CM

## 2024-11-10 DIAGNOSIS — I10 Essential (primary) hypertension: Secondary | ICD-10-CM | POA: Diagnosis not present

## 2024-11-10 DIAGNOSIS — R195 Other fecal abnormalities: Secondary | ICD-10-CM | POA: Diagnosis not present

## 2024-11-10 DIAGNOSIS — R634 Abnormal weight loss: Secondary | ICD-10-CM

## 2024-11-10 DIAGNOSIS — N184 Chronic kidney disease, stage 4 (severe): Secondary | ICD-10-CM | POA: Diagnosis not present

## 2024-11-10 NOTE — Patient Instructions (Signed)
 Working on scans and colonoscopy.  We will be in touch  Dr. Bluford

## 2024-11-11 ENCOUNTER — Other Ambulatory Visit: Payer: Self-pay | Admitting: *Deleted

## 2024-11-11 ENCOUNTER — Encounter: Payer: Self-pay | Admitting: *Deleted

## 2024-11-11 ENCOUNTER — Telehealth: Payer: Self-pay | Admitting: *Deleted

## 2024-11-11 MED ORDER — NA SULFATE-K SULFATE-MG SULF 17.5-3.13-1.6 GM/177ML PO SOLN: ORAL | 0 refills | Status: AC

## 2024-11-11 NOTE — Telephone Encounter (Signed)
 Attempted to contact pt, no answer and vm not set up. Attempted to contact [name] (on dpr) vm not set up    Pt's PCP wants pt to have colonoscopy ASAP. He had sent message to Dr.Carver regarding pt. Message to Dr. Cindie from [facility]:. any way I can get you to scope this lady urgently. Unintentional weight loss. + cologuard previously and she refused colonoscopy. Now having weight loss again and is okay with pursuing colonoscopy. In the meantime, trying to get CT chest/abd/pelvis  Per Dr,Carver: Deborah Gomez to schedule Deborah Gomez thank you

## 2024-11-11 NOTE — Telephone Encounter (Signed)
Attempted to contact pt, no answer and vm not set up.

## 2024-11-11 NOTE — Telephone Encounter (Signed)
 Spoke to [place] (pt's daughter on dpr) and pt has been scheduled for Monday 11/16/24, instructions sent to daughter and prep sent to pharmacy. Endo made aware that pt has been added for Monday.

## 2024-11-11 NOTE — Telephone Encounter (Signed)
 Attempted to contact [name] (on dpr) vm not set up

## 2024-11-15 ENCOUNTER — Other Ambulatory Visit: Payer: Self-pay | Admitting: Internal Medicine

## 2024-11-16 ENCOUNTER — Encounter (HOSPITAL_COMMUNITY): Admission: RE | Payer: Self-pay | Source: Home / Self Care

## 2024-11-16 ENCOUNTER — Encounter (HOSPITAL_COMMUNITY): Payer: Self-pay | Admitting: Anesthesiology

## 2024-11-16 ENCOUNTER — Ambulatory Visit (HOSPITAL_COMMUNITY): Admission: RE | Admit: 2024-11-16 | Source: Home / Self Care | Admitting: Internal Medicine

## 2024-11-16 DIAGNOSIS — R634 Abnormal weight loss: Secondary | ICD-10-CM | POA: Insufficient documentation

## 2024-11-16 SURGERY — COLONOSCOPY
Anesthesia: Choice

## 2024-11-16 NOTE — Telephone Encounter (Signed)
 Pt's daughter called and stated that pt would not be able to make the procedure today because she wants to talk with pt's kidney doctor to make sure it's ok for her go through the procedure. She will call back to reschedule if ok with pt's kidney doctor.

## 2024-11-16 NOTE — Assessment & Plan Note (Signed)
 Stable

## 2024-11-16 NOTE — Assessment & Plan Note (Signed)
Fair control given advanced age.  Continue current medications.

## 2024-11-16 NOTE — Assessment & Plan Note (Signed)
 Etiology and prognosis unclear at this time.  Concern that this is secondary to malignancy.  Referring to GI for colonoscopy urgently.  Obtaining CT chest abdomen pelvis.

## 2024-11-16 NOTE — Progress Notes (Signed)
 "  Subjective:  Patient ID: Deborah Gomez, female    DOB: 1946/01/17  Age: 78 y.o. MRN: [id number]  CC:   Chief Complaint  Patient presents with   Weight Loss    Patient is here for experiencing rapid weight loss    HPI:  78 year old female smoker with CKD stage IV, hypertension, bronchiectasis, goiter, anemia of chronic disease, hyperlipidemia presents for evaluation of the above.  Patient has had a previous bout of weight loss which improved.  She has been losing weight again.  She was sent over from her nephrologist.  Reports a decrease in appetite.  Has lost 21 pounds since August.  Of note, she has had a previous positive Cologuard test and has refused colonoscopy.  We will revisit this today.  She has had prior negative CT imaging of the abdomen.  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/11/2022   Diastolic dysfunction 04/11/2022   CKD (chronic kidney disease) stage 4, GFR 15-29 ml/min (HCC) 01/09/2022   Anemia of chronic disease 01/09/2022   Goiter 01/09/2022   Positive colorectal cancer screening using Cologuard test 12/19/2021   HLD (hyperlipidemia) 08/16/2021   Vitamin D  deficiency 06/06/2021   Tobacco abuse 05/16/2021   Hypertension 05/15/2021    Social Hx   Social History   Socioeconomic History   Marital status: Significant Other    Spouse name: Not on file   Number of children: 2   Years of education: Not on file   Highest education level: Not on file  Occupational History   Occupation: Retired    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  Tobacco Use   Smoking status: Every Day    Current packs/day: 0.00    Average packs/day: 0.3 packs/day for 52.0 years (13.0 ttl pk-yrs)    Types: Cigarettes    Start date: 08/21/1969    Last attempt to quit: 08/21/2021    Years since quitting:  3.2    Passive exposure: Current   Smokeless tobacco: Never   Tobacco comments:    Smokes about 4 cigarettes per day   Vaping Use   Vaping status: Never Used  Substance and Sexual Activity   Alcohol use: Not Currently   Drug use: Never   Sexual activity: Yes    Birth control/protection: Post-menopausal  Other Topics Concern   Not on file  Social History Narrative   1 child in [place], the other is not nearby   Social Drivers of Health   Tobacco Use: High Risk (09/01/2024)   Patient History    Smoking Tobacco Use: Every Day    Smokeless Tobacco Use: Never    Passive Exposure: Current  Financial Resource Strain: Low Risk (10/11/2023)   Overall Financial Resource Strain (CARDIA)    Difficulty of Paying Living Expenses: Not hard at all  Food Insecurity: No Food Insecurity (06/25/2024)   Epic    Worried About Radiation Protection Practitioner of Food in the Last Year: Never true    Ran Out of Food in the Last Year: Never true  Transportation Needs: No Transportation Needs (06/25/2024)   Epic    Lack of Transportation (Medical): No    Lack of Transportation (Non-Medical): No  Physical Activity: Insufficiently Active (10/11/2023)   Exercise Vital Sign    Days of Exercise per Week: 3 days    Minutes of Exercise per Session: 30  min  Stress: No Stress Concern Present (10/11/2023)   [employer] of Occupational Health - Occupational Stress Questionnaire    Feeling of Stress : Not at all  Social Connections: Socially Isolated (06/25/2024)   Social Connection and Isolation Panel    Frequency of Communication with Friends and Family: Three times a week    Frequency of Social Gatherings with Friends and Family: Once a week    Attends Religious Services: Never    Database Administrator or Organizations: No    Attends Banker Meetings: Never    Marital Status: Widowed  Depression (PHQ2-9): Low Risk (09/01/2024)   Depression (PHQ2-9)    PHQ-2 Score: 0  Alcohol Screen: Low Risk (10/11/2023)    Alcohol Screen    Last Alcohol Screening Score (AUDIT): 0  Housing: Low Risk (06/25/2024)   Epic    Unable to Pay for Housing in the Last Year: No    Number of Times Moved in the Last Year: 0    Homeless in the Last Year: No  Utilities: Not At Risk (06/25/2024)   Epic    Threatened with loss of utilities: No  Health Literacy: Adequate Health Literacy (10/11/2023)   B1300 Health Literacy    Frequency of need for help with medical instructions: Never    Review of Systems  Constitutional:  Positive for appetite change and unexpected weight change. Negative for fever.    Objective:  BP (!) 146/67 (BP Location: Left Arm, Patient Position: Sitting)   Pulse (!) 107   Temp 98.2 F (36.8 C)   Ht 5' 4 (1.626 m)   Wt 120 lb 8 oz (54.7 kg)   SpO2 97%   BMI 20.68 kg/m      11/10/2024    4:16 PM 10/23/2024    9:21 AM 09/01/2024   10:32 AM  BP/Weight  Systolic BP 146 128 117  Diastolic BP 67 48 69  Wt. (Lbs) 120.5 126 128  BMI 20.68 kg/m2 21.63 kg/m2 21.97 kg/m2    Physical Exam Vitals and nursing note reviewed.  Constitutional:      General: She is not in acute distress.    Appearance: Normal appearance.  Cardiovascular:     Rate and Rhythm: Regular rhythm. Tachycardia present.  Pulmonary:     Effort: Pulmonary effort is normal.     Breath sounds: No wheezing or rales.  Abdominal:     General: There is no distension.     Palpations: Abdomen is soft.     Tenderness: There is no abdominal tenderness.  Neurological:     Mental Status: She is alert.  Psychiatric:        Mood and Affect: Mood normal.        Behavior: Behavior normal.     Lab Results  Component Value Date   WBC 9.2 10/27/2024   HGB 11.1 (L) 10/27/2024   HCT 36.3 10/27/2024   PLT 231 10/27/2024   GLUCOSE 81 11/03/2024   CHOL 98 01/09/2023   TRIG 96 01/09/2023   HDL 44 01/09/2023   LDLCALC 35 01/09/2023   ALT 14 06/25/2024   AST 26 06/25/2024   NA 140 11/03/2024   K 3.6 11/03/2024   CL 105  11/03/2024   CREATININE 2.16 (H) 11/03/2024   BUN 19 11/03/2024   CO2 21 (L) 11/03/2024   TSH 0.929 07/15/2024   HGBA1C 5.9 (H) 01/09/2023   MICROALBUR 77.0 (H) 10/27/2024     Assessment & Plan:  Unintentional weight loss Assessment &  Plan: Etiology and prognosis unclear at this time.  Concern that this is secondary to malignancy.  Referring to GI for colonoscopy urgently.  Obtaining CT chest abdomen pelvis.  Orders: -     CT CHEST ABDOMEN PELVIS WO CONTRAST  Tobacco abuse -     CT CHEST ABDOMEN PELVIS WO CONTRAST  Positive colorectal cancer screening using Cologuard test -     CT CHEST ABDOMEN PELVIS WO CONTRAST  CKD (chronic kidney disease) stage 4, GFR 15-29 ml/min (HCC) Assessment & Plan: Stable.   Hypertension, unspecified type Assessment & Plan: Fair control given advanced age.  Continue current medications.     Follow-up: Pending workup  Donnovan Stamour Bluford DO [facility] "

## 2024-11-17 ENCOUNTER — Ambulatory Visit (HOSPITAL_COMMUNITY)
Admission: RE | Admit: 2024-11-17 | Discharge: 2024-11-17 | Disposition: A | Discharge status: Home / Self Care | Source: Ambulatory Visit | Attending: Family Medicine | Admitting: Family Medicine

## 2024-11-17 DIAGNOSIS — Z720 Tobacco use: Secondary | ICD-10-CM | POA: Insufficient documentation

## 2024-11-17 DIAGNOSIS — R195 Other fecal abnormalities: Secondary | ICD-10-CM | POA: Insufficient documentation

## 2024-11-17 DIAGNOSIS — R634 Abnormal weight loss: Secondary | ICD-10-CM | POA: Insufficient documentation

## 2024-11-19 ENCOUNTER — Other Ambulatory Visit: Payer: Self-pay | Admitting: Family Medicine

## 2024-11-19 ENCOUNTER — Ambulatory Visit: Payer: Self-pay | Admitting: Family Medicine

## 2024-11-19 DIAGNOSIS — E049 Nontoxic goiter, unspecified: Secondary | ICD-10-CM

## 2024-11-25 ENCOUNTER — Telehealth: Payer: Self-pay | Admitting: *Deleted

## 2024-11-25 NOTE — Telephone Encounter (Signed)
 Copied from CRM #[id number]. Topic: General - Other >> Nov 24, 2024 12:12 PM Zebedee SAUNDERS wrote: Reason for CRM: Pt's daughter Harmon,Charlene [phone] call on behalf of pt who has stage kidney disease. Pt would like to schedule for colonoscopy, pt can not take  Na Sulfate-K Sulfate-Mg Sulfate concentrate (SUPREP) 17.5-3.13-1.6 GM/177ML SOLN. Please call Charlene when schedule and called in to [facility] [address] [place] - [address] [place] [address] Phone: [phone] Fax: [phone]

## 2024-11-26 ENCOUNTER — Ambulatory Visit (HOSPITAL_COMMUNITY)
Admission: RE | Admit: 2024-11-26 | Discharge: 2024-11-26 | Disposition: A | Discharge status: Home / Self Care | Source: Ambulatory Visit | Attending: Family Medicine | Admitting: Family Medicine

## 2024-11-26 DIAGNOSIS — E049 Nontoxic goiter, unspecified: Secondary | ICD-10-CM | POA: Diagnosis present

## 2024-11-26 NOTE — Telephone Encounter (Signed)
 Called and left a message for Ms Deborah Gomez to return the call , need to make sure they are in the process of scheduling the colonoscopy

## 2024-11-26 NOTE — Telephone Encounter (Signed)
 Cook, Jayce G, DO     11/25/24  4:20 PM Please make she they call and reschedule colonoscopy.   Jacqulyn Ahle DO [facility]

## 2024-11-27 NOTE — Telephone Encounter (Signed)
 Patients daughter Allena called office, stated she has not schedule the colonoscopy due to patient being unable to take the prep. Advised daughter to call the GI doctor to schedule the colonoscopy and speak with them about other prep options as they would be the ones to prescribe it. Daughter will call [facility] and advised when patient has a colonoscopy appointment schedule.

## 2024-11-29 ENCOUNTER — Ambulatory Visit: Payer: Self-pay | Admitting: Family Medicine

## 2024-12-01 ENCOUNTER — Other Ambulatory Visit: Payer: Self-pay | Admitting: Family Medicine

## 2024-12-01 DIAGNOSIS — E041 Nontoxic single thyroid nodule: Secondary | ICD-10-CM

## 2024-12-01 DIAGNOSIS — E049 Nontoxic goiter, unspecified: Secondary | ICD-10-CM

## 2024-12-01 NOTE — Progress Notes (Signed)
 e

## 2024-12-02 ENCOUNTER — Other Ambulatory Visit: Payer: Self-pay | Admitting: Family Medicine

## 2024-12-02 DIAGNOSIS — E049 Nontoxic goiter, unspecified: Secondary | ICD-10-CM

## 2024-12-09 ENCOUNTER — Ambulatory Visit (HOSPITAL_COMMUNITY): Admission: RE | Admit: 2024-12-09 | Source: Ambulatory Visit

## 2025-03-03 ENCOUNTER — Ambulatory Visit: Admitting: Family Medicine
